# Patient Record
Sex: Male | Born: 1950 | Race: White | Hispanic: No | Marital: Married | Smoking: Current every day smoker
Health system: Southern US, Community
[De-identification: ages and names within clinical notes are randomized; demographics above are authoritative.]

## PROBLEM LIST (undated history)

## (undated) DIAGNOSIS — M1990 Unspecified osteoarthritis, unspecified site: Secondary | ICD-10-CM

## (undated) DIAGNOSIS — I255 Ischemic cardiomyopathy: Secondary | ICD-10-CM

## (undated) DIAGNOSIS — I2119 ST elevation (STEMI) myocardial infarction involving other coronary artery of inferior wall: Secondary | ICD-10-CM

## (undated) DIAGNOSIS — E785 Hyperlipidemia, unspecified: Secondary | ICD-10-CM

## (undated) DIAGNOSIS — J189 Pneumonia, unspecified organism: Secondary | ICD-10-CM

## (undated) DIAGNOSIS — K649 Unspecified hemorrhoids: Secondary | ICD-10-CM

## (undated) DIAGNOSIS — Z951 Presence of aortocoronary bypass graft: Secondary | ICD-10-CM

## (undated) DIAGNOSIS — J45909 Unspecified asthma, uncomplicated: Secondary | ICD-10-CM

## (undated) DIAGNOSIS — E669 Obesity, unspecified: Secondary | ICD-10-CM

## (undated) DIAGNOSIS — I10 Essential (primary) hypertension: Secondary | ICD-10-CM

## (undated) DIAGNOSIS — E66811 Obesity, class 1: Secondary | ICD-10-CM

## (undated) DIAGNOSIS — I2510 Atherosclerotic heart disease of native coronary artery without angina pectoris: Secondary | ICD-10-CM

## (undated) DIAGNOSIS — N200 Calculus of kidney: Secondary | ICD-10-CM

## (undated) HISTORY — DX: Obesity, class 1: E66.811

## (undated) HISTORY — DX: Obesity, unspecified: E66.9

## (undated) HISTORY — DX: Hyperlipidemia, unspecified: E78.5

## (undated) HISTORY — DX: Atherosclerotic heart disease of native coronary artery without angina pectoris: I25.10

## (undated) HISTORY — DX: Ischemic cardiomyopathy: I25.5

## (undated) HISTORY — DX: Unspecified hemorrhoids: K64.9

## (undated) HISTORY — DX: ST elevation (STEMI) myocardial infarction involving other coronary artery of inferior wall: I21.19

## (undated) HISTORY — DX: Presence of aortocoronary bypass graft: Z95.1

## (undated) HISTORY — PX: LITHOTRIPSY: SUR834

---

## 1996-01-21 HISTORY — PX: CORONARY ANGIOPLASTY WITH STENT PLACEMENT: SHX49

## 1999-03-14 DIAGNOSIS — Z951 Presence of aortocoronary bypass graft: Secondary | ICD-10-CM

## 1999-03-14 HISTORY — DX: Presence of aortocoronary bypass graft: Z95.1

## 1999-06-06 ENCOUNTER — Encounter: Payer: Self-pay | Admitting: *Deleted

## 1999-06-06 HISTORY — PX: CARDIAC CATHETERIZATION: SHX172

## 1999-06-07 ENCOUNTER — Encounter: Payer: Self-pay | Admitting: Thoracic Surgery (Cardiothoracic Vascular Surgery)

## 1999-06-07 ENCOUNTER — Inpatient Hospital Stay (HOSPITAL_COMMUNITY): Admission: AD | Admit: 1999-06-07 | Discharge: 1999-06-12 | Payer: Self-pay | Admitting: *Deleted

## 1999-06-07 HISTORY — PX: CORONARY ARTERY BYPASS GRAFT: SHX141

## 1999-06-08 ENCOUNTER — Encounter: Payer: Self-pay | Admitting: Thoracic Surgery (Cardiothoracic Vascular Surgery)

## 1999-06-09 ENCOUNTER — Encounter: Payer: Self-pay | Admitting: Thoracic Surgery (Cardiothoracic Vascular Surgery)

## 1999-06-10 ENCOUNTER — Encounter: Payer: Self-pay | Admitting: Thoracic Surgery (Cardiothoracic Vascular Surgery)

## 2001-10-04 HISTORY — PX: CARDIAC CATHETERIZATION: SHX172

## 2004-08-18 ENCOUNTER — Ambulatory Visit: Payer: Self-pay | Admitting: Urology

## 2004-08-24 ENCOUNTER — Other Ambulatory Visit: Payer: Self-pay

## 2004-08-25 ENCOUNTER — Ambulatory Visit: Payer: Self-pay | Admitting: Urology

## 2006-01-25 ENCOUNTER — Ambulatory Visit (HOSPITAL_COMMUNITY): Admission: RE | Admit: 2006-01-25 | Discharge: 2006-01-25 | Payer: Self-pay | Admitting: Urology

## 2009-08-19 HISTORY — PX: NM MYOVIEW LTD: HXRAD82

## 2012-03-13 DIAGNOSIS — J189 Pneumonia, unspecified organism: Secondary | ICD-10-CM

## 2012-03-13 HISTORY — DX: Pneumonia, unspecified organism: J18.9

## 2012-03-15 ENCOUNTER — Ambulatory Visit: Payer: Self-pay | Admitting: Anesthesiology

## 2012-04-01 ENCOUNTER — Other Ambulatory Visit (HOSPITAL_COMMUNITY): Payer: Self-pay | Admitting: Cardiovascular Disease

## 2012-04-01 DIAGNOSIS — R011 Cardiac murmur, unspecified: Secondary | ICD-10-CM

## 2012-04-01 DIAGNOSIS — I519 Heart disease, unspecified: Secondary | ICD-10-CM

## 2012-05-06 ENCOUNTER — Ambulatory Visit (HOSPITAL_COMMUNITY)
Admission: RE | Admit: 2012-05-06 | Discharge: 2012-05-06 | Disposition: A | Discharge status: Home / Self Care | Payer: BC Managed Care – PPO | Source: Ambulatory Visit | Attending: Cardiovascular Disease | Admitting: Cardiovascular Disease

## 2012-05-06 ENCOUNTER — Encounter (HOSPITAL_COMMUNITY): Payer: Self-pay

## 2012-05-06 DIAGNOSIS — E785 Hyperlipidemia, unspecified: Secondary | ICD-10-CM | POA: Insufficient documentation

## 2012-05-06 DIAGNOSIS — I079 Rheumatic tricuspid valve disease, unspecified: Secondary | ICD-10-CM | POA: Insufficient documentation

## 2012-05-06 DIAGNOSIS — I519 Heart disease, unspecified: Secondary | ICD-10-CM

## 2012-05-06 DIAGNOSIS — F17200 Nicotine dependence, unspecified, uncomplicated: Secondary | ICD-10-CM | POA: Insufficient documentation

## 2012-05-06 DIAGNOSIS — I359 Nonrheumatic aortic valve disorder, unspecified: Secondary | ICD-10-CM | POA: Insufficient documentation

## 2012-05-06 DIAGNOSIS — R011 Cardiac murmur, unspecified: Secondary | ICD-10-CM | POA: Insufficient documentation

## 2012-05-06 HISTORY — PX: TRANSTHORACIC ECHOCARDIOGRAM: SHX275

## 2012-05-06 HISTORY — PX: OTHER SURGICAL HISTORY: SHX169

## 2012-05-06 NOTE — Progress Notes (Signed)
2D Echo Performed 05/06/2012    Cera Rorke, RCS  

## 2012-05-06 NOTE — Progress Notes (Signed)
Carotid duplex complete Maxwell Caul

## 2013-02-10 ENCOUNTER — Telehealth: Payer: Self-pay | Admitting: *Deleted

## 2013-02-10 MED ORDER — RAMIPRIL 5 MG PO CAPS: 5.0000 mg | ORAL_CAPSULE | Freq: Every day | ORAL | Status: DC

## 2013-02-10 NOTE — Telephone Encounter (Signed)
Rx was sent to pharmacy electronically. Left VM with info regarding refill at [facility] - on file in paper chart

## 2013-02-10 NOTE — Telephone Encounter (Signed)
Pt is out of Ramipril 5 mg. Pt wife stated that he is out of his medication and she called the drug store and they told her to call us for a 30 day supply.

## 2013-02-26 ENCOUNTER — Ambulatory Visit: Payer: BC Managed Care – PPO | Admitting: Cardiology

## 2013-03-11 ENCOUNTER — Other Ambulatory Visit: Payer: Self-pay | Admitting: Cardiology

## 2013-03-11 NOTE — Telephone Encounter (Signed)
Rx was sent to pharmacy electronically. 

## 2013-03-14 ENCOUNTER — Encounter: Payer: Self-pay | Admitting: Cardiology

## 2013-03-14 ENCOUNTER — Ambulatory Visit (INDEPENDENT_AMBULATORY_CARE_PROVIDER_SITE_OTHER): Payer: BC Managed Care – PPO | Admitting: Cardiology

## 2013-03-14 VITALS — BP 140/70 | HR 65 | Ht 68.0 in | Wt 218.2 lb

## 2013-03-14 DIAGNOSIS — F17200 Nicotine dependence, unspecified, uncomplicated: Secondary | ICD-10-CM

## 2013-03-14 DIAGNOSIS — Z79899 Other long term (current) drug therapy: Secondary | ICD-10-CM

## 2013-03-14 DIAGNOSIS — I2589 Other forms of chronic ischemic heart disease: Secondary | ICD-10-CM

## 2013-03-14 DIAGNOSIS — E785 Hyperlipidemia, unspecified: Secondary | ICD-10-CM

## 2013-03-14 DIAGNOSIS — I255 Ischemic cardiomyopathy: Secondary | ICD-10-CM

## 2013-03-14 DIAGNOSIS — I2510 Atherosclerotic heart disease of native coronary artery without angina pectoris: Secondary | ICD-10-CM

## 2013-03-14 DIAGNOSIS — F17210 Nicotine dependence, cigarettes, uncomplicated: Secondary | ICD-10-CM

## 2013-03-14 DIAGNOSIS — E669 Obesity, unspecified: Secondary | ICD-10-CM

## 2013-03-14 LAB — LIPID PANEL
CHOLESTEROL: 117 mg/dL (ref 0–200)
HDL: 34 mg/dL — AB (ref >39)
LDL Cholesterol: 60 mg/dL (ref 0–99)
TRIGLYCERIDES: 113 mg/dL (ref <150)
Total CHOL/HDL Ratio: 3.4 Ratio
VLDL: 23 mg/dL (ref 0–40)

## 2013-03-14 LAB — COMPREHENSIVE METABOLIC PANEL
ALT: 20 U/L (ref 0–53)
AST: 14 U/L (ref 0–37)
Albumin: 4.2 g/dL (ref 3.5–5.2)
Alkaline Phosphatase: 72 U/L (ref 39–117)
BUN: 18 mg/dL (ref 6–23)
CALCIUM: 9.2 mg/dL (ref 8.4–10.5)
CHLORIDE: 103 meq/L (ref 96–112)
CO2: 25 meq/L (ref 19–32)
Creat: 0.97 mg/dL (ref 0.50–1.35)
Glucose, Bld: 103 mg/dL — ABNORMAL HIGH (ref 70–99)
Potassium: 4.7 mEq/L (ref 3.5–5.3)
Sodium: 138 mEq/L (ref 135–145)
Total Bilirubin: 0.4 mg/dL (ref 0.3–1.2)
Total Protein: 7.1 g/dL (ref 6.0–8.3)

## 2013-03-14 MED ORDER — RAMIPRIL 5 MG PO CAPS: ORAL_CAPSULE | ORAL | Status: DC

## 2013-03-14 MED ORDER — METOPROLOL TARTRATE 50 MG PO TABS: 25.0000 mg | ORAL_TABLET | Freq: Two times a day (BID) | ORAL | Status: DC

## 2013-03-14 MED ORDER — TAMSULOSIN HCL 0.4 MG PO CAPS: 0.4000 mg | ORAL_CAPSULE | Freq: Every day | ORAL | Status: DC

## 2013-03-14 NOTE — Patient Instructions (Signed)
Continue with current medication.  LABS -CMP,LIPID   Your physician wants you to follow-up in 6 MONTHS Dr Ellyn Hack.  You will receive a reminder letter in the mail two months in advance. If you don't receive a letter, please call our office to schedule the follow-up appointment.

## 2013-03-16 ENCOUNTER — Encounter: Payer: Self-pay | Admitting: Cardiology

## 2013-03-16 DIAGNOSIS — F17210 Nicotine dependence, cigarettes, uncomplicated: Secondary | ICD-10-CM | POA: Insufficient documentation

## 2013-03-16 DIAGNOSIS — I255 Ischemic cardiomyopathy: Secondary | ICD-10-CM | POA: Insufficient documentation

## 2013-03-16 DIAGNOSIS — E669 Obesity, unspecified: Secondary | ICD-10-CM | POA: Insufficient documentation

## 2013-03-16 DIAGNOSIS — E785 Hyperlipidemia, unspecified: Secondary | ICD-10-CM | POA: Insufficient documentation

## 2013-03-16 NOTE — Assessment & Plan Note (Signed)
This may be his Achilles heel. He does not seem to be interested in smoking cessation. I did, several options including patches and the E-cigarettes, versus simply just stopping cold [place]. He seemed to be agreeable to trying to cut down his smoking by half for the next visit. At least 5 minutes was spent talking about this issue.

## 2013-03-16 NOTE — Assessment & Plan Note (Signed)
We talked about adjusting his diet reducing a carbohydrate intake as well as fatty food intake. Mostly simply be reducing his meals. Quite active, but is not getting routine exercise. I recommended based on the weekends he does do some walking.

## 2013-03-16 NOTE — Assessment & Plan Note (Addendum)
Completely a symptomatic from from an angina or heart failure standpoint. On a stable regimen of beta blocker, ACE inhibitor as well as aspirin and statin.  He'll be due for a followup stress test that can be ordered at the time of his next visit. He preferred to have studies done in the wintertime when work is less busy.

## 2013-03-16 NOTE — Assessment & Plan Note (Signed)
He is on Vytorin, has been doing very well since 2001. His most recent set of labs from a year ago demonstrated that he was very well controlled. He is due for routine followup lipid panel and chemistry panel. Otherwise we'll make no changes.

## 2013-03-16 NOTE — Assessment & Plan Note (Signed)
Moderately reduced EF, no signs symptoms of heart failure. He is on a good regimen of beta blocker and ACE inhibitor. Not requiring any diuretic. His EF was mildly reduced on the most recent echocardiogram as compared to 2012, may basically making his EF more consistent with being 45%.

## 2013-03-16 NOTE — Progress Notes (Signed)
PATIENT: Benjamin Robertson MRN: [id number]  DOB: [birth year]   DOV:03/16/2013 PCP: No PCP Per Patient  Clinic Note: Chief Complaint  Patient presents with  . ROV 1 year RAW-DH    No complaints.    HPI: Benjamin Robertson is a 63 y.o.  male with a PMH below who presents today for what amounts to be a one-year followup for cardiology care to reestablish a new cardiologist at the retirement of his former cardiologist Dr. Rollene Fare. Prior to Dr. Rollene Fare, he was a patient of Dr. Tami Ribas. He was last seen in October 2013. He did have an echocardiogram performed in February of 2014 revealing moderately reduced EF of 40-45% which is relatively stable for him.  Interval History: He a relatively well for the last 14 months with no major complaints. He has a little a baseline exertional dyspnea but nothing significant. He denies any active symptoms of angina with rest or exertion. No heart failure symptoms to go along with his low EF including no PND, orthopnea or edema. No lightheadedness, dizziness or wooziness. No rapid or irregular heartbeats/palpitations. No syncope/near syncope or TIA/amaurosis fugax symptoms. No melena, hematochezia or hematuria.  He does a lot of heavy lifting and activity during work, but does not get routine exercise. With all activity, he denies any symptoms.  Past Medical History  Diagnosis Date  . History of ST elevation myocardial infarction (STEMI) of inferior wall 1994; 1997    PCI of RCA - redo PCI BMS to RCA in 1997  . CAD in native artery 1994, 2001    Cath For abnormal Myoview for Exertional Angina -- Referred to Dr. Roxy Manns for CABG: LIMA-LAD, SVG-diagonal, SVG-Ramus Intermedius; patent by cath in 2003  . Ischemic cardiomyopathy 2003    Most recent echo February 2014: EF 40-45%; moderate HK of anterolateral myocardium.; Grade 1 diastolic dysfunction  . Hyperlipidemia LDL goal <70     At goal by Most recent labs October 2013: TC 123, TG 83, HDL 81, LDL 65  . Cigarette smoker one  half pack a day or less      over 40 years  . H/O gastroesophageal reflux (GERD)   . Obesity (BMI 30.0-34.9)     Prior Cardiac Evaluation and Past Surgical History: Past Surgical History  Procedure Laterality Date  . Cardiac catheterization  01/21/1996    PTCA of RCA-95% lesion  . Cardiac catheterization  06/06/1999    Recommend CABG  . Coronary artery bypass graft  06/07/1999    x3. LIMA to distal LAD, SVG to first diag, and SVG to ramus  . Cardiac catheterization  10/04/2001    Patent grafts with a 50% lesion in LAD beyond IMA insertion  . Nm myoview ltd  08/19/2009    Moderafte perfusion seen in Basal inferoseptal, Basal inferior, Mid inferoseptal, Mid inferiorm, and Apical inferior regions. No ECG changes. EKG negative for ischemia.  . Transthoracic echocardiogram  05/06/2012    EF 40-45%, LV cavity moderately dilated, systolic function mild-moderately reduced, moderate hypokinesis of anterolatewral myocardium.  . Carotid doppler  05/06/2012    Less than 50% diameter reduction of the Lft Subclavian.    No Known Allergies  Current Outpatient Prescriptions  Medication Sig Dispense Refill  . aspirin 325 MG tablet Take 325 mg by mouth daily.      Marland Kitchen ezetimibe-simvastatin (VYTORIN) 10-40 MG per tablet Take 1 tablet by mouth daily.      . metoprolol (LOPRESSOR) 50 MG tablet Take 0.5 tablets (25 mg total) by  mouth 2 (two) times daily.  90 tablet  3  . naproxen sodium (ANAPROX) 220 MG tablet Take 220 mg by mouth as needed.      . nitroGLYCERIN (NITROLINGUAL) 0.4 MG/SPRAY spray Place 1 spray under the tongue every 5 (five) minutes x 3 doses as needed for chest pain.      Marland Kitchen Potassium Citrate 15 MEQ (1620 MG) TBCR Take 1 tablet by mouth 2 (two) times daily.      . ramipril (ALTACE) 5 MG capsule TAKE 1 CAPSULE (5 MG TOTAL) BY MOUTH DAILY.  90 capsule  3  . tamsulosin (FLOMAX) 0.4 MG CAPS capsule Take 1 capsule (0.4 mg total) by mouth daily.  90 capsule  3   No current facility-administered  medications for this visit.    History   Social History Narrative   Married father of 2 with one grandchild. By his wife.   Continues to smoke one half pack a day.   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   Very physically active at work but no routine exercise.    ROS: A comprehensive Review of Systems - Negative except Mild arthralgias from osteoarthritis-type discomfort. Mostly his back and hips for which he takes occasional naproxen.  PHYSICAL EXAM BP 140/70  Pulse 65  Ht 5\' 8"  (1.727 m)  Wt 218 lb 3.2 oz (98.975 kg)  BMI 33.18 kg/m2 General appearance: alert and oriented x3, cooperative, appears stated age, no distress and well-nourished and well-groomed. It is questions appropriately. HEENT: [place]/AT, EOMI, MMM, anicteric sclera Neck: no adenopathy, no JVD, supple, symmetrical, trachea midline and soft bilateral bruits. Lungs: clear to auscultation bilaterally, normal percussion bilaterally and increased AP diameter. No increased worker breathing. No B./R./R. Heart: normal apical impulse, regular rate and rhythm and normal S1 and S2. Soft S4. 1/6 SEM at RUSB. No R./G. Abdomen: soft, non-tender; bowel sounds normal; no masses,  no organomegaly Extremities: extremities normal, atraumatic, no cyanosis or edema Pulses: 2+ and symmetric Neurologic: Alert and oriented X 3, normal strength and tone. Normal symmetric reflexes. Normal coordination and gait  WYO:VZCHYIFOY today: Yes Rate:65 , Rhythm: NSR; normal EKG.  Recent Labs: None for the past year.  ASSESSMENT / PLAN: CAD in native artery - status post CABG x3 after PCI x2 to the RCA (LIMA-LAD, SVG-diagonal SVG-RI Completely a symptomatic from from an angina or heart failure standpoint. On a stable regimen of beta blocker, ACE inhibitor as well as aspirin and statin.  He'll be due for a followup stress test that can be ordered at the time of his next visit. He  preferred to have studies done in the wintertime when work is less busy.  Ischemic cardiomyopathy Moderately reduced EF, no signs symptoms of heart failure. He is on a good regimen of beta blocker and ACE inhibitor. Not requiring any diuretic. His EF was mildly reduced on the most recent echocardiogram as compared to 2012, may basically making his EF more consistent with being 45%.  Hyperlipidemia LDL goal <70 He is on Vytorin, has been doing very well since 2001. His most recent set of labs from a year ago demonstrated that he was very well controlled. He is due for routine followup lipid panel and chemistry panel. Otherwise we'll make no changes.  Obesity (BMI 30.0-34.9) We talked about adjusting his diet reducing a carbohydrate intake as well as fatty food intake. Mostly simply be reducing his meals. Quite active, but is not  getting routine exercise. I recommended based on the weekends he does do some walking.  Cigarette smoker one half pack a day or less This may be his Achilles heel. He does not seem to be interested in smoking cessation. I did, several options including patches and the E-cigarettes, versus simply just stopping cold [place]. He seemed to be agreeable to trying to cut down his smoking by half for the next visit. At least 5 minutes was spent talking about this issue.    Orders Placed This Encounter  Procedures  . Lipid panel    Order Specific Question:  Has the patient fasted?    Answer:  Yes  . Comprehensive metabolic panel    Order Specific Question:  Has the patient fasted?    Answer:  Yes  . EKG 12-Lead  . EKG 12-Lead    This order was created through External Result Entry    All cardiac medications refilled for 90 day supply  Followup: 6 months   DAVID W. Ellyn Hack, M.D., M.S. [facility] [address] [place], [place]  [address]  [phone] Pager # [phone]

## 2013-03-28 ENCOUNTER — Telehealth: Payer: Self-pay | Admitting: *Deleted

## 2013-03-28 NOTE — Telephone Encounter (Signed)
Message copied by Raiford Simmonds on Fri Mar 28, 2013 12:10 PM ------      Message from: Leonie Man      Created: Sat Mar 15, 2013  8:50 PM       Total Cholesterol, LDL & Triglycerides - all look great; HDL is low, but likely related to low Total Cholesterol.            Glucose is elevated -- 103; would recommend checking HgbA1c with next labs.  ? Insulin ?Resistance            Leonie Man, MD       ------

## 2013-03-28 NOTE — Telephone Encounter (Signed)
Left message to call back about labs results

## 2013-04-03 NOTE — Telephone Encounter (Signed)
Returning your call from last Friday.

## 2013-04-07 NOTE — Telephone Encounter (Signed)
SPOKE TO WIFE RESULTS GIVEN, PER DR HARDING- RECOMMEND HGA1C at his next lab work. Wife states patient does not have a PCP. She will try to get him to go to her  PCP at [facility].

## 2013-04-09 ENCOUNTER — Telehealth: Payer: Self-pay | Admitting: Cardiology

## 2013-04-09 NOTE — Telephone Encounter (Signed)
Wife wanted to know if a letter can be written for Benjamin Robertson for jury duty on 05/22/13.  Wife states in the past he has received a letter. She states he becomes very anxious situation. Informed wife that will have to defer to Dr Ellyn Hack---- And will call her back

## 2013-04-09 NOTE — Telephone Encounter (Signed)
Need to speak to you about getting him out of jury duty , because he has extreme anxiety  .Marland Kitchen When he has to go to these . Please Call

## 2013-04-10 NOTE — Telephone Encounter (Signed)
Spoke to wife.informed her that Dr Ellyn Hack will not be able to do a letter

## 2013-04-10 NOTE — Telephone Encounter (Signed)
I would love to help - but I think this is something better suited for a PCP.  I don't think anxiety gets you out of jury duty - I wish it could.  His PCP should have my latest note to reference.  Leonie Man, MD

## 2013-05-26 ENCOUNTER — Telehealth: Payer: Self-pay | Admitting: Cardiology

## 2013-05-26 MED ORDER — EZETIMIBE-SIMVASTATIN 10-40 MG PO TABS: 1.0000 | ORAL_TABLET | Freq: Every day | ORAL | Status: DC

## 2013-05-26 NOTE — Telephone Encounter (Signed)
He needs a new prescription for Vytorin 10/40 #90

## 2013-05-26 NOTE — Telephone Encounter (Signed)
Rx was sent to pharmacy electronically. 

## 2013-10-14 ENCOUNTER — Telehealth: Payer: Self-pay | Admitting: Cardiology

## 2013-10-14 ENCOUNTER — Other Ambulatory Visit: Payer: Self-pay | Admitting: *Deleted

## 2013-10-14 MED ORDER — EZETIMIBE-SIMVASTATIN 10-40 MG PO TABS: 1.0000 | ORAL_TABLET | Freq: Every day | ORAL | Status: DC

## 2013-10-14 NOTE — Telephone Encounter (Signed)
[name] is calling about [name] 10/140mg  .. And its in a Tier 3 and its has gone up very high in cost and wanting to know if there is something else that he could take . Please call  Thanks

## 2013-10-14 NOTE — Telephone Encounter (Signed)
Returned a call to patient's wife. She  Informs me that the cost of the patient's vytorin for a 90 day supply will be going up drastically. Requests alternative. Informed her that I can give  her a [employer] card where they will possibly only pay  $25/month. She requests for me to mail it to their home address. She will read it and try to see if they can use it. If not she will call back to advise and get further recommendations.

## 2013-10-20 ENCOUNTER — Telehealth: Payer: Self-pay | Admitting: Cardiology

## 2013-10-20 NOTE — Telephone Encounter (Signed)
New Prob    Requesting prior authorization or medication alternative for Vytorin. Please call.

## 2013-10-20 NOTE — Telephone Encounter (Signed)
Discussed with the pharm, called and spoke with the pts wife. If we split the vytorin into two separate scripts the zetia would be $35 and the simvastatin would be their generic co-pay. The pts wife reports they have enough vytorin to last until his appt with dr harding. They will wait and discuss at appt time. Will make the pharm aware

## 2013-10-21 ENCOUNTER — Telehealth: Payer: Self-pay | Admitting: *Deleted

## 2013-10-21 NOTE — Telephone Encounter (Signed)
RN spoke to wife.  RN attempted --- prior authorization for Vytorin.  patient has tried LIPITOR/ATORVASTATIN, ZOCOR/ SIMVASTATIN, ADIVCOR past the last 2 has intolerance with taking by themselves. Patient has been on [place] 2/ 2006 WITH OUT ANY PROBLEMS.  PER RX OPTIONS  REP. Hassell Done) patient has to try and failed atorvastatin, pravastatin, lovastatin, simvastatin (step therapy) before vytorin is considered. Rep. did not have a number to get paper form. RN called patient.  Patient at work , spoke to wife. RN informed wife of situation.possible solution could be to split medication two separate pills - to see the cost factor. She states patient has enough medication until next appointment with Dr Ellyn Hack. She states they will discuss it with him at that time.  NOTIFED PHARMACY TO PLACE REORDER ON HOLD (SPOKE TO PHARM ?BENA)

## 2013-10-27 NOTE — Telephone Encounter (Signed)
Will look to discuss this in clinic.  Leonie Man, MD

## 2013-12-11 ENCOUNTER — Encounter: Payer: Self-pay | Admitting: Cardiology

## 2013-12-11 ENCOUNTER — Ambulatory Visit (INDEPENDENT_AMBULATORY_CARE_PROVIDER_SITE_OTHER): Payer: BC Managed Care – PPO | Admitting: Cardiology

## 2013-12-11 VITALS — BP 130/74 | HR 75 | Ht 68.0 in | Wt 216.4 lb

## 2013-12-11 DIAGNOSIS — I255 Ischemic cardiomyopathy: Secondary | ICD-10-CM

## 2013-12-11 DIAGNOSIS — Z79899 Other long term (current) drug therapy: Secondary | ICD-10-CM

## 2013-12-11 DIAGNOSIS — E785 Hyperlipidemia, unspecified: Secondary | ICD-10-CM

## 2013-12-11 DIAGNOSIS — I2510 Atherosclerotic heart disease of native coronary artery without angina pectoris: Secondary | ICD-10-CM

## 2013-12-11 DIAGNOSIS — I10 Essential (primary) hypertension: Secondary | ICD-10-CM

## 2013-12-11 DIAGNOSIS — E669 Obesity, unspecified: Secondary | ICD-10-CM

## 2013-12-11 DIAGNOSIS — F17210 Nicotine dependence, cigarettes, uncomplicated: Secondary | ICD-10-CM

## 2013-12-11 DIAGNOSIS — E66811 Obesity, class 1: Secondary | ICD-10-CM

## 2013-12-11 DIAGNOSIS — I2119 ST elevation (STEMI) myocardial infarction involving other coronary artery of inferior wall: Secondary | ICD-10-CM

## 2013-12-11 MED ORDER — SIMVASTATIN 40 MG PO TABS: 40.0000 mg | ORAL_TABLET | Freq: Every day | ORAL | Status: DC

## 2013-12-11 NOTE — Patient Instructions (Addendum)
Your physician has requested that you have a lexiscan myoview. For further information please visit [email or web address]. Please follow instruction sheet, as given. SCHEDULE FOR JAN 2016  STOP VYTORIN- DUE TO INCREASE OF COST WITH INSURANCE   START SIMVASTATN  LABS IN JAN 2016-CMP ,LIPID  Your physician wants you to follow-up in MARCH 2016.  You will receive a reminder letter in the mail two months in advance. If you don't receive a letter, please call our office to schedule the follow-up appointment.

## 2013-12-13 ENCOUNTER — Encounter: Payer: Self-pay | Admitting: Cardiology

## 2013-12-13 DIAGNOSIS — I444 Left anterior fascicular block: Secondary | ICD-10-CM | POA: Insufficient documentation

## 2013-12-13 DIAGNOSIS — I10 Essential (primary) hypertension: Secondary | ICD-10-CM | POA: Insufficient documentation

## 2013-12-13 DIAGNOSIS — I2119 ST elevation (STEMI) myocardial infarction involving other coronary artery of inferior wall: Secondary | ICD-10-CM | POA: Insufficient documentation

## 2013-12-13 NOTE — Assessment & Plan Note (Signed)
He seems to be relatively asymptomatic except for some mild exertional dyspnea. With his last stress testing 4 years ago, but to recheck surveillance Myoview prior to his followup visit in 6 months to ensure there's no progression of disease or in-stent stenosis in the RCA. Otherwise he is on statin beta blocker and ACE inhibitor as well as aspirin. We can reduce his aspirin to 81 mg or one half of a 325 mg tablet.  Plan: LexiScan Myoview -- unable to walk on treadmill for previous stress tests.

## 2013-12-13 NOTE — Assessment & Plan Note (Signed)
Stable weights. Again discussed importance of dietary modification and continued exercise.

## 2013-12-13 NOTE — Assessment & Plan Note (Signed)
I talked for several minutes today about the importance of smoking cessation. He seemed a bit lip service to allow saying that did not seem overly interested in prostate quitting. He half heartedly agreed that he would cut that back.

## 2013-12-13 NOTE — Progress Notes (Signed)
PCP: No PCP Per Patient  Clinic Note: Chief Complaint  Patient presents with  . 9 MONTH VISIT    NO CHEST PAIN , NO SOB , NO EDEMA--ISSUEWITH GETTING BRAND CHOLESTEROL MED    HPI: Benjamin Robertson is a 63 y.o. male with a PMH below who presents today for delayed six-month followup for CAD with moderate ischemic cardiomyopathy. He is a former patient of Dr. Tami Ribas from the time of his initial MI in 63. who then saw Dr. Terance Ice until his retirement. I saw him for the first time in January of 2015. He had a second MI in '97, but finally was referred for CABG in 2001. His last stress test was in 2011 that did not show evidence of ischemia. There was evidence of a inferior infarct, but no ischemia.  Past Medical History  Diagnosis Date  . History of ST elevation myocardial infarction (STEMI) of inferior wall 1994; 1997    PCI of RCA - redo PCI BMS ML Vision 3.0 mm x 25 mm to RCA in 1997  . CAD in native artery 1994, 2001    a) 2001 Exertional Angina --> Myoview: Inf infarct and Ant ischemia --> Cath: 95% pLAD, 99% D1, 80% RI, but Cx-OM1&2 OK , & patent RCA stent --> CABG: b) Cath in 2003 patent grafts, RCA & Cx; c) 08/2009 Myoview: Mod Basal-Mid inferoseptal, Basal-apical inferior Infarct. No ischemia.   . S/P CABG x 3 2001    LIMA-LAD, SVG-diagonal, SVG-Ramus Intermedius (RI) - patent by Cath in 2003  . Ischemic cardiomyopathy 1994, 2003    Echo February 2014: EF 40-45%; moderate HK of anterolateral myocardium.; Grade 1 diastolic dysfunction  . Cigarette smoker one half pack a day or less      over 40 years  . Hyperlipidemia LDL goal <70     At goal by Most recent labs October 2013: TC 123, TG 83, HDL 81, LDL 65  . Obesity (BMI 30.0-34.9)   . H/O gastroesophageal reflux (GERD)    Interval History: DD presents today as well no major complaints. 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 He's out in the heat all day. After being in the heat for the whole day  he may feel a little bit tired and may have to stop to catch his breath some but does not have any sensation of exertional anginal chest tightness or pressure. No resting dyspnea but does have some mild exertional dyspnea. He doesn't really get any routine exercise is always on the go at work. No PND, orthopnea or edema. No palpitations, lightheadedness, dizziness, weakness or syncope/near syncope.  On side note, he just got a letter from his insurance company stating that his Vytorin is now to start costing a significant amount more than it had in the past. He continues to smoke about half pack a day and does not seem to be overly interested in stopping.  ROS: A comprehensive Review of Systems - was performed Review of Systems  Constitutional: Negative.   HENT: Negative for congestion and nosebleeds.   Eyes: Negative for blurred vision and double vision.  Respiratory: Negative for cough, hemoptysis, sputum production, shortness of breath and wheezing.   Cardiovascular: Negative.  Negative for claudication.       Otherwise negative per history of present illness  Gastrointestinal: Negative for blood in stool and melena.  Genitourinary: Negative for hematuria and flank pain.  Musculoskeletal:       Normal expected arthralgias.  Neurological: Negative for dizziness, sensory change, speech change, focal weakness, seizures and loss of consciousness.  Endo/Heme/Allergies: Negative.  Does not bruise/bleed easily.  Psychiatric/Behavioral: Negative for depression. The patient is not nervous/anxious.   All other systems reviewed and are negative.   Current Outpatient Prescriptions on File Prior to Visit  Medication Sig Dispense Refill  . aspirin 325 MG tablet Take 325 mg by mouth daily.      . metoprolol (LOPRESSOR) 50 MG tablet Take 0.5 tablets (25 mg total) by mouth 2 (two) times daily.  90 tablet  3  . naproxen sodium (ANAPROX) 220 MG tablet Take 220 mg by mouth as needed.      . nitroGLYCERIN  (NITROLINGUAL) 0.4 MG/SPRAY spray Place 1 spray under the tongue every 5 (five) minutes x 3 doses as needed for chest pain.      Marland Kitchen Potassium Citrate 15 MEQ (1620 MG) TBCR Take 1 tablet by mouth 2 (two) times daily.      . ramipril (ALTACE) 5 MG capsule TAKE 1 CAPSULE (5 MG TOTAL) BY MOUTH DAILY.  90 capsule  3  . tamsulosin (FLOMAX) 0.4 MG CAPS capsule Take 1 capsule (0.4 mg total) by mouth daily.  90 capsule  3   No current facility-administered medications on file prior to visit.   ALLERGIES REVIEWED IN EPIC -- No change SOCIAL AND FAMILY HISTORY REVIEWED IN EPIC -- No change PSH REVIEWED IN EPIC  Wt Readings from Last 3 Encounters:  12/11/13 216 lb 6.4 oz (98.158 kg)  03/14/13 218 lb 3.2 oz (98.975 kg)    PHYSICAL EXAM BP 130/74  Pulse 75  Ht 5\' 8"  (1.727 m)  Wt 216 lb 6.4 oz (98.158 kg)  BMI 32.91 kg/m2 General appearance: alert and oriented x3, cooperative, appears stated age, no distress and well-nourished and well-groomed. It is questions appropriately.  HEENT: [place]/AT, EOMI, MMM, anicteric sclera  Neck: no adenopathy, no JVD, supple, symmetrical, trachea midline and soft bilateral bruits.  Lungs: CTAB, normal percussion bilaterally and increased AP diameter. No increased worker breathing. NoW./R./R.  Heart: normal apical impulse, RRR, normal S1 and S2. Soft S4. 1/6 SEM at RUSB. No R./G.  Abdomen: soft, non-tender; bowel sounds normal; no masses, no organomegaly, no HJR Extremities: extremities normal, atraumatic, no cyanosis or edema  Pulses: 2+ and symmetric  Neurologic: Alert and oriented X 3, normal strength and tone. Grossly normal   Adult ECG Report  Rate: 75 ;  Rhythm: normal sinus rhythm with PVCs  Narrative Interpretation: stable EKG  Recent Labs:  None since January. At that time his lipid panel was quite well controlled.   TC 117, TG 113, HDL 34, LDL 60 ; glucose 103   ASSESSMENT / PLAN: CAD in native artery - status post CABG x3 after PCI x2 to the RCA  (LIMA-LAD, SVG-diagonal SVG-RI He seems to be relatively asymptomatic except for some mild exertional dyspnea. With his last stress testing 4 years ago, but to recheck surveillance Myoview prior to his followup visit in 6 months to ensure there's no progression of disease or in-stent stenosis in the RCA. Otherwise he is on statin beta blocker and ACE inhibitor as well as aspirin. We can reduce his aspirin to 81 mg or one half of a 325 mg tablet.  Plan: LexiScan Myoview -- unable to walk on treadmill for previous stress tests.  History of ST elevation myocardial infarction (STEMI) of inferior wall For someone who has had 2 MIs in the past, he is quite familiar with  his anginal symptoms. He does have confirmed infarct on Myoview. There is likely to be mild peri-infarct ischemia. I will only react to a significant ischemic burden on Myoview.  Ischemic cardiomyopathy Only moderately reduced EF by echocardiogram. He is on a good dose of beta blocker and ACE inhibitor. No heart failure symptoms. No requirement for diuretics.  Cigarette smoker one half pack a day or less I talked for several minutes today about the importance of smoking cessation. He seemed a bit lip service to allow saying that did not seem overly interested in prostate quitting. He half heartedly agreed that he would cut that back.  Hyperlipidemia LDL goal <70 Currently on Vytorin. That seems now being the issue with his insurance. We will recheck his lipids and chemistry panel. Depending on how closely they ago switched to just simvastatin alone. If not, we may need to change into a more potent statin.   Essential hypertension Relatively well-controlled on moderate dose of beta blocker and ACE inhibitor.  Obesity (BMI 30.0-34.9) Stable weights. Again discussed importance of dietary modification and continued exercise.    Orders Placed This Encounter  Procedures  . Lipid panel    Standing Status: Future     Number of  Occurrences:      Standing Expiration Date: 12/12/2014    Order Specific Question:  Has the patient fasted?    Answer:  Yes  . Comprehensive metabolic panel    Standing Status: Future     Number of Occurrences:      Standing Expiration Date: 12/12/2014    Order Specific Question:  Has the patient fasted?    Answer:  Yes  . Myocardial Perfusion Imaging    Standing Status: Future     Number of Occurrences:      Standing Expiration Date: 12/11/2014    Order Specific Question:  Where should this test be performed    Answer:  [facility]    Order Specific Question:  Type of stress    Answer:  Lexiscan    Order Specific Question:  Patient weight in lbs    Answer:  216  . EKG 12-Lead   Meds ordered this encounter  Medications  . simvastatin (ZOCOR) 40 MG tablet    Sig: Take 1 tablet (40 mg total) by mouth at bedtime.    Dispense:  90 tablet    Refill:  3   Followup: 6 months (after Myoview)   Leonie Man, M.D., M.S. Interventional Cardiologist   Pager # [phone]

## 2013-12-13 NOTE — Assessment & Plan Note (Signed)
Relatively well-controlled on moderate dose of beta blocker and ACE inhibitor.

## 2013-12-13 NOTE — Assessment & Plan Note (Signed)
Only moderately reduced EF by echocardiogram. He is on a good dose of beta blocker and ACE inhibitor. No heart failure symptoms. No requirement for diuretics.

## 2013-12-13 NOTE — Assessment & Plan Note (Signed)
For someone who has had 2 MIs in the past, he is quite familiar with his anginal symptoms. He does have confirmed infarct on Myoview. There is likely to be mild peri-infarct ischemia. I will only react to a significant ischemic burden on Myoview.

## 2013-12-13 NOTE — Assessment & Plan Note (Signed)
Currently on Vytorin. That seems now being the issue with his insurance. We will recheck his lipids and chemistry panel. Depending on how closely they ago switched to just simvastatin alone. If not, we may need to change into a more potent statin.

## 2013-12-26 ENCOUNTER — Encounter (HOSPITAL_COMMUNITY): Payer: BC Managed Care – PPO

## 2014-03-19 ENCOUNTER — Telehealth: Payer: Self-pay | Admitting: *Deleted

## 2014-03-19 DIAGNOSIS — E785 Hyperlipidemia, unspecified: Secondary | ICD-10-CM

## 2014-03-19 DIAGNOSIS — Z79899 Other long term (current) drug therapy: Secondary | ICD-10-CM

## 2014-03-19 NOTE — Telephone Encounter (Signed)
Mail and letter cmp ,lipid  to due in  FEB 2016

## 2014-03-19 NOTE — Telephone Encounter (Signed)
-----   Message from Raiford Simmonds, RN sent at 12/11/2013 11:00 AM EDT ----- LABS CMP [facility] Cirri MAIL END OF JAN 2016 DUE IN FEB 2016

## 2014-03-20 ENCOUNTER — Encounter (HOSPITAL_COMMUNITY): Payer: Self-pay | Admitting: *Deleted

## 2014-03-22 ENCOUNTER — Other Ambulatory Visit: Payer: Self-pay | Admitting: Cardiology

## 2014-03-23 NOTE — Telephone Encounter (Signed)
Rx refill sent to patient pharmacy   

## 2014-03-24 ENCOUNTER — Encounter (HOSPITAL_COMMUNITY): Payer: BC Managed Care – PPO

## 2014-03-27 ENCOUNTER — Telehealth (HOSPITAL_COMMUNITY): Payer: Self-pay

## 2014-03-27 NOTE — Telephone Encounter (Signed)
Encounter complete. 

## 2014-04-01 ENCOUNTER — Encounter (HOSPITAL_COMMUNITY): Payer: BLUE CROSS/BLUE SHIELD

## 2014-04-05 ENCOUNTER — Other Ambulatory Visit: Payer: Self-pay | Admitting: Cardiology

## 2014-04-05 NOTE — Telephone Encounter (Signed)
Rx(s) sent to pharmacy electronically.  

## 2014-04-08 LAB — COMPREHENSIVE METABOLIC PANEL
ALBUMIN: 4 g/dL (ref 3.5–5.2)
ALK PHOS: 64 U/L (ref 39–117)
ALT: 16 U/L (ref 0–53)
AST: 13 U/L (ref 0–37)
BUN: 17 mg/dL (ref 6–23)
CO2: 23 mEq/L (ref 19–32)
Calcium: 9.3 mg/dL (ref 8.4–10.5)
Chloride: 107 mEq/L (ref 96–112)
Creat: 0.97 mg/dL (ref 0.50–1.35)
Glucose, Bld: 101 mg/dL — ABNORMAL HIGH (ref 70–99)
Potassium: 4.8 mEq/L (ref 3.5–5.3)
SODIUM: 139 meq/L (ref 135–145)
TOTAL PROTEIN: 6.6 g/dL (ref 6.0–8.3)
Total Bilirubin: 0.4 mg/dL (ref 0.2–1.2)

## 2014-04-09 ENCOUNTER — Encounter: Payer: Self-pay | Admitting: *Deleted

## 2014-07-06 ENCOUNTER — Encounter: Payer: Self-pay | Admitting: Cardiology

## 2014-07-06 ENCOUNTER — Ambulatory Visit (INDEPENDENT_AMBULATORY_CARE_PROVIDER_SITE_OTHER): Payer: BLUE CROSS/BLUE SHIELD | Admitting: Cardiology

## 2014-07-06 VITALS — BP 124/72 | HR 61 | Ht 68.0 in | Wt 216.6 lb

## 2014-07-06 DIAGNOSIS — I2510 Atherosclerotic heart disease of native coronary artery without angina pectoris: Secondary | ICD-10-CM | POA: Diagnosis not present

## 2014-07-06 DIAGNOSIS — E785 Hyperlipidemia, unspecified: Secondary | ICD-10-CM | POA: Diagnosis not present

## 2014-07-06 DIAGNOSIS — F17210 Nicotine dependence, cigarettes, uncomplicated: Secondary | ICD-10-CM | POA: Diagnosis not present

## 2014-07-06 DIAGNOSIS — E669 Obesity, unspecified: Secondary | ICD-10-CM

## 2014-07-06 DIAGNOSIS — I2119 ST elevation (STEMI) myocardial infarction involving other coronary artery of inferior wall: Secondary | ICD-10-CM

## 2014-07-06 DIAGNOSIS — I10 Essential (primary) hypertension: Secondary | ICD-10-CM | POA: Diagnosis not present

## 2014-07-06 DIAGNOSIS — I255 Ischemic cardiomyopathy: Secondary | ICD-10-CM

## 2014-07-06 LAB — COMPREHENSIVE METABOLIC PANEL
ALK PHOS: 72 U/L (ref 39–117)
ALT: 18 U/L (ref 0–53)
AST: 16 U/L (ref 0–37)
Albumin: 4.4 g/dL (ref 3.5–5.2)
BUN: 22 mg/dL (ref 6–23)
CHLORIDE: 104 meq/L (ref 96–112)
CO2: 26 mEq/L (ref 19–32)
Calcium: 9.6 mg/dL (ref 8.4–10.5)
Creat: 1 mg/dL (ref 0.50–1.35)
Glucose, Bld: 108 mg/dL — ABNORMAL HIGH (ref 70–99)
POTASSIUM: 5.1 meq/L (ref 3.5–5.3)
SODIUM: 138 meq/L (ref 135–145)
TOTAL PROTEIN: 7.2 g/dL (ref 6.0–8.3)
Total Bilirubin: 0.5 mg/dL (ref 0.2–1.2)

## 2014-07-06 LAB — LIPID PANEL
CHOL/HDL RATIO: 4.1 ratio
Cholesterol: 148 mg/dL (ref 0–200)
HDL: 36 mg/dL — ABNORMAL LOW (ref >=40)
LDL Cholesterol: 86 mg/dL (ref 0–99)
TRIGLYCERIDES: 129 mg/dL (ref <150)
VLDL: 26 mg/dL (ref 0–40)

## 2014-07-06 MED ORDER — SIMVASTATIN 40 MG PO TABS: 40.0000 mg | ORAL_TABLET | Freq: Every day | ORAL | Status: DC

## 2014-07-06 NOTE — Progress Notes (Signed)
PCP: No PCP Per Patient  Clinic Note: Chief Complaint  Patient presents with  . ROV 6 months    NO swelling, chest pain.   . Coronary Artery Disease    HPI: Benjamin Robertson is a 64 y.o. male with a PMH below who presents today for ~6 month followup for CAD with moderate ischemic cardiomyopathy. He is a former patient of Dr. Tami Ribas from the time of his initial MI in 22. who then saw Dr. Terance Ice until his retirement. I saw him for the first time in January of 2015. He had a second MI in '97, but finally was referred for CABG in 2001. His last stress test was in 2011 that did not show evidence of ischemia. There was evidence of a inferior infarct, but no ischemia.  Was supposed to have re-look Myoview after last visit --> Insurance rejected due to "added charges" (due to imaging being Hospital Based).  Past Medical History  Diagnosis Date  . History of ST elevation myocardial infarction (STEMI) of inferior wall 1994; 1997    PCI of RCA - redo PCI BMS ML Vision 3.0 mm x 25 mm to RCA in 1997  . CAD in native artery 1994, 2001    a) 2001 Exertional Angina --> Myoview: Inf infarct and Ant ischemia --> Cath: 95% pLAD, 99% D1, 80% RI, but Cx-OM1&2 OK , & patent RCA stent --> CABG: b) Cath in 2003 patent grafts, RCA & Cx; c) 08/2009 Myoview: Mod Basal-Mid inferoseptal, Basal-apical inferior Infarct. No ischemia.   . S/P CABG x 3 2001    LIMA-LAD, SVG-diagonal, SVG-Ramus Intermedius (RI) - patent by Cath in 2003  . Ischemic cardiomyopathy 1994, 2003    Echo February 2014: EF 40-45%; moderate HK of anterolateral myocardium.; Grade 1 diastolic dysfunction  . Cigarette smoker one half pack a day or less      over 40 years  . Hyperlipidemia LDL goal <70     At goal by Most recent labs October 2013: TC 123, TG 83, HDL 81, LDL 65  . Obesity (BMI 30.0-34.9)   . H/O gastroesophageal reflux (GERD)    Past Surgical History  Procedure Laterality Date  . Cardiac catheterization  01/21/1996   PTCA of RCA-95% lesion  . Cardiac catheterization  06/06/1999    Recommend CABG  . Coronary artery bypass graft  06/07/1999    x3. LIMA to distal LAD, SVG to first diag, and SVG to ramus  . Cardiac catheterization  10/04/2001    Patent grafts with a 50% lesion in LAD beyond IMA insertion  . Nm myoview ltd  08/19/2009    Moderafte perfusion seen in Basal inferoseptal, Basal inferior, Mid inferoseptal, Mid inferiorm, and Apical inferior regions. No ECG changes. EKG negative for ischemia.  . Transthoracic echocardiogram  05/06/2012    EF 40-45%, LV cavity moderately dilated, systolic function mild-moderately reduced, moderate hypokinesis of anterolatewral myocardium.  . Carotid doppler  05/06/2012    Less than 50% diameter reduction of the Lft Subclavian.    Interval History:   Doing well since last visit.  Staying active - works 12 hr / day.  Cardiovascular ROS: no chest pain or dyspnea on exertion positive for - heat intolerance dueing summer months negative for - edema, irregular heartbeat, loss of consciousness, murmur, orthopnea, palpitations, paroxysmal nocturnal dyspnea, rapid heart rate, shortness of breath or TIA/Amaurosis Fugax, syncope / near syncope.   Changed to Simvastatin without Zetia last visit.  He continues to smoke about half  pack a day and does not seem to be overly interested in stopping.  ROS: A comprehensive Review of Systems - was performed Review of Systems  Constitutional: Negative.   HENT: Negative for nosebleeds.   Eyes: Negative.   Respiratory: Negative for cough (Rarely wears his allergies.) and sputum production.   Cardiovascular: Negative for claudication.       Per history of present illness  Gastrointestinal: Negative for blood in stool and melena.  Genitourinary: Negative for hematuria.  Musculoskeletal: Positive for joint pain (Knees).       No cramping  Neurological: Negative for dizziness.  Psychiatric/Behavioral: Negative.   All other systems  reviewed and are negative.   Current Outpatient Prescriptions on File Prior to Visit  Medication Sig Dispense Refill  . aspirin 325 MG tablet Take 325 mg by mouth daily.    . metoprolol (LOPRESSOR) 50 MG tablet TAKE 1/2 TABLET BY MOUTH 2 TIMES A DAY 90 tablet 2  . naproxen sodium (ANAPROX) 220 MG tablet Take 220 mg by mouth as needed.    . nitroGLYCERIN (NITROLINGUAL) 0.4 MG/SPRAY spray Place 1 spray under the tongue every 5 (five) minutes x 3 doses as needed for chest pain.    . ramipril (ALTACE) 5 MG capsule TAKE ONE CAPSULE EVERY DAY 90 capsule 2  . tamsulosin (FLOMAX) 0.4 MG CAPS capsule Take 1 capsule (0.4 mg total) by mouth daily. 90 capsule 3   No current facility-administered medications on file prior to visit.   also taking simvastatin 40 mg daily No Known Allergies  History  Substance Use Topics  . Smoking status: Current Every Day Smoker -- 0.50 packs/day for 40 years    Types: Cigarettes  . Smokeless tobacco: Never Used  . Alcohol Use: No   History reviewed. No pertinent family history.   Wt Readings from Last 3 Encounters:  07/06/14 216 lb 9.6 oz (98.249 kg)  12/11/13 216 lb 6.4 oz (98.158 kg)  03/14/13 218 lb 3.2 oz (98.975 kg)   PHYSICAL EXAM BP 124/72 mmHg  Pulse 61  Ht 5\' 8"  (1.727 m)  Wt 216 lb 9.6 oz (98.249 kg)  BMI 32.94 kg/m2 General appearance: alert and oriented x3, cooperative, appears stated age, no distress and well-nourished and well-groomed. It is questions appropriately.  HEENT: [place]/AT, EOMI, MMM, anicteric sclera  Neck: no adenopathy, no JVD, supple, symmetrical, trachea midline and soft bilateral bruits.  Lungs: CTAB, normal percussion bilaterally and increased AP diameter. No increased worker breathing. NoW./R./R.  Heart: normal apical impulse, RRR, normal S1 and S2. Soft S4. 1/6 SEM at RUSB. No R./G.  Abdomen: soft, non-tender; bowel sounds normal; no masses, no organomegaly, no HJR Extremities: extremities normal, atraumatic, no cyanosis  or edema  Pulses: 2+ and symmetric  Neurologic: Alert and oriented X 3, normal strength and tone. Grossly normal   Adult ECG Report  Rate: 75 ;  Rhythm: normal sinus rhythm with PVCs  Narrative Interpretation: stable EKG  Recent Labs:  ? Not sure what happened to October Labs (not in computer)  Lab Results  Component Value Date   CHOL 117 03/14/2013   HDL 34* 03/14/2013   LDLCALC 60 03/14/2013   TRIG 113 03/14/2013   CHOLHDL 3.4 03/14/2013     ASSESSMENT / PLAN: Problem List Items Addressed This Visit    CAD in native artery - status post CABG x3 after PCI x2 to the RCA (LIMA-LAD, SVG-diagonal SVG-RI (Chronic)    Relatively stable with no active symptoms. Plan was for him to  have a followup Myoview Stress Test last fall, but for some reason the insurance company refused to clear the test.   This actually makes no sense to me since he is well within the time frame for the checking his stress test. I think in another year since his symptoms but we'll try again next year.  He is on aspirin, beta blocker ACE inhibitor, but and simvastatin..      Relevant Medications   simvastatin (ZOCOR) 40 MG tablet   Other Relevant Orders   EKG 12-Lead   Comprehensive metabolic panel   Lipid panel   Cigarette smoker one half pack a day or less (Chronic)    Still @ 1/2 PPD - no plans to quit. Was unsuccessful quitting before.   Smoking cessation instruction/counseling given:  counseled patient on the dangers of tobacco use, advised patient to stop smoking, and reviewed strategies to maximize success      Relevant Orders   EKG 12-Lead   Comprehensive metabolic panel   Lipid panel   Essential hypertension (Chronic)    Well-controlled on beta blocker and ACE inhibitor.      Relevant Medications   simvastatin (ZOCOR) 40 MG tablet   Other Relevant Orders   EKG 12-Lead   Comprehensive metabolic panel   Lipid panel   History of ST elevation myocardial infarction (STEMI) of inferior wall -  Primary (Chronic)    His MI was in 1997. No further heart failure symptoms since his CABG. He is a mildly reduced EF but no signs of heart failure. Clear evidence of infarct by Myoview.      Relevant Medications   simvastatin (ZOCOR) 40 MG tablet   Other Relevant Orders   EKG 12-Lead   Comprehensive metabolic panel   Lipid panel   Hyperlipidemia with target LDL less than 70 (Chronic)    Previously well controlled on Vytorin. Now back on simvastatin alone. For some reason I did not get his labs from the fall but when checked elsewhere. I have ordered for his labs to be checked today.      Relevant Medications   simvastatin (ZOCOR) 40 MG tablet   Other Relevant Orders   EKG 12-Lead   Comprehensive metabolic panel   Lipid panel   Obesity (BMI 30.0-34.9) (Chronic)    The patient understands the need to lose weight with diet and exercise. We have discussed specific strategies for this.       Relevant Orders   EKG 12-Lead   Comprehensive metabolic panel   Lipid panel       Followup: 6 months    Brytney Somes, Leonie Green, M.D., M.S. Interventional Cardiologist   Pager # [phone]

## 2014-07-06 NOTE — Assessment & Plan Note (Signed)
The patient understands the need to lose weight with diet and exercise. We have discussed specific strategies for this.  

## 2014-07-06 NOTE — Assessment & Plan Note (Addendum)
His MI was in 1997. No further heart failure symptoms since his CABG. He is a mildly reduced EF but no signs of heart failure. Clear evidence of infarct by Myoview.

## 2014-07-06 NOTE — Patient Instructions (Signed)
LABS - CMP, LIPID TODAY.  NO CHANGE WITH MEDICATIONS  Your physician wants you to follow-up in [place]. You will receive a reminder letter in the mail two months in advance. If you don't receive a letter, please call our office to schedule the follow-up appointment.

## 2014-07-06 NOTE — Assessment & Plan Note (Signed)
Relatively stable with no active symptoms. Plan was for him to have a followup Myoview Stress Test last fall, but for some reason the insurance company refused to clear the test.   This actually makes no sense to me since he is well within the time frame for the checking his stress test. I think in another year since his symptoms but we'll try again next year.  He is on aspirin, beta blocker ACE inhibitor, but and simvastatin.Marland Kitchen

## 2014-07-06 NOTE — Assessment & Plan Note (Signed)
Still @ 1/2 PPD - no plans to quit. Was unsuccessful quitting before.   Smoking cessation instruction/counseling given:  counseled patient on the dangers of tobacco use, advised patient to stop smoking, and reviewed strategies to maximize success

## 2014-07-06 NOTE — Assessment & Plan Note (Signed)
Previously well controlled on Vytorin. Now back on simvastatin alone. For some reason I did not get his labs from the fall but when checked elsewhere. I have ordered for his labs to be checked today.

## 2014-07-06 NOTE — Assessment & Plan Note (Signed)
Well-controlled on beta blocker and ACE inhibitor. 

## 2014-07-17 ENCOUNTER — Telehealth: Payer: Self-pay | Admitting: *Deleted

## 2014-07-17 DIAGNOSIS — E785 Hyperlipidemia, unspecified: Secondary | ICD-10-CM

## 2014-07-17 DIAGNOSIS — Z79899 Other long term (current) drug therapy: Secondary | ICD-10-CM

## 2014-07-17 DIAGNOSIS — E66811 Obesity, class 1: Secondary | ICD-10-CM

## 2014-07-17 DIAGNOSIS — E669 Obesity, unspecified: Secondary | ICD-10-CM

## 2014-07-17 NOTE — Telephone Encounter (Signed)
-----   Message from Leonie Man, MD sent at 07/15/2014 11:47 PM EDT ----- Chemistry panel stable. Cholesterol levels have increased a little bit since stopping Zetia portion of Vytorin It would appear the simvastatin alone is not adequate. I think we need to switch to atorvastatin 40 mg.  Leonie Man, M.D., M.S. Interventional Cardiologist   Pager # [phone]

## 2014-07-17 NOTE — Telephone Encounter (Signed)
LEFT MESSAGE TO CALL BACK ON HOME ANSWER MACHINE - CONCERNING LABS.

## 2014-07-21 NOTE — Telephone Encounter (Signed)
Attempted to reach patient - wife answered phone. Explained to her that we do not have a DPR on file for [place] to disclose personal health info to her. She seemed upset by this and states she always takes her husband's info as he works.   Will defer to [name], RN to advise

## 2014-07-21 NOTE — Telephone Encounter (Signed)
Returning your call. °

## 2014-07-21 NOTE — Telephone Encounter (Signed)
LM for patient to return call. Wife had called in but no DPR on file

## 2014-07-21 NOTE — Telephone Encounter (Signed)
Returning Sharon's call from Friday(07-17-14).

## 2014-07-23 ENCOUNTER — Encounter: Payer: Self-pay | Admitting: *Deleted

## 2014-07-23 NOTE — Telephone Encounter (Signed)
Spoke to wife. Results given. Wife states she thinks patient has tried Atorvastatin in the past. RN states she will investigate patient's old chart if possible to see any record. Will send DPI form along with a letter of result  To patient's home.

## 2014-07-23 NOTE — Telephone Encounter (Signed)
Reviewed patient's paper chart. Patient has tried pravachol, lipitor,adivcor in the past  Comments patient was intolerant with some symptoms--symptoms were not described  RN will informed Dr Lenice Llamas see if to continue with Atorvastatin

## 2014-07-24 NOTE — Telephone Encounter (Signed)
Lets try atorvastatin again -- if he can't tolerate it, then we need to add Zetia back to his Simvastatin.  [place]

## 2014-07-27 MED ORDER — ATORVASTATIN CALCIUM 40 MG PO TABS: 40.0000 mg | ORAL_TABLET | Freq: Every day | ORAL | Status: DC

## 2014-07-27 NOTE — Telephone Encounter (Signed)
Left message on wife's phone. E-sent medicaton of at[facility]atIn to [facility]  IN 3 MONTHS  WIFE CALLED BACK - INFORMATION GIVEN

## 2014-07-30 ENCOUNTER — Inpatient Hospital Stay (HOSPITAL_COMMUNITY)
Admission: AD | Admit: 2014-07-30 | Discharge: 2014-08-01 | DRG: 247 | Disposition: A | Discharge status: Home / Self Care | Payer: BLUE CROSS/BLUE SHIELD | Source: Ambulatory Visit | Attending: Internal Medicine | Admitting: Internal Medicine

## 2014-07-30 ENCOUNTER — Telehealth: Payer: Self-pay | Admitting: Cardiology

## 2014-07-30 ENCOUNTER — Encounter (HOSPITAL_COMMUNITY): Payer: Self-pay | Admitting: *Deleted

## 2014-07-30 ENCOUNTER — Encounter: Payer: Self-pay | Admitting: Internal Medicine

## 2014-07-30 ENCOUNTER — Ambulatory Visit (INDEPENDENT_AMBULATORY_CARE_PROVIDER_SITE_OTHER): Payer: BLUE CROSS/BLUE SHIELD | Admitting: Internal Medicine

## 2014-07-30 VITALS — BP 126/74 | HR 61 | Ht 68.0 in | Wt 215.2 lb

## 2014-07-30 DIAGNOSIS — I10 Essential (primary) hypertension: Secondary | ICD-10-CM | POA: Diagnosis present

## 2014-07-30 DIAGNOSIS — I255 Ischemic cardiomyopathy: Secondary | ICD-10-CM

## 2014-07-30 DIAGNOSIS — I2510 Atherosclerotic heart disease of native coronary artery without angina pectoris: Secondary | ICD-10-CM

## 2014-07-30 DIAGNOSIS — Z9861 Coronary angioplasty status: Secondary | ICD-10-CM | POA: Diagnosis not present

## 2014-07-30 DIAGNOSIS — Z888 Allergy status to other drugs, medicaments and biological substances status: Secondary | ICD-10-CM

## 2014-07-30 DIAGNOSIS — K219 Gastro-esophageal reflux disease without esophagitis: Secondary | ICD-10-CM | POA: Diagnosis present

## 2014-07-30 DIAGNOSIS — Z951 Presence of aortocoronary bypass graft: Secondary | ICD-10-CM

## 2014-07-30 DIAGNOSIS — E785 Hyperlipidemia, unspecified: Secondary | ICD-10-CM | POA: Diagnosis present

## 2014-07-30 DIAGNOSIS — I200 Unstable angina: Secondary | ICD-10-CM | POA: Diagnosis not present

## 2014-07-30 DIAGNOSIS — R079 Chest pain, unspecified: Secondary | ICD-10-CM

## 2014-07-30 DIAGNOSIS — E669 Obesity, unspecified: Secondary | ICD-10-CM | POA: Diagnosis present

## 2014-07-30 DIAGNOSIS — R110 Nausea: Secondary | ICD-10-CM | POA: Diagnosis present

## 2014-07-30 DIAGNOSIS — I2119 ST elevation (STEMI) myocardial infarction involving other coronary artery of inferior wall: Secondary | ICD-10-CM

## 2014-07-30 DIAGNOSIS — I214 Non-ST elevation (NSTEMI) myocardial infarction: Principal | ICD-10-CM | POA: Diagnosis present

## 2014-07-30 DIAGNOSIS — Z8249 Family history of ischemic heart disease and other diseases of the circulatory system: Secondary | ICD-10-CM | POA: Diagnosis not present

## 2014-07-30 DIAGNOSIS — Z6831 Body mass index (BMI) 31.0-31.9, adult: Secondary | ICD-10-CM | POA: Diagnosis not present

## 2014-07-30 DIAGNOSIS — F17210 Nicotine dependence, cigarettes, uncomplicated: Secondary | ICD-10-CM

## 2014-07-30 DIAGNOSIS — I25110 Atherosclerotic heart disease of native coronary artery with unstable angina pectoris: Secondary | ICD-10-CM | POA: Diagnosis present

## 2014-07-30 DIAGNOSIS — E66811 Obesity, class 1: Secondary | ICD-10-CM | POA: Diagnosis present

## 2014-07-30 HISTORY — DX: Pneumonia, unspecified organism: J18.9

## 2014-07-30 HISTORY — DX: Unspecified asthma, uncomplicated: J45.909

## 2014-07-30 HISTORY — DX: Essential (primary) hypertension: I10

## 2014-07-30 HISTORY — DX: Unspecified osteoarthritis, unspecified site: M19.90

## 2014-07-30 HISTORY — DX: Calculus of kidney: N20.0

## 2014-07-30 LAB — CBC
HEMATOCRIT: 48.1 % (ref 39.0–52.0)
Hemoglobin: 17 g/dL (ref 13.0–17.0)
MCH: 32.5 pg (ref 26.0–34.0)
MCHC: 35.3 g/dL (ref 30.0–36.0)
MCV: 92 fL (ref 78.0–100.0)
PLATELETS: 209 10*3/uL (ref 150–400)
RBC: 5.23 MIL/uL (ref 4.22–5.81)
RDW: 13 % (ref 11.5–15.5)
WBC: 9.3 10*3/uL (ref 4.0–10.5)

## 2014-07-30 LAB — COMPREHENSIVE METABOLIC PANEL
ALK PHOS: 69 U/L (ref 38–126)
ALT: 19 U/L (ref 17–63)
AST: 17 U/L (ref 15–41)
Albumin: 3.6 g/dL (ref 3.5–5.0)
Anion gap: 8 (ref 5–15)
BUN: 15 mg/dL (ref 6–20)
CO2: 25 mmol/L (ref 22–32)
CREATININE: 0.91 mg/dL (ref 0.61–1.24)
Calcium: 9.4 mg/dL (ref 8.9–10.3)
Chloride: 106 mmol/L (ref 101–111)
GFR calc Af Amer: >60 mL/min (ref >60)
GLUCOSE: 87 mg/dL (ref 65–99)
Potassium: 4.4 mmol/L (ref 3.5–5.1)
Sodium: 139 mmol/L (ref 135–145)
TOTAL PROTEIN: 6.5 g/dL (ref 6.5–8.1)
Total Bilirubin: 0.8 mg/dL (ref 0.3–1.2)

## 2014-07-30 LAB — HEPARIN LEVEL (UNFRACTIONATED): HEPARIN UNFRACTIONATED: 0.1 [IU]/mL — AB (ref 0.30–0.70)

## 2014-07-30 LAB — PROTIME-INR
INR: 1.05 (ref 0.00–1.49)
Prothrombin Time: 13.9 seconds (ref 11.6–15.2)

## 2014-07-30 LAB — TROPONIN I
TROPONIN I: 0.07 ng/mL — AB (ref <0.031)
TROPONIN I: 0.07 ng/mL — AB (ref <0.031)

## 2014-07-30 MED ORDER — SODIUM CHLORIDE 0.9 % IJ SOLN: 3.0000 mL | INTRAMUSCULAR | Status: DC | PRN

## 2014-07-30 MED ORDER — NITROGLYCERIN 0.4 MG SL SUBL: 0.4000 mg | SUBLINGUAL_TABLET | SUBLINGUAL | Status: DC | PRN

## 2014-07-30 MED ORDER — METOPROLOL TARTRATE 25 MG PO TABS
25.0000 mg | ORAL_TABLET | Freq: Two times a day (BID) | ORAL | Status: DC
  Administered 2014-07-30 – 2014-08-01 (×4): 25 mg via ORAL
  Filled 2014-07-30 (×4): qty 1

## 2014-07-30 MED ORDER — HEPARIN BOLUS VIA INFUSION
2500.0000 [IU] | Freq: Once | INTRAVENOUS | Status: AC
  Administered 2014-07-31: 2500 [IU] via INTRAVENOUS
  Filled 2014-07-30: qty 2500

## 2014-07-30 MED ORDER — HEPARIN (PORCINE) IN NACL 100-0.45 UNIT/ML-% IJ SOLN
1200.0000 [IU]/h | INTRAMUSCULAR | Status: DC
  Administered 2014-07-30: 1200 [IU]/h via INTRAVENOUS
  Filled 2014-07-30: qty 250

## 2014-07-30 MED ORDER — SODIUM CHLORIDE 0.9 % IJ SOLN
3.0000 mL | Freq: Two times a day (BID) | INTRAMUSCULAR | Status: DC
Start: 2014-07-30 — End: 2014-08-01
  Administered 2014-07-30 – 2014-07-31 (×2): 3 mL via INTRAVENOUS

## 2014-07-30 MED ORDER — ASPIRIN 81 MG PO CHEW: 81.0000 mg | CHEWABLE_TABLET | ORAL | Status: DC

## 2014-07-30 MED ORDER — ZOLPIDEM TARTRATE 5 MG PO TABS: 5.0000 mg | ORAL_TABLET | Freq: Every evening | ORAL | Status: DC | PRN

## 2014-07-30 MED ORDER — HEPARIN BOLUS VIA INFUSION
4000.0000 [IU] | Freq: Once | INTRAVENOUS | Status: AC
  Administered 2014-07-30: 4000 [IU] via INTRAVENOUS
  Filled 2014-07-30: qty 4000

## 2014-07-30 MED ORDER — SODIUM CHLORIDE 0.9 % IJ SOLN
3.0000 mL | Freq: Two times a day (BID) | INTRAMUSCULAR | Status: DC
  Administered 2014-07-31: 3 mL via INTRAVENOUS

## 2014-07-30 MED ORDER — RAMIPRIL 5 MG PO CAPS
5.0000 mg | ORAL_CAPSULE | Freq: Every day | ORAL | Status: DC
  Administered 2014-07-31 – 2014-08-01 (×2): 5 mg via ORAL
  Filled 2014-07-30 (×2): qty 1

## 2014-07-30 MED ORDER — ALPRAZOLAM 0.25 MG PO TABS: 0.2500 mg | ORAL_TABLET | Freq: Two times a day (BID) | ORAL | Status: DC | PRN

## 2014-07-30 MED ORDER — TAMSULOSIN HCL 0.4 MG PO CAPS
0.4000 mg | ORAL_CAPSULE | Freq: Every day | ORAL | Status: DC
  Administered 2014-08-01: 09:00:00 0.4 mg via ORAL
  Filled 2014-07-30 (×2): qty 1

## 2014-07-30 MED ORDER — HEPARIN (PORCINE) IN NACL 100-0.45 UNIT/ML-% IJ SOLN
1500.0000 [IU]/h | INTRAMUSCULAR | Status: DC
  Administered 2014-07-31: 1500 [IU]/h via INTRAVENOUS
  Filled 2014-07-30: qty 250

## 2014-07-30 MED ORDER — ONDANSETRON HCL 4 MG/2ML IJ SOLN
4.0000 mg | Freq: Four times a day (QID) | INTRAMUSCULAR | Status: DC | PRN
  Filled 2014-07-30: qty 2

## 2014-07-30 MED ORDER — ATORVASTATIN CALCIUM 40 MG PO TABS: 40.0000 mg | ORAL_TABLET | Freq: Every day | ORAL | Status: DC

## 2014-07-30 MED ORDER — SODIUM CHLORIDE 0.9 % IV SOLN
INTRAVENOUS | Status: DC
  Administered 2014-07-30 (×2): via INTRAVENOUS

## 2014-07-30 MED ORDER — ATORVASTATIN CALCIUM 40 MG PO TABS
40.0000 mg | ORAL_TABLET | Freq: Every day | ORAL | Status: DC
  Administered 2014-07-30 – 2014-07-31 (×2): 40 mg via ORAL
  Filled 2014-07-30 (×2): qty 1

## 2014-07-30 MED ORDER — ASPIRIN 325 MG PO TABS
325.0000 mg | ORAL_TABLET | Freq: Every day | ORAL | Status: DC
  Administered 2014-07-31: 325 mg via ORAL
  Filled 2014-07-30: qty 1

## 2014-07-30 MED ORDER — SODIUM CHLORIDE 0.9 % IV SOLN: 250.0000 mL | INTRAVENOUS | Status: DC | PRN

## 2014-07-30 MED ORDER — ACETAMINOPHEN 325 MG PO TABS: 650.0000 mg | ORAL_TABLET | ORAL | Status: DC | PRN

## 2014-07-30 MED ORDER — POTASSIUM CITRATE ER 10 MEQ (1080 MG) PO TBCR
10.0000 meq | EXTENDED_RELEASE_TABLET | Freq: Two times a day (BID) | ORAL | Status: DC
  Administered 2014-07-31 – 2014-08-01 (×4): 10 meq via ORAL
  Filled 2014-07-30 (×10): qty 1

## 2014-07-30 NOTE — Progress Notes (Signed)
Pt was unable to be done today in cath lab, is scheduled for 1030 in AM.  Troponin is mildly + will continue to monitor.  On IV heparin and no current pain.

## 2014-07-30 NOTE — Progress Notes (Signed)
OFFICE NOTE  Chief Complaint:  Progressive chest pain  Primary Care Physician: No PCP Per Patient  HPI:  Benjamin Robertson is a 64 y.o. male with a PMH below who was recently seen by Dr. Ellyn Hack for a 6 month followup for CAD with moderate ischemic cardiomyopathy. He is a former patient of Dr. Tami Ribas from the time of his initial MI in 41. who then saw Dr. Terance Ice until his retirement. I saw him for the first time in January of 2015. He had a second MI in '97, but finally was referred for CABG in 2001. His last stress test was in 2011 that did not show evidence of ischemia. There was evidence of a inferior infarct, but no ischemia. He underwent three-vessel bypass in 2001 with LIMA to LAD, SVG to diagonal and SVG to ramus intermedius. He was recently referred to a stress test, but as he was asymptomatic this was declined by his insurance company. Some changes were made in his cholesterol medicine but he has not yet started a atorvastatin. He was previously on Vytorin.  [name] is seen in the office today as an acute visit on my schedule as Dr. Ellyn Hack is not in the office. He describes that he's been having some chest pain over the past several days. 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 He was somewhat nauseated and describes some heaviness in the central chest that radiated to his left arm and down the left arm including some tingling in his arm. He took nitroglycerin and had relief of his symptoms after about 45 minutes. He had another episode recently with associated marked diaphoresis which was abnormal for him. This morning he went to work early in the morning and was going to make breakfast but felt nauseated and had no appetite. Then he developed more chest pain, spoke with his wife and was then instructed to contact the office.  EKG in the office today shows normal sinus rhythm with poor R-wave progression anteriorly concerning for possible  anterior MI. There is no ST elevation or acute ischemic change.  PMHx:  Past Medical History  Diagnosis Date  . History of ST elevation myocardial infarction (STEMI) of inferior wall 1994; 1997    PCI of RCA - redo PCI BMS ML Vision 3.0 mm x 25 mm to RCA in 1997  . CAD in native artery 1994, 2001    a) 2001 Exertional Angina --> Myoview: Inf infarct and Ant ischemia --> Cath: 95% pLAD, 99% D1, 80% RI, but Cx-OM1&2 OK , & patent RCA stent --> CABG: b) Cath in 2003 patent grafts, RCA & Cx; c) 08/2009 Myoview: Mod Basal-Mid inferoseptal, Basal-apical inferior Infarct. No ischemia.   . S/P CABG x 3 2001    LIMA-LAD, SVG-diagonal, SVG-Ramus Intermedius (RI) - patent by Cath in 2003  . Ischemic cardiomyopathy 1994, 2003    Echo February 2014: EF 40-45%; moderate HK of anterolateral myocardium.; Grade 1 diastolic dysfunction  . Cigarette smoker one half pack a day or less      over 40 years  . Hyperlipidemia LDL goal <70     At goal by Most recent labs October 2013: TC 123, TG 83, HDL 81, LDL 65  . Obesity (BMI 30.0-34.9)   . H/O gastroesophageal reflux (GERD)     Past Surgical History  Procedure Laterality Date  . Cardiac catheterization  01/21/1996    PTCA of RCA-95% lesion  . Cardiac  catheterization  06/06/1999    Recommend CABG  . Coronary artery bypass graft  06/07/1999    x3. LIMA to distal LAD, SVG to first diag, and SVG to ramus  . Cardiac catheterization  10/04/2001    Patent grafts with a 50% lesion in LAD beyond IMA insertion  . Nm myoview ltd  08/19/2009    Moderafte perfusion seen in Basal inferoseptal, Basal inferior, Mid inferoseptal, Mid inferiorm, and Apical inferior regions. No ECG changes. EKG negative for ischemia.  . Transthoracic echocardiogram  05/06/2012    EF 40-45%, LV cavity moderately dilated, systolic function mild-moderately reduced, moderate hypokinesis of anterolatewral myocardium.  . Carotid doppler  05/06/2012    Less than 50% diameter reduction of the Lft  Subclavian.    FAMHx:  Family History  Problem Relation Age of Onset  . Heart attack Mother     SOCHx:   reports that he has been smoking Cigarettes.  He has a 20 pack-year smoking history. He has never used smokeless tobacco. He reports that he does not drink alcohol. His drug history is not on file.  ALLERGIES:  Allergies  Allergen Reactions  . Advicor [Niacin-Lovastatin Er]   . Lipitor [Atorvastatin]   . Pravachol [Pravastatin Sodium]     ROS: A comprehensive review of systems was negative except for: Cardiovascular: positive for exertional chest pressure/discomfort  HOME MEDS: No current outpatient prescriptions on file.   No current facility-administered medications for this visit.    LABS/IMAGING: No results found for this or any previous visit (from the past 48 hour(s)). No results found.  WEIGHTS: Wt Readings from Last 3 Encounters:  07/30/14 215 lb 3.2 oz (97.614 kg)  07/06/14 216 lb 9.6 oz (98.249 kg)  12/11/13 216 lb 6.4 oz (98.158 kg)    VITALS: BP 126/74 mmHg  Pulse 61  Ht 5\' 8"  (1.727 m)  Wt 215 lb 3.2 oz (97.614 kg)  BMI 32.73 kg/m2  EXAM: General appearance: alert and mild distress Neck: no carotid bruit and no JVD Lungs: clear to auscultation bilaterally Heart: regular rate and rhythm, S1, S2 normal, no murmur, click, rub or gallop Abdomen: soft, non-tender; bowel sounds normal; no masses,  no organomegaly Extremities: extremities normal, atraumatic, no cyanosis or edema Pulses: 2+ and symmetric Skin: Skin is flushed and red Neurologic: Grossly normal Psych: Pleasant  EKG: Normal sinus rhythm at 61 with poor R-wave progression anteriorly  ASSESSMENT: 1. Unstable angina 2. History of coronary bypass 3 2001 3. Hypertension 4. Ongoing tobacco abuse 5. Dyslipidemia  PLAN: 1.   Mr. Sulkowski is describing unstable angina with symptoms at a been occurring and increased frequency over the past several days. He has had some relief with  nitroglycerin sublingual spray. He had an episode of diaphoresis which is very unusual for him and was nauseated this morning was not able to eat. His EKG does not show acute ischemia, but is certainly abnormal. Given his history and the age of his grafts, I'm recommending an admission for heart catheterization. Should be placed on heparin and will treat his angina with nitroglycerin. I spoke with the cardiac Cath Lab and hopefully we can arrange this later today. He is aware the risks and benefits of car catheterization and agrees to proceed.  Pixie Casino, MD, [facility] Attending Cardiologist [place] 07/30/2014, 12:48 PM

## 2014-07-30 NOTE — Progress Notes (Addendum)
ANTICOAGULATION CONSULT NOTE - Initial Consult  Pharmacy Consult for Heparin Indication: chest pain/ACS  Allergies  Allergen Reactions  . Advicor [Niacin-Lovastatin Er]   . Lipitor [Atorvastatin]   . Pravachol [Pravastatin Sodium]     Patient Measurements: Weight: 211 lb 3.2 oz (95.8 kg)  IBW 68.4 Heparin Dosing Weight: 88.6 kg  Vital Signs: Temp: 98.4 F (36.9 C) (05/19 1251) Temp Source: Oral (05/19 1251) BP: 117/65 mmHg (05/19 1251) Pulse Rate: 64 (05/19 1251)  Labs: No results for input(s): HGB, HCT, PLT, APTT, LABPROT, INR, HEPARINUNFRC, CREATININE, CKTOTAL, CKMB, TROPONINI in the last 72 hours.  CrCl cannot be calculated (Patient has no serum creatinine result on file.).   Medical History: Past Medical History  Diagnosis Date  . History of ST elevation myocardial infarction (STEMI) of inferior wall 1994; 1997    PCI of RCA - redo PCI BMS ML Vision 3.0 mm x 25 mm to RCA in 1997  . CAD in native artery 1994, 2001    a) 2001 Exertional Angina --> Myoview: Inf infarct and Ant ischemia --> Cath: 95% pLAD, 99% D1, 80% RI, but Cx-OM1&2 OK , & patent RCA stent --> CABG: b) Cath in 2003 patent grafts, RCA & Cx; c) 08/2009 Myoview: Mod Basal-Mid inferoseptal, Basal-apical inferior Infarct. No ischemia.   . S/P CABG x 3 2001    LIMA-LAD, SVG-diagonal, SVG-Ramus Intermedius (RI) - patent by Cath in 2003  . Ischemic cardiomyopathy 1994, 2003    Echo February 2014: EF 40-45%; moderate HK of anterolateral myocardium.; Grade 1 diastolic dysfunction  . Cigarette smoker one half pack a day or less      over 40 years  . Hyperlipidemia LDL goal <70     At goal by Most recent labs October 2013: TC 123, TG 83, HDL 81, LDL 65  . Obesity (BMI 30.0-34.9)   . H/O gastroesophageal reflux (GERD)     Medications:  Prescriptions prior to admission  Medication Sig Dispense Refill Last Dose  . aspirin 325 MG tablet Take 325 mg by mouth daily.   Taking  . atorvastatin (LIPITOR) 40 MG tablet  Take 1 tablet (40 mg total) by mouth daily. 30 tablet 5 Taking  . metoprolol (LOPRESSOR) 50 MG tablet TAKE 1/2 TABLET BY MOUTH 2 TIMES A DAY 90 tablet 2 Taking  . naproxen sodium (ANAPROX) 220 MG tablet Take 220 mg by mouth as needed.   Taking  . nitroGLYCERIN (NITROLINGUAL) 0.4 MG/SPRAY spray Place 1 spray under the tongue every 5 (five) minutes x 3 doses as needed for chest pain.   Taking  . potassium citrate (UROCIT-K) 10 MEQ (1080 MG) SR tablet Take 10 mEq by mouth 2 (two) times daily.  11 Taking  . ramipril (ALTACE) 5 MG capsule TAKE ONE CAPSULE EVERY DAY 90 capsule 2 Taking  . tamsulosin (FLOMAX) 0.4 MG CAPS capsule Take 1 capsule (0.4 mg total) by mouth daily. 90 capsule 3 Taking    Assessment: 64 yo male with presenting to cardiology office with CP x several days. Nausea with heaviness in central chest that radiated. SL NTG provided relief. No ST elevation  PMH: CAD s/p CABG, ischemic CM, HLD, GERD  Initiating heparin for unstable angina. no PTA AC. Patient to potentially have cath this afternoon.  Goal of Therapy:  Heparin level 0.3-0.7 units/ml Monitor platelets by anticoagulation protocol: Yes   Plan:  Initiate heparin 4000 bolus followed by 1200 units/hr HL in 6 hours or f/u after cath Daily CBC, HL Monitor for bleeding  Levester Fresh, PharmD, BCPS Clinical Pharmacist [phone] 5/19/20162:01 PM    Addendum: Cath postponed until tomorrow at 1030. Initial heparin level is low at 0.10. No issues with infusion.  Plan: Re-bolus heparin 2500 units x 1 Increase heparin to 1500 units/hr Follow up heparin level in AM  Nena Jordan, PharmD, BCPS 07/30/2014, 9:57 PM

## 2014-07-30 NOTE — H&P (Signed)
HISTORY AND PHYSICAL NORE  Chief Complaint:  Progressive chest pain  Primary Care Physician: No PCP Per Patient  HPI:  Benjamin Robertson is a 64 y.o. male with a PMH below who was recently seen by Dr. Ellyn Hack for a 6 month followup for CAD with moderate ischemic cardiomyopathy. He is a former patient of Dr. Tami Ribas from the time of his initial MI in 30. who then saw Dr. Terance Ice until his retirement. I saw him for the first time in January of 2015. He had a second MI in '97, but finally was referred for CABG in 2001. His last stress test was in 2011 that did not show evidence of ischemia. There was evidence of a inferior infarct, but no ischemia. He underwent three-vessel bypass in 2001 with LIMA to LAD, SVG to diagonal and SVG to ramus intermedius. He was recently referred to a stress test, but as he was asymptomatic this was declined by his insurance company. Some changes were made in his cholesterol medicine but he has not yet started a atorvastatin. He was previously on Vytorin.  [name] is seen in the office today as an acute visit on my schedule as Dr. Ellyn Hack is not in the office. He describes that he's been having some chest pain over the past several days. 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 He was somewhat nauseated and describes some heaviness in the central chest that radiated to his left arm and down the left arm including some tingling in his arm. He took nitroglycerin and had relief of his symptoms after about 45 minutes. He had another episode recently with associated marked diaphoresis which was abnormal for him. This morning he went to work early in the morning and was going to make breakfast but felt nauseated and had no appetite. Then he developed more chest pain, spoke with his wife and was then instructed to contact the office.  EKG in the office today shows normal sinus rhythm with poor R-wave progression anteriorly concerning  for possible anterior MI. There is no ST elevation or acute ischemic change.  PMHx:  Past Medical History  Diagnosis Date  . History of ST elevation myocardial infarction (STEMI) of inferior wall 1994; 1997    PCI of RCA - redo PCI BMS ML Vision 3.0 mm x 25 mm to RCA in 1997  . CAD in native artery 1994, 2001    a) 2001 Exertional Angina --> Myoview: Inf infarct and Ant ischemia --> Cath: 95% pLAD, 99% D1, 80% RI, but Cx-OM1&2 OK , & patent RCA stent --> CABG: b) Cath in 2003 patent grafts, RCA & Cx; c) 08/2009 Myoview: Mod Basal-Mid inferoseptal, Basal-apical inferior Infarct. No ischemia.   . S/P CABG x 3 2001    LIMA-LAD, SVG-diagonal, SVG-Ramus Intermedius (RI) - patent by Cath in 2003  . Ischemic cardiomyopathy 1994, 2003    Echo February 2014: EF 40-45%; moderate HK of anterolateral myocardium.; Grade 1 diastolic dysfunction  . Cigarette smoker one half pack a day or less      over 40 years  . Hyperlipidemia LDL goal <70     At goal by Most recent labs October 2013: TC 123, TG 83, HDL 81, LDL 65  . Obesity (BMI 30.0-34.9)   . H/O gastroesophageal reflux (GERD)     Past Surgical History  Procedure Laterality Date  . Cardiac catheterization  01/21/1996    PTCA of RCA-95% lesion  .  Cardiac catheterization  06/06/1999    Recommend CABG  . Coronary artery bypass graft  06/07/1999    x3. LIMA to distal LAD, SVG to first diag, and SVG to ramus  . Cardiac catheterization  10/04/2001    Patent grafts with a 50% lesion in LAD beyond IMA insertion  . Nm myoview ltd  08/19/2009    Moderafte perfusion seen in Basal inferoseptal, Basal inferior, Mid inferoseptal, Mid inferiorm, and Apical inferior regions. No ECG changes. EKG negative for ischemia.  . Transthoracic echocardiogram  05/06/2012    EF 40-45%, LV cavity moderately dilated, systolic function mild-moderately reduced, moderate hypokinesis of anterolatewral myocardium.  . Carotid doppler  05/06/2012    Less than 50% diameter reduction of  the Lft Subclavian.    FAMHx:  Family History  Problem Relation Age of Onset  . Heart attack Mother     SOCHx:   reports that he has been smoking Cigarettes.  He has a 20 pack-year smoking history. He has never used smokeless tobacco. He reports that he does not drink alcohol. His drug history is not on file.  ALLERGIES:  Allergies  Allergen Reactions  . Advicor [Niacin-Lovastatin Er]   . Lipitor [Atorvastatin]   . Pravachol [Pravastatin Sodium]     ROS: A comprehensive review of systems was negative except for: Cardiovascular: positive for exertional chest pressure/discomfort  HOME MEDS: No current facility-administered medications for this encounter.    LABS/IMAGING: No results found for this or any previous visit (from the past 48 hour(s)). No results found.  WEIGHTS: Wt Readings from Last 3 Encounters:  07/30/14 211 lb 3.2 oz (95.8 kg)  07/30/14 215 lb 3.2 oz (97.614 kg)  07/06/14 216 lb 9.6 oz (98.249 kg)    VITALS: BP 117/65 mmHg  Pulse 64  Temp(Src) 98.4 F (36.9 C) (Oral)  Wt 211 lb 3.2 oz (95.8 kg)  SpO2 96%  EXAM: General appearance: alert and mild distress Neck: no carotid bruit and no JVD Lungs: clear to auscultation bilaterally Heart: regular rate and rhythm, S1, S2 normal, no murmur, click, rub or gallop Abdomen: soft, non-tender; bowel sounds normal; no masses,  no organomegaly Extremities: extremities normal, atraumatic, no cyanosis or edema Pulses: 2+ and symmetric Skin: Skin is flushed and red Neurologic: Grossly normal Psych: Pleasant  EKG: Normal sinus rhythm at 61 with poor R-wave progression anteriorly  ASSESSMENT: 1. Unstable angina 2. History of coronary bypass 3 2001 3. Hypertension 4. Ongoing tobacco abuse 5. Dyslipidemia  PLAN: 1.   [name] is describing unstable angina with symptoms at a been occurring and increased frequency over the past several days. He has had some relief with nitroglycerin sublingual spray. He  had an episode of diaphoresis which is very unusual for him and was nauseated this morning was not able to eat. His EKG does not show acute ischemia, but is certainly abnormal. Given his history and the age of his grafts, I'm recommending an admission for heart catheterization. Should be placed on heparin and will treat his angina with nitroglycerin. I spoke with the cardiac Cath Lab and hopefully we can arrange this later today. He is aware the risks and benefits of car catheterization and agrees to proceed.  Pixie Casino, MD, [facility] Attending Cardiologist [place] 07/30/2014, 12:53 PM

## 2014-07-30 NOTE — Progress Notes (Signed)
Cardiac catheterization scheduled for today was canceled due to urgent patients.

## 2014-07-30 NOTE — Telephone Encounter (Signed)
Spoke to wife. She states she is calling ,the patient at work- he has called chest discomfort today. It has been occurring for the last week.she states she does not kow if he has NTG tabs WITH HIM. Request an appointment for today or tomorrow. RN recommend going to ER IF SYMTPOMS become worse,but appointment today at 11:30 am with Dr Debara Pickett. Wife states she will inform patient.

## 2014-07-30 NOTE — Patient Instructions (Signed)
Please go to [place] [facility] to admitting department - there is valet parking You will have a bed on 3W - stepdown unit

## 2014-07-31 ENCOUNTER — Encounter (HOSPITAL_COMMUNITY)
Admission: AD | Disposition: A | Discharge status: Home / Self Care | Payer: BLUE CROSS/BLUE SHIELD | Source: Ambulatory Visit | Attending: Internal Medicine

## 2014-07-31 ENCOUNTER — Encounter (HOSPITAL_COMMUNITY): Payer: Self-pay | Admitting: Interventional Cardiology

## 2014-07-31 DIAGNOSIS — I2510 Atherosclerotic heart disease of native coronary artery without angina pectoris: Secondary | ICD-10-CM

## 2014-07-31 DIAGNOSIS — Z955 Presence of coronary angioplasty implant and graft: Secondary | ICD-10-CM | POA: Insufficient documentation

## 2014-07-31 DIAGNOSIS — I214 Non-ST elevation (NSTEMI) myocardial infarction: Principal | ICD-10-CM | POA: Diagnosis present

## 2014-07-31 DIAGNOSIS — Z9861 Coronary angioplasty status: Secondary | ICD-10-CM

## 2014-07-31 HISTORY — PX: CARDIAC CATHETERIZATION: SHX172

## 2014-07-31 LAB — CBC
HEMATOCRIT: 49.3 % (ref 39.0–52.0)
HEMOGLOBIN: 17.1 g/dL — AB (ref 13.0–17.0)
MCH: 32.3 pg (ref 26.0–34.0)
MCHC: 34.7 g/dL (ref 30.0–36.0)
MCV: 93.2 fL (ref 78.0–100.0)
Platelets: 193 10*3/uL (ref 150–400)
RBC: 5.29 MIL/uL (ref 4.22–5.81)
RDW: 13.2 % (ref 11.5–15.5)
WBC: 7.9 10*3/uL (ref 4.0–10.5)

## 2014-07-31 LAB — LIPID PANEL
Cholesterol: 131 mg/dL (ref 0–200)
HDL: 32 mg/dL — ABNORMAL LOW (ref >40)
LDL CALC: 79 mg/dL (ref 0–99)
Total CHOL/HDL Ratio: 4.1 RATIO
Triglycerides: 100 mg/dL (ref <150)
VLDL: 20 mg/dL (ref 0–40)

## 2014-07-31 LAB — HEMOGLOBIN A1C
HEMOGLOBIN A1C: 5.9 % — AB (ref 4.8–5.6)
Mean Plasma Glucose: 123 mg/dL

## 2014-07-31 LAB — HEPARIN LEVEL (UNFRACTIONATED)
HEPARIN UNFRACTIONATED: 0.44 [IU]/mL (ref 0.30–0.70)
Heparin Unfractionated: 0.38 IU/mL (ref 0.30–0.70)

## 2014-07-31 LAB — POCT ACTIVATED CLOTTING TIME
Activated Clotting Time: 288 seconds
Activated Clotting Time: 306 seconds

## 2014-07-31 LAB — TROPONIN I: Troponin I: 0.06 ng/mL — ABNORMAL HIGH (ref <0.031)

## 2014-07-31 SURGERY — LEFT HEART CATH AND CORS/GRAFTS ANGIOGRAPHY
Anesthesia: LOCAL

## 2014-07-31 MED ORDER — SODIUM CHLORIDE 0.9 % IJ SOLN: 3.0000 mL | INTRAMUSCULAR | Status: DC | PRN

## 2014-07-31 MED ORDER — CANGRELOR BOLUS VIA INFUSION
INTRAVENOUS | Status: DC | PRN
  Administered 2014-07-31: 2880 ug via INTRAVENOUS

## 2014-07-31 MED ORDER — SODIUM CHLORIDE 0.9 % IJ SOLN
3.0000 mL | Freq: Two times a day (BID) | INTRAMUSCULAR | Status: DC
  Administered 2014-07-31: 22:00:00 3 mL via INTRAVENOUS

## 2014-07-31 MED ORDER — ACETAMINOPHEN 325 MG PO TABS: 650.0000 mg | ORAL_TABLET | ORAL | Status: DC | PRN

## 2014-07-31 MED ORDER — MORPHINE SULFATE 2 MG/ML IJ SOLN: 1.0000 mg | INTRAMUSCULAR | Status: DC | PRN

## 2014-07-31 MED ORDER — IOHEXOL 350 MG/ML SOLN
INTRAVENOUS | Status: DC | PRN
  Administered 2014-07-31: 175 mL via INTRA_ARTERIAL

## 2014-07-31 MED ORDER — MIDAZOLAM HCL 2 MG/2ML IJ SOLN
INTRAMUSCULAR | Status: DC | PRN
  Administered 2014-07-31 (×2): 1 mg via INTRAVENOUS
  Administered 2014-07-31: 2 mg via INTRAVENOUS

## 2014-07-31 MED ORDER — ANGIOPLASTY BOOK
Freq: Once | Status: DC
  Filled 2014-07-31: qty 1

## 2014-07-31 MED ORDER — FENTANYL CITRATE (PF) 100 MCG/2ML IJ SOLN
INTRAMUSCULAR | Status: DC | PRN
  Administered 2014-07-31 (×3): 25 ug via INTRAVENOUS

## 2014-07-31 MED ORDER — LIDOCAINE HCL (PF) 1 % IJ SOLN
INTRAMUSCULAR | Status: AC
  Filled 2014-07-31: qty 30

## 2014-07-31 MED ORDER — VERAPAMIL HCL 2.5 MG/ML IV SOLN
INTRAVENOUS | Status: DC | PRN
  Administered 2014-07-31: 11:00:00 via INTRA_ARTERIAL

## 2014-07-31 MED ORDER — MIDAZOLAM HCL 2 MG/2ML IJ SOLN
INTRAMUSCULAR | Status: AC
  Filled 2014-07-31: qty 2

## 2014-07-31 MED ORDER — SODIUM CHLORIDE 0.9 % IV SOLN: 250.0000 mL | INTRAVENOUS | Status: DC | PRN

## 2014-07-31 MED ORDER — HEPARIN (PORCINE) IN NACL 2-0.9 UNIT/ML-% IJ SOLN
INTRAMUSCULAR | Status: AC
  Filled 2014-07-31: qty 1000

## 2014-07-31 MED ORDER — CANGRELOR TETRASODIUM 50 MG IV SOLR
INTRAVENOUS | Status: AC
  Filled 2014-07-31: qty 50

## 2014-07-31 MED ORDER — HEPARIN SODIUM (PORCINE) 1000 UNIT/ML IJ SOLN
INTRAMUSCULAR | Status: AC
  Filled 2014-07-31: qty 1

## 2014-07-31 MED ORDER — LIDOCAINE HCL (PF) 1 % IJ SOLN
INTRAMUSCULAR | Status: DC | PRN
  Administered 2014-07-31: 5 mL via SUBCUTANEOUS

## 2014-07-31 MED ORDER — SODIUM CHLORIDE 0.9 % WEIGHT BASED INFUSION
3.0000 mL/kg/h | INTRAVENOUS | Status: AC
  Administered 2014-07-31: 3 mL/kg/h via INTRAVENOUS

## 2014-07-31 MED ORDER — ONDANSETRON HCL 4 MG/2ML IJ SOLN: 4.0000 mg | Freq: Four times a day (QID) | INTRAMUSCULAR | Status: DC | PRN

## 2014-07-31 MED ORDER — TICAGRELOR 90 MG PO TABS
ORAL_TABLET | ORAL | Status: AC
  Filled 2014-07-31: qty 2

## 2014-07-31 MED ORDER — TICAGRELOR 90 MG PO TABS
ORAL_TABLET | ORAL | Status: DC | PRN
  Administered 2014-07-31: 180 mg via ORAL

## 2014-07-31 MED ORDER — HEPARIN SODIUM (PORCINE) 1000 UNIT/ML IJ SOLN
INTRAMUSCULAR | Status: DC | PRN
  Administered 2014-07-31: 2000 [IU] via INTRAVENOUS
  Administered 2014-07-31 (×2): 5000 [IU] via INTRAVENOUS

## 2014-07-31 MED ORDER — NITROGLYCERIN 1 MG/10 ML FOR IR/CATH LAB
INTRA_ARTERIAL | Status: AC
  Filled 2014-07-31: qty 10

## 2014-07-31 MED ORDER — ASPIRIN 81 MG PO CHEW
81.0000 mg | CHEWABLE_TABLET | Freq: Every day | ORAL | Status: DC
  Administered 2014-08-01: 09:00:00 81 mg via ORAL
  Filled 2014-07-31: qty 1

## 2014-07-31 MED ORDER — FENTANYL CITRATE (PF) 100 MCG/2ML IJ SOLN
INTRAMUSCULAR | Status: AC
  Filled 2014-07-31: qty 2

## 2014-07-31 MED ORDER — TICAGRELOR 90 MG PO TABS
90.0000 mg | ORAL_TABLET | Freq: Two times a day (BID) | ORAL | Status: DC
  Administered 2014-07-31 – 2014-08-01 (×2): 90 mg via ORAL
  Filled 2014-07-31 (×2): qty 1

## 2014-07-31 MED ORDER — NITROGLYCERIN 0.2 MG/ML ON CALL CATH LAB
INTRAVENOUS | Status: DC | PRN
  Administered 2014-07-31: 300 ug via INTRA_ARTERIAL
  Administered 2014-07-31: 100 ug via INTRA_ARTERIAL

## 2014-07-31 MED ORDER — VERAPAMIL HCL 2.5 MG/ML IV SOLN
INTRAVENOUS | Status: AC
  Filled 2014-07-31: qty 2

## 2014-07-31 SURGICAL SUPPLY — 25 items
BALLN ANGIOSCULPT RX 2.5X6 (BALLOONS) ×2
BALLN ~~LOC~~ TREK RX 3.5X8 (BALLOONS) ×2
BALLOON ANGIOSCULPT RX 2.5X6 (BALLOONS) IMPLANT
BALLOON ~~LOC~~ TREK RX 3.5X8 (BALLOONS) IMPLANT
CATH INFINITI 5 FR IM (CATHETERS) ×1 IMPLANT
CATH INFINITI 5 FR JL3.5 (CATHETERS) ×1 IMPLANT
CATH INFINITI 5FR AL1 (CATHETERS) ×1 IMPLANT
CATH INFINITI 5FR ANG PIGTAIL (CATHETERS) ×1 IMPLANT
CATH INFINITI 5FR MULTPACK ANG (CATHETERS) ×1 IMPLANT
CATH INFINITI JR4 5F (CATHETERS) ×1 IMPLANT
DEVICE RAD COMP TR BAND LRG (VASCULAR PRODUCTS) ×2 IMPLANT
GLIDESHEATH SLEND SS 6F .021 (SHEATH) ×2 IMPLANT
GUIDE CATH RUNWAY 6FR CLS3 (CATHETERS) ×1 IMPLANT
KIT ENCORE 26 ADVANTAGE (KITS) ×1 IMPLANT
KIT HEART LEFT (KITS) ×2 IMPLANT
PACK CARDIAC CATHETERIZATION (CUSTOM PROCEDURE TRAY) ×2 IMPLANT
SHEATH PINNACLE 5F 10CM (SHEATH) IMPLANT
STENT XIENCE ALPINE RX 3.0X15 (Permanent Stent) ×1 IMPLANT
SYR MEDRAD MARK V 150ML (SYRINGE) ×2 IMPLANT
TRANSDUCER W/STOPCOCK (MISCELLANEOUS) ×2 IMPLANT
TUBING CIL FLEX 10 FLL-RA (TUBING) ×2 IMPLANT
VALVE GUARDIAN II ~~LOC~~ HEMO (MISCELLANEOUS) ×1 IMPLANT
WIRE ASAHI PROWATER 180CM (WIRE) ×1 IMPLANT
WIRE EMERALD 3MM-J .035X150CM (WIRE) IMPLANT
WIRE SAFE-T 1.5MM-J .035X260CM (WIRE) ×2 IMPLANT

## 2014-07-31 NOTE — Progress Notes (Signed)
TR BAND REMOVAL  LOCATION:    left radial  DEFLATED PER PROTOCOL:    Yes.    TIME BAND OFF / DRESSING APPLIED:    1830   SITE UPON ARRIVAL:    Level 0  SITE AFTER BAND REMOVAL:    Level 0  REVERSE ALLEN'S TEST:     positive  CIRCULATION SENSATION AND MOVEMENT:    Within Normal Limits   Yes.    COMMENTS:   Tolerated procedure well

## 2014-07-31 NOTE — Interval H&P Note (Signed)
Cath Lab Visit (complete for each Cath Lab visit)  Clinical Evaluation Leading to the Procedure:   ACS: Yes.    Non-ACS:    Anginal Classification: CCS IV  Anti-ischemic medical therapy: Minimal Therapy (1 class of medications)  Non-Invasive Test Results: No non-invasive testing performed  Prior CABG: Previous CABG   TIMI Score  Patient Information:  TIMI Score is 4   UA/NSTEMI and intermediate-risk features (e.g., TIMI score 3-4) for short-term risk of death or nonfatal MI  Revascularization of the presumed culprit artery   A (8)  Indication: 10; Score: 8    History and Physical Interval Note:  07/31/2014 10:46 AM  Benjamin Robertson  has presented today for surgery, with the diagnosis of unstable angina  The various methods of treatment have been discussed with the patient and family. After consideration of risks, benefits and other options for treatment, the patient has consented to  Procedure(s): Left Heart Cath and Cors/Grafts Angiography (N/A) as a surgical intervention .  The patient's history has been reviewed, patient examined, no change in status, stable for surgery.  I have reviewed the patient's chart and labs.  Questions were answered to the patient's satisfaction.     Adryana Mogensen S.

## 2014-07-31 NOTE — Progress Notes (Signed)
ANTICOAGULATION CONSULT NOTE - Follow Up Consult  Pharmacy Consult for Heparin Indication: chest pain/ACS  Allergies  Allergen Reactions  . Advicor [Niacin-Lovastatin Er] Other (See Comments)    Headache, possibly other reactions  . Lipitor [Atorvastatin] Other (See Comments)    Headache, possibly other reactions  . Pravachol [Pravastatin Sodium] Other (See Comments)    Headache, possibly other reactions    Patient Measurements: Height: 5\' 8"  (172.7 cm) Weight: 211 lb 9.6 oz (95.981 kg) IBW/kg (Calculated) : 68.4 Heparin Dosing Weight: 88.6  Vital Signs: Temp: 98.6 F (37 C) (05/20 0500) Temp Source: Oral (05/20 0500) BP: 130/63 mmHg (05/20 0500) Pulse Rate: 58 (05/20 0500)  Labs:  Recent Labs  07/30/14 1530 07/30/14 1935 07/30/14 2053 07/31/14 0148 07/31/14 0840  HGB 17.0  --   --  17.1*  --   HCT 48.1  --   --  49.3  --   PLT 209  --   --  193  --   LABPROT 13.9  --   --   --   --   INR 1.05  --   --   --   --   HEPARINUNFRC  --   --  0.10* 0.44 0.38  CREATININE 0.91  --   --   --   --   TROPONINI 0.07* 0.07*  --  0.06*  --     Estimated Creatinine Clearance: 93.3 mL/min (by C-G formula based on Cr of 0.91).  Assessment: AC: Unstable angina on heparin, no PTA AC, HDW 88.6 kg. HL 0.44 and 0.38 in goal. CBC ok  Goal of Therapy:  Heparin level 0.3-0.7 units/ml Monitor platelets by anticoagulation protocol: Yes   Plan:  Continue heparin at 1500 units/hr Will f/u after cath  Selinda Korzeniewski S. Alford Highland, PharmD, [facility] Clinical Staff Pharmacist Pager [phone]  Eilene Ghazi Stillinger 07/31/2014,10:10 AM

## 2014-07-31 NOTE — Progress Notes (Signed)
ANTICOAGULATION CONSULT NOTE - Follow Up Consult  Pharmacy Consult for heparin Indication: USAP   Labs:  Recent Labs  07/30/14 1530 07/30/14 1935 07/30/14 2053 07/31/14 0148  HGB 17.0  --   --  17.1*  HCT 48.1  --   --  49.3  PLT 209  --   --  193  LABPROT 13.9  --   --   --   INR 1.05  --   --   --   HEPARINUNFRC  --   --  0.10* 0.44  CREATININE 0.91  --   --   --   TROPONINI 0.07* 0.07*  --  0.06*      Assessment/Plan:  64yo male therapeutic on heparin after rate change though bolus was given late and lab drawn early so true level likely lower. Will continue gtt at current rate for now and confirm stable with additional level.   Wynona Neat, PharmD, BCPS  07/31/2014,3:06 AM

## 2014-08-01 ENCOUNTER — Inpatient Hospital Stay (HOSPITAL_COMMUNITY): Payer: BLUE CROSS/BLUE SHIELD

## 2014-08-01 ENCOUNTER — Encounter (HOSPITAL_COMMUNITY): Payer: Self-pay | Admitting: Radiology

## 2014-08-01 DIAGNOSIS — Z9861 Coronary angioplasty status: Secondary | ICD-10-CM

## 2014-08-01 LAB — BASIC METABOLIC PANEL
Anion gap: 10 (ref 5–15)
BUN: 14 mg/dL (ref 6–20)
CALCIUM: 9.2 mg/dL (ref 8.9–10.3)
CHLORIDE: 104 mmol/L (ref 101–111)
CO2: 23 mmol/L (ref 22–32)
Creatinine, Ser: 1.05 mg/dL (ref 0.61–1.24)
GLUCOSE: 95 mg/dL (ref 65–99)
Potassium: 4.6 mmol/L (ref 3.5–5.1)
Sodium: 137 mmol/L (ref 135–145)

## 2014-08-01 LAB — CBC
HEMATOCRIT: 48.5 % (ref 39.0–52.0)
HEMOGLOBIN: 16.8 g/dL (ref 13.0–17.0)
MCH: 32.1 pg (ref 26.0–34.0)
MCHC: 34.6 g/dL (ref 30.0–36.0)
MCV: 92.6 fL (ref 78.0–100.0)
PLATELETS: 195 10*3/uL (ref 150–400)
RBC: 5.24 MIL/uL (ref 4.22–5.81)
RDW: 12.9 % (ref 11.5–15.5)
WBC: 9.1 10*3/uL (ref 4.0–10.5)

## 2014-08-01 MED ORDER — TICAGRELOR 90 MG PO TABS: 90.0000 mg | ORAL_TABLET | Freq: Two times a day (BID) | ORAL | Status: DC

## 2014-08-01 MED ORDER — ASPIRIN 81 MG PO CHEW: 81.0000 mg | CHEWABLE_TABLET | Freq: Every day | ORAL | Status: DC

## 2014-08-01 MED ORDER — ACETAMINOPHEN 325 MG PO TABS
650.0000 mg | ORAL_TABLET | ORAL | Status: DC | PRN
Start: 2014-08-01 — End: 2014-09-10

## 2014-08-01 NOTE — Progress Notes (Signed)
CARDIAC REHAB PHASE I   PRE:  Rate/Rhythm: 62 SR  BP:  Sitting: 137/90        SaO2: 97 RA  MODE:  Ambulation: 500 ft   POST:  Rate/Rhythm: 86 SR  BP:  Sitting: 161/62         SaO2: 98 RA  Pt ambulated 500 ft on RA, independent, steady gait, tolerated well.  Pt denies cp, dizziness, DOE, declined rest stop. Completed MI/stent education with pt wife at bedside.  Reviewed anti-platelet therapy, stent card, activity restrictions, tobacco cessation, ntg, exercise, heart healthy diet, sodium restrictions, phase 2 cardiac rehab. Pt verbalized understanding. Pt declines phase 2 cardiac rehab, states he "has never been interested in it". Pt states he is "not going to make any changes" to his lifestyle. Pt not receptive to education. Pt to chair after walk, call bell within reach.   [phone]   Lenna Sciara, RN, BSN 08/01/2014 11:10 AM

## 2014-08-01 NOTE — Discharge Instructions (Signed)
Smoking Cessation Quitting smoking is important to your health and has many advantages. However, it is not always easy to quit since nicotine is a very addictive drug. Oftentimes, people try 3 times or more before being able to quit. This document explains the best ways for you to prepare to quit smoking. Quitting takes hard work and a lot of effort, but you can do it. ADVANTAGES OF QUITTING SMOKING  You will live longer, feel better, and live better.  Your body will feel the impact of quitting smoking almost immediately.  Within 20 minutes, blood pressure decreases. Your pulse returns to its normal level.  After 8 hours, carbon monoxide levels in the blood return to normal. Your oxygen level increases.  After 24 hours, the chance of having a heart attack starts to decrease. Your breath, hair, and body stop smelling like smoke.  After 48 hours, damaged nerve endings begin to recover. Your sense of taste and smell improve.  After 72 hours, the body is virtually free of nicotine. Your bronchial tubes relax and breathing becomes easier.  After 2 to 12 weeks, lungs can hold more air. Exercise becomes easier and circulation improves.  The risk of having a heart attack, stroke, cancer, or lung disease is greatly reduced.  After 1 year, the risk of coronary heart disease is cut in half.  After 5 years, the risk of stroke falls to the same as a nonsmoker.  After 10 years, the risk of lung cancer is cut in half and the risk of other cancers decreases significantly.  After 15 years, the risk of coronary heart disease drops, usually to the level of a nonsmoker.  If you are pregnant, quitting smoking will improve your chances of having a healthy baby.  The people you live with, especially any children, will be healthier.  You will have extra money to spend on things other than cigarettes. QUESTIONS TO THINK ABOUT BEFORE ATTEMPTING TO QUIT You may want to talk about your answers with your  health care provider.  Why do you want to quit?  If you tried to quit in the past, what helped and what did not?  What will be the most difficult situations for you after you quit? How will you plan to handle them?  Who can help you through the tough times? Your family? Friends? A health care provider?  What pleasures do you get from smoking? What ways can you still get pleasure if you quit? Here are some questions to ask your health care provider:  How can you help me to be successful at quitting?  What medicine do you think would be best for me and how should I take it?  What should I do if I need more help?  What is smoking withdrawal like? How can I get information on withdrawal? GET READY  Set a quit date.  Change your environment by getting rid of all cigarettes, ashtrays, matches, and lighters in your home, car, or work. Do not let people smoke in your home.  Review your past attempts to quit. Think about what worked and what did not. GET SUPPORT AND ENCOURAGEMENT You have a better chance of being successful if you have help. You can get support in many ways.  Tell your family, friends, and coworkers that you are going to quit and need their support. Ask them not to smoke around you.  Get individual, group, or telephone counseling and support. Programs are available at [employer] and health centers. Call  your local health department for information about programs in your area.  Spiritual beliefs and practices may help some smokers quit.  Download a "quit meter" on your computer to keep track of quit statistics, such as how long you have gone without smoking, cigarettes not smoked, and money saved.  Get a self-help book about quitting smoking and staying off tobacco. [place] yourself from urges to smoke. Talk to someone, go for a walk, or occupy your time with a task.  Change your normal routine. Take a different route to work.  Drink tea instead of coffee. Eat breakfast in a different place.  Reduce your stress. Take a hot bath, exercise, or read a book.  Plan something enjoyable to do every day. Reward yourself for not smoking.  Explore interactive web-based programs that specialize in helping you quit. GET MEDICINE AND USE IT CORRECTLY Medicines can help you stop smoking and decrease the urge to smoke. Combining medicine with the above behavioral methods and support can greatly increase your chances of successfully quitting smoking.  Nicotine replacement therapy helps deliver nicotine to your body without the negative effects and risks of smoking. Nicotine replacement therapy includes nicotine gum, lozenges, inhalers, nasal sprays, and skin patches. Some may be available over-the-counter and others require a prescription.  Antidepressant medicine helps people abstain from smoking, but how this works is unknown. This medicine is available by prescription.  Nicotinic receptor partial agonist medicine simulates the effect of nicotine in your brain. This medicine is available by prescription. Ask your health care provider for advice about which medicines to use and how to use them based on your health history. Your health care provider will tell you what side effects to look out for if you choose to be on a medicine or therapy. Carefully read the information on the package. Do not use any other product containing nicotine while using a nicotine replacement product.  RELAPSE OR DIFFICULT SITUATIONS Most relapses occur within the first 3 months after quitting. Do not be discouraged if you start smoking again. Remember, most people try several times before finally quitting. You may have symptoms of withdrawal because your body is used to nicotine. You may crave cigarettes, be irritable, feel very hungry, cough often, get headaches, or have difficulty concentrating. The withdrawal symptoms are only temporary. They are strongest  when you first quit, but they will go away within 10-14 days. To reduce the chances of relapse, try to:  Avoid drinking alcohol. Drinking lowers your chances of successfully quitting.  Reduce the amount of caffeine you consume. Once you quit smoking, the amount of caffeine in your body increases and can give you symptoms, such as a rapid heartbeat, sweating, and anxiety.  Avoid smokers because they can make you want to smoke.  Do not let weight gain distract you. Many smokers will gain weight when they quit, usually less than 10 pounds. Eat a healthy diet and stay active. You can always lose the weight gained after you quit.  Find ways to improve your mood other than smoking. FOR MORE INFORMATION  www.smokefree.gov  Document Released: 02/21/2001 Document Revised: 07/14/2013 Document Reviewed: 06/08/2011 [facility] Patient Information 2015 [place], [place]. This information is not intended to replace advice given to you by your health care provider. Make sure you discuss any questions you have with your health care provider. Coronary Angiogram With Stent, Care After Refer to this sheet in the next few weeks. These instructions provide you with information on  caring for yourself after your procedure. Your health care provider may also give you more specific instructions. Your treatment has been planned according to current medical practices, but problems sometimes occur. Call your health care provider if you have any problems or questions after your procedure.  WHAT TO EXPECT AFTER THE PROCEDURE  The insertion site may be tender for a few days after your procedure. HOME CARE INSTRUCTIONS   Take medicines only as directed by your health care provider. Blood thinners may be prescribed after your procedure to improve blood flow through the stent.  Change any bandages (dressings) as directed by your health care provider.   Check your insertion site every day for redness, swelling, or fluid leaking  from the insertion.   Do not take baths, swim, or use a hot tub until your health care provider approves. You may shower. Pat the insertion area dry. Do not rub the insertion area with a washcloth or towel.   Eat a heart-healthy diet. This should include plenty of fresh fruits and vegetables. Meat should be lean cuts. Avoid the following types of food:   Food that is high in salt.   Canned or highly processed food.   Food that is high in saturated fat or sugar.   Fried food.   Make any other lifestyle changes recommended by your health care provider. This may include:   Not using any tobacco products including cigarettes, chewing tobacco, or electronic cigarettes.  Managing your weight.   Getting regular exercise.   Managing your blood pressure.   Limiting your alcohol intake.   Managing other health problems, such as diabetes.   If you need an MRI after your heart stent was placed, be sure to tell the health care provider who orders the MRI that you have a heart stent.   Keep all follow-up visits as directed by your health care provider.  SEEK IMMEDIATE MEDICAL CARE IF:   You develop chest pain, shortness of breath, feel faint, or pass out.  You have bleeding, swelling larger than a walnut, or drainage from the catheter insertion site.  You develop pain, discoloration, coldness, or severe bruising in the leg or arm that held the catheter.  You develop bleeding from any other place such as from the bowels. There may be bright red blood in the urine or stools, or it may appear as black, tarry stools.  You have a fever or chills. MAKE SURE YOU:  Understand these instructions.  Will watch your condition.  Will get help right away if you are not doing well or get worse. Document Released: 09/16/2004 Document Revised: 07/14/2013 Document Reviewed: 07/31/2012 [facility] Patient Information 2015 [place], [place]. This information is not intended to replace  advice given to you by your health care provider. Make sure you discuss any questions you have with your health care provider.

## 2014-08-01 NOTE — Care Management Note (Signed)
Case Management Note  Patient Details  Name: Benjamin Robertson MRN: [id number] Date of Birth: 1950-09-24  Subjective/Objective:                   Unstable angina Action/Plan: Discharge planning  Expected Discharge Date:  08/01/14                Expected Discharge Plan:  Home/Self Care  In-House Referral:     Discharge planning Services  CM Consult, Medication Assistance  Post Acute Care Choice:    Choice offered to:     DME Arranged:    DME Agency:     HH Arranged:    [place] Agency:     Status of Service:  Completed, signed off  Medicare Important Message Given:    Date Medicare IM Given:    Medicare IM give by:    Date Additional Medicare IM Given:    Additional Medicare Important Message give by:     If discussed at [place] of Stay Meetings, dates discussed:    Additional Comments: ga ve pt free 30 day trial card to pay for today's prescription of brilinta.  Pt verbalized understanding this 30 days will give the office staff at his follow up appt time to have the medicaiton authorized by insurance and refills copay will be either the $18 dollars of the secondary card or less.  No other CM needs were communicated.    Dellie Catholic, RN 08/01/2014, 9:43 AM

## 2014-08-01 NOTE — Discharge Summary (Signed)
Patient ID: Benjamin Robertson,  MRN: [id number], DOB/AGE: [birth year] 64 y.o.  Admit date: 07/30/2014 Discharge date: 08/01/2014  Primary Care Provider: No PCP Per Patient Primary Cardiologist: Dr Ellyn Hack  Discharge Diagnoses Principal Problem:   Unstable angina Active Problems:   NSTEMI (non-ST elevated myocardial infarction)   CAD S/P PCI - DES PCI to prox Cx XIENCE ALPINE RX 3.0X15    CAD in native artery - status post CABG x3 after PCI x2 to the RCA (LIMA-LAD, SVG-diagonal SVG-RI   Cigarette smoker one half pack a day or less   Essential hypertension   Hyperlipidemia with target LDL less than 70   Obesity (BMI 30.0-34.9)    Procedures:  Cath/ native CFX DES 07/31/14   Hospital Course:  64 y.o. male with a PMH below who was recently seen by Dr. Ellyn Hack for a 6 month followup for CAD with moderate ischemic cardiomyopathy. He is a former patient of Dr. Tami Ribas from the time of his initial MI in 46. who then saw Dr. Terance Ice until his retirement. He had a second MI in '97, and was referred for CABG in 2001.He underwent three-vessel bypass in 2001 with LIMA to LAD, SVG to diagonal and SVG to ramus intermedius.  His last stress test was in 2011 showed evidence of a inferior infarct, but no ischemia. He was seen in the office 07/30/14 by Dr Debara Pickett with complaints of chest pain cw [place]. He was admitted for cath 07/30/14 and placed on IV Heaprin. His case was delayed secondary to an emergency case. He did rule in with a pk Troponin of 0.07. Cath was done 07/31/14 and results are listed below.   Severe native vessel coronary disease involving the LAD, ramus and diagonal. The LIMA to LAD, SVG to ramus and SVG to diagonal are all widely patent.  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 This was the culprit for his non-STEMI. This was successfully treated with a 3.0 x 15 Xience drug-eluting stent, postdilated to 3.6 mm in diameter.  He tolerated this well  and we feel he can be discharged 08/01/14. We did do a CXR before discharge as he is an active long time smoker and has not had a CXR in the last couple of years. Results are pending and will need to be f/u when he is seen in the office next week.    Discharge Vitals:  Blood pressure 116/67, pulse 70, temperature 99.1 F (37.3 C), temperature source Oral, resp. rate 16, height 5\' 8"  (1.727 m), weight 204 lb 5.9 oz (92.7 kg), SpO2 96 %.  Chest: Clear CV: RRR without murmur Lt radial site without hematoma  Labs: Results for orders placed or performed during the hospital encounter of 07/30/14 (from the past 24 hour(s))  POCT Activated clotting time     Status: None   Collection Time: 07/31/14 11:48 AM  Result Value Ref Range   Activated Clotting Time 288 seconds  POCT Activated clotting time     Status: None   Collection Time: 07/31/14 12:12 PM  Result Value Ref Range   Activated Clotting Time 306 seconds  Basic metabolic panel     Status: None   Collection Time: 08/01/14  2:53 AM  Result Value Ref Range   Sodium 137 135 - 145 mmol/L   Potassium 4.6 3.5 - 5.1 mmol/L   Chloride 104 101 - 111 mmol/L   CO2 23 22 - 32 mmol/L   Glucose, Bld 95  65 - 99 mg/dL   BUN 14 6 - 20 mg/dL   Creatinine, Ser 1.05 0.61 - 1.24 mg/dL   Calcium 9.2 8.9 - 10.3 mg/dL   GFR calc non Af Amer >60 >60 mL/min   GFR calc Af Amer >60 >60 mL/min   Anion gap 10 5 - 15  CBC     Status: None   Collection Time: 08/01/14  2:53 AM  Result Value Ref Range   WBC 9.1 4.0 - 10.5 K/uL   RBC 5.24 4.22 - 5.81 MIL/uL   Hemoglobin 16.8 13.0 - 17.0 g/dL   HCT 48.5 39.0 - 52.0 %   MCV 92.6 78.0 - 100.0 fL   MCH 32.1 26.0 - 34.0 pg   MCHC 34.6 30.0 - 36.0 g/dL   RDW 12.9 11.5 - 15.5 %   Platelets 195 150 - 400 K/uL    Disposition:      Follow-up Information    Follow up with Leonie Man, MD.   Specialty:  Cardiology   Why:  office will contact you   Contact information:   [address] [place] [address] [phone]       Discharge Medications:    Medication List    STOP taking these medications        aspirin 325 MG tablet  Replaced by:  aspirin 81 MG chewable tablet      TAKE these medications        acetaminophen 325 MG tablet  Commonly known as:  TYLENOL  Take 2 tablets (650 mg total) by mouth every 4 (four) hours as needed for headache or mild pain.     aspirin 81 MG chewable tablet  Chew 1 tablet (81 mg total) by mouth daily.     metoprolol 50 MG tablet  Commonly known as:  LOPRESSOR  TAKE 1/2 TABLET BY MOUTH 2 TIMES A DAY     nitroGLYCERIN 0.4 MG/SPRAY spray  Commonly known as:  NITROLINGUAL  Place 1 spray under the tongue every 5 (five) minutes x 3 doses as needed for chest pain.     potassium citrate 10 MEQ (1080 MG) SR tablet  Commonly known as:  UROCIT-K  Take 10 mEq by mouth 2 (two) times daily.     ramipril 5 MG capsule  Commonly known as:  ALTACE  TAKE ONE CAPSULE EVERY DAY     simvastatin 40 MG tablet  Commonly known as:  ZOCOR  Take 40 mg by mouth daily at 6 PM.     tamsulosin 0.4 MG Caps capsule  Commonly known as:  FLOMAX  Take 1 capsule (0.4 mg total) by mouth daily.     ticagrelor 90 MG Tabs tablet  Commonly known as:  BRILINTA  Take 1 tablet (90 mg total) by mouth 2 (two) times daily.      ASK your doctor about these medications        atorvastatin 40 MG tablet  Commonly known as:  LIPITOR  Take 1 tablet (40 mg total) by mouth daily.         Duration of Discharge Encounter: Greater than 30 minutes including physician time.  Signed, Kerin Ransom PA-C 08/01/2014 9:03 AM    I have seen, examined the patient, and reviewed the above assessment and plan. On exam, RRR  Changes to above are made where necessary.  DC to home with follow-up as above.  CXR is pending.  Co Sign: Thompson Grayer, MD 08/01/2014 10:28 AM

## 2014-08-03 ENCOUNTER — Telehealth: Payer: Self-pay | Admitting: Internal Medicine

## 2014-08-03 ENCOUNTER — Telehealth: Payer: Self-pay | Admitting: Cardiology

## 2014-08-03 MED FILL — Heparin Sodium (Porcine) 2 Unit/ML in Sodium Chloride 0.9%: INTRAMUSCULAR | Qty: 1000 | Status: AC

## 2014-08-03 MED FILL — Heparin Sodium (Porcine) Inj 1000 Unit/ML: INTRAMUSCULAR | Qty: 10 | Status: AC

## 2014-08-03 MED FILL — Cangrelor Tetrasodium For IV Soln 50 MG: INTRAVENOUS | Qty: 50 | Status: AC

## 2014-08-03 MED FILL — Lidocaine HCl Local Preservative Free (PF) Inj 1%: INTRAMUSCULAR | Qty: 30 | Status: AC

## 2014-08-03 NOTE — Telephone Encounter (Signed)
Pt needs a TOC phone call  Thanks

## 2014-08-03 NOTE — Telephone Encounter (Signed)
Pt's wife is returning Sharon's call  thanks

## 2014-08-03 NOTE — Telephone Encounter (Signed)
Left message answer machine to call back  Discharge form 08/01/14  [facility]  f/u 5/57/16 at  2 pm with Dr Percival Spanish

## 2014-08-03 NOTE — Telephone Encounter (Signed)
Closed encounter °

## 2014-08-03 NOTE — Telephone Encounter (Signed)
Patient contacted regarding discharge from [facility] on 08/01/14.  Patient understands to follow up with provider Dr Percival Spanish on 08/07/14 at 2 PM at [facility]. Patient understands discharge instructions? yes Patient understands medications and regiment? yes  Patient understands to bring all medications to this visit? yes   SPOKE TO WIFE.SHE RETURN CALLED.

## 2014-08-07 ENCOUNTER — Encounter: Payer: Self-pay | Admitting: Cardiology

## 2014-08-07 ENCOUNTER — Ambulatory Visit (INDEPENDENT_AMBULATORY_CARE_PROVIDER_SITE_OTHER): Payer: BLUE CROSS/BLUE SHIELD | Admitting: Cardiology

## 2014-08-07 VITALS — BP 116/67 | HR 80 | Ht 68.0 in | Wt 214.0 lb

## 2014-08-07 DIAGNOSIS — I2510 Atherosclerotic heart disease of native coronary artery without angina pectoris: Secondary | ICD-10-CM

## 2014-08-07 DIAGNOSIS — Z9861 Coronary angioplasty status: Secondary | ICD-10-CM | POA: Diagnosis not present

## 2014-08-07 NOTE — Progress Notes (Signed)
Cardiology Office Note   Date:  08/07/2014   ID:  Benjamin Robertson, DOB 03/16/1950, MRN [id number]  PCP:  No PCP Per Patient  Cardiologist:   Dr. Ellyn Hack   No chief complaint on file.     History of Present Illness: Benjamin Robertson is a 64 y.o. male who presents for transition of care appointment following a non-STEMI and drug-eluting stent placement to a bypass graft. He presented with chest discomfort a week ago. He was found to have severe native three-vessel coronary disease. LIMA to the LAD, SVG to the ramus intermediate and SVG to the diagonal were widely patent. He had an ostial circumflex lesion that had not been previously bypassed. This was high-grade and treated with a 3.0 x 15 Xience drug-eluting stent.  Since going home he has done well. He did have one night of shortness of breath probably related to his Brilinta. However, he's not otherwise been bothered by this. He's had none of the chest discomfort that he had prior to admission. He walked to his mailbox. He's had no chest pressure, neck or arm discomfort. He's had no palpitations, presyncope or syncope. Denies any PND or orthopnea.   Past Medical History  Diagnosis Date  . CAD in native artery 1994, 2001    a) 2001 Exertional Angina --> Myoview: Inf infarct and Ant ischemia --> Cath: 95% pLAD, 99% D1, 80% RI, but Cx-OM1&2 OK , & patent RCA stent --> CABG: b) Cath in 2003 patent grafts, RCA & Cx; c) 08/2009 Myoview: Mod Basal-Mid inferoseptal, Basal-apical inferior Infarct. No ischemia.   . S/P CABG x 3 2001    LIMA-LAD, SVG-diagonal, SVG-Ramus Intermedius (RI) - patent by Cath in 2003  . Ischemic cardiomyopathy 1994, 2003    Echo February 2014: EF 40-45%; moderate HK of anterolateral myocardium.; Grade 1 diastolic dysfunction  . Hyperlipidemia LDL goal <70     At goal by Most recent labs October 2013: TC 123, TG 83, HDL 81, LDL 65  . Obesity (BMI 30.0-34.9)   . History of ST elevation myocardial infarction (STEMI) of inferior  wall 1994; 1997    PCI of RCA - redo PCI BMS ML Vision 3.0 mm x 25 mm to RCA in 1997  . Childhood asthma   . Pneumonia 2014  . Arthritis     "qwhere" (07/30/2014)  . Kidney stones   . Hypertension     Past Surgical History  Procedure Laterality Date  . Coronary artery bypass graft  06/07/1999    x3. LIMA to distal LAD, SVG to first diag, and SVG to ramus  . Nm myoview ltd  08/19/2009    Moderafte perfusion seen in Basal inferoseptal, Basal inferior, Mid inferoseptal, Mid inferiorm, and Apical inferior regions. No ECG changes. EKG negative for ischemia.  . Transthoracic echocardiogram  05/06/2012    EF 40-45%, LV cavity moderately dilated, systolic function mild-moderately reduced, moderate hypokinesis of anterolatewral myocardium.  . Carotid doppler  05/06/2012    Less than 50% diameter reduction of the Lft Subclavian.  . Lithotripsy  "2-3 times"  . Coronary angioplasty with stent placement  01/21/1996    PTCA of RCA-95% lesion  . Cardiac catheterization  06/06/1999    Recommend CABG  . Cardiac catheterization  10/04/2001    Patent grafts with a 50% lesion in LAD beyond IMA insertion  . Cardiac catheterization N/A 07/31/2014    Procedure: Left Heart Cath and Cors/Grafts Angiography;  Surgeon: Jettie Booze, MD;  Location: [facility];  Service: Cardiovascular;  Laterality: N/A;  . Cardiac catheterization N/A 07/31/2014    Procedure: Coronary Stent Intervention;  Surgeon: Jettie Booze, MD;  Location: [facility];  Service: Cardiovascular;  Laterality: N/A;     Current Outpatient Prescriptions  Medication Sig Dispense Refill  . acetaminophen (TYLENOL) 325 MG tablet Take 2 tablets (650 mg total) by mouth every 4 (four) hours as needed for headache or mild pain.    Marland Kitchen aspirin 81 MG chewable tablet Chew 1 tablet (81 mg total) by mouth daily.    Marland Kitchen atorvastatin (LIPITOR) 40 MG tablet Take 1 tablet (40 mg total) by mouth daily. 30 tablet 5  . metoprolol (LOPRESSOR) 50  MG tablet TAKE 1/2 TABLET BY MOUTH 2 TIMES A DAY 90 tablet 2  . nitroGLYCERIN (NITROLINGUAL) 0.4 MG/SPRAY spray Place 1 spray under the tongue every 5 (five) minutes x 3 doses as needed for chest pain.    . potassium citrate (UROCIT-K) 10 MEQ (1080 MG) SR tablet Take 10 mEq by mouth 2 (two) times daily.  11  . ramipril (ALTACE) 5 MG capsule TAKE ONE CAPSULE EVERY DAY 90 capsule 2  . tamsulosin (FLOMAX) 0.4 MG CAPS capsule Take 1 capsule (0.4 mg total) by mouth daily. 90 capsule 3  . ticagrelor (BRILINTA) 90 MG TABS tablet Take 1 tablet (90 mg total) by mouth 2 (two) times daily. 180 tablet 3   No current facility-administered medications for this visit.    Allergies:   Advicor; Lipitor; and Pravachol    ROS:  Please see the history of present illness.   Otherwise, review of systems are positive for none.   All other systems are reviewed and negative.    PHYSICAL EXAM: VS:  BP 116/67 mmHg  Pulse 80  Ht 5\' 8"  (1.727 m)  Wt 214 lb (97.07 kg)  BMI 32.55 kg/m2 , BMI Body mass index is 32.55 kg/(m^2). GENERAL:  Well appearing HEENT:  Pupils equal round and reactive, fundi not visualized, oral mucosa unremarkable NECK:  No jugular venous distention, waveform within normal limits, carotid upstroke brisk and symmetric, bilateral bruits, no thyromegaly LYMPHATICS:  No cervical, inguinal adenopathy LUNGS:  Clear to auscultation bilaterally BACK:  No CVA tenderness CHEST:  Unremarkable HEART:  PMI not displaced or sustained,S1 and S2 within normal limits, no S3, no S4, no clicks, no rubs, no murmurs ABD:  Flat, positive bowel sounds normal in frequency in pitch, no bruits, no rebound, no guarding, no midline pulsatile mass, no hepatomegaly, no splenomegaly EXT:  2 plus pulses throughout, no edema, no cyanosis no clubbing, left wrist with mild bruising at the catheterization site but otherwise unremarkable SKIN:  No rashes no nodules NEURO:  Cranial nerves II through XII grossly intact, motor  grossly intact throughout PSYCH:  Cognitively intact, oriented to person place and time    EKG:  EKG is not ordered today.   Recent Labs: 07/30/2014: ALT 19 08/01/2014: BUN 14; Creatinine 1.05; Hemoglobin 16.8; Platelets 195; Potassium 4.6; Sodium 137    Lipid Panel    Component Value Date/Time   CHOL 131 07/31/2014 0148   TRIG 100 07/31/2014 0148   HDL 32* 07/31/2014 0148   CHOLHDL 4.1 07/31/2014 0148   VLDL 20 07/31/2014 0148   LDLCALC 79 07/31/2014 0148      Wt Readings from Last 3 Encounters:  08/07/14 214 lb (97.07 kg)  08/01/14 204 lb 5.9 oz (92.7 kg)  07/30/14 215 lb 3.2 oz (97.614 kg)  Other studies Reviewed: Additional studies/ records that were reviewed today include: Hospital records. Review of the above records demonstrates:  Please see elsewhere in the note.     ASSESSMENT AND PLAN:  NSTEMI: The patient is doing well. No further cardiovascular testing is indicated. He needs aggressive risk reduction. All questions were answered and medications reviewed. No change in therapy is indicated.  TOBACCO ABUSE:  He still smoking. We discussed this. He understands the need to quit smoking. He doesn't want therapies at this time. He has cut back.  Of note his chest after a was pending at time of his discharge. I reviewed this with him. There were no overt abnormalities. However, he may want to discuss further with Dr. Ellyn Hack screening with a CAT scan in the future because of his smoking history.  DYSLIPIDEMIA:  His lipids were at target. He is on moderate dose of statin which is reasonable. No change in therapy is indicated.   Current medicines are reviewed at length with the patient today.  The patient does not have concerns regarding medicines.  The following changes have been made:  no change  Labs/ tests ordered today include: None     Disposition:   FU with Dr. Ellyn Hack.      Signed, Minus Breeding, MD  08/07/2014 1:55 PM    [place]

## 2014-08-07 NOTE — Patient Instructions (Signed)
Your physician recommends that you schedule a follow-up appointment in 3 months with Dr. Harding   

## 2014-08-11 ENCOUNTER — Telehealth: Payer: Self-pay | Admitting: Cardiology

## 2014-08-11 MED ORDER — SIMVASTATIN 40 MG PO TABS: 40.0000 mg | ORAL_TABLET | Freq: Every day | ORAL | Status: DC

## 2014-08-11 MED ORDER — EZETIMIBE 10 MG PO TABS: 10.0000 mg | ORAL_TABLET | Freq: Every day | ORAL | Status: DC

## 2014-08-11 NOTE — Telephone Encounter (Signed)
Returned call to patient's wife.She stated husband is having side effects from [place].Stated this past Friday night he gets sob,hard to get a good breath at times.Also having joint pain.Advised Lipitor may be causing joint pain.Dr.Harding not in office will send message to him for advice.

## 2014-08-11 NOTE — Telephone Encounter (Signed)
Pt's wife is calling in because the pt is having some issues regarding his blood thinner. Please call

## 2014-08-11 NOTE — Telephone Encounter (Signed)
We can try to do Simvastatin 40 mg + Zetia (had to stop Vytorin before)-- would prefer Crestor 20 mg.  [facility]

## 2014-08-11 NOTE — Telephone Encounter (Signed)
Returned call to patient's wife Dr.Harding advised to stop lipitor.Start simvastatin 40 mg daily with zetia 10 mg daily.Advised to cal back if needed.

## 2014-08-11 NOTE — Telephone Encounter (Signed)
Try taking Brilinta with caffeine.  This is a known side effect - but not dangerous. Usually goes away.  With recent PCI - lets stay on Lipitor until we can discuss in f/u.  [place]

## 2014-08-11 NOTE — Telephone Encounter (Signed)
Returned call to patient's wife.Dr.Harding advised to take Brilinta with caffeine,known side effect.Usually goes away.Advised to continue Lipitor and will discuss at office visit.Stated office visit with Dr.Harding is 11/09/14.Stated husband having a lot of joint pain in knuckles,wrist,elbows,anklies.Advised I will send message back to [place] for advice.

## 2014-08-13 ENCOUNTER — Telehealth: Payer: Self-pay | Admitting: *Deleted

## 2014-08-13 NOTE — Telephone Encounter (Signed)
Pt. Was at work when i called , so i informed her that he really needed to give the medication a couple more weeks and the sob should go away

## 2014-09-08 ENCOUNTER — Telehealth: Payer: Self-pay | Admitting: Cardiology

## 2014-09-08 MED ORDER — NITROGLYCERIN 0.4 MG/SPRAY TL SOLN: 1.0000 | Status: DC | PRN

## 2014-09-08 NOTE — Telephone Encounter (Signed)
°  1. Which medications need to be refilled? Nitroglycerin Spray-no pains at present  2. Which pharmacy is medication to be sent to?[phone]  3. Do they need a 30 day or 90 day supply? 1 bottle  4. Would they like a call back once the medication has been sent to the pharmacy? no

## 2014-09-08 NOTE — Telephone Encounter (Signed)
Refill submitted to patient's preferred pharmacy.  

## 2014-09-09 ENCOUNTER — Telehealth: Payer: Self-pay | Admitting: Cardiology

## 2014-09-09 NOTE — Telephone Encounter (Signed)
Pt can not take the Brilinta,it is making him very short of breath. It also makes him real tired.he needs something else.

## 2014-09-09 NOTE — Telephone Encounter (Signed)
SPOKE TO WIFE SHE STATES PATIENT IS COMPLAINING OF BEING SHORT OF BREATH , CONSTANTLY FATIGUE. PATIENT DOES NOT WANT TO CONTINUE TAKING BRILITINTA WIFE STATES HE AS TRIED DRINKING CAFFEINE WITH SHORTNESS OF BREATH - WITH NO SUCCESS. APPOINTMENT MADE FOR 09/09/14 AT 3 PM WIFE WILL INFORM PATIENT.

## 2014-09-10 ENCOUNTER — Ambulatory Visit (INDEPENDENT_AMBULATORY_CARE_PROVIDER_SITE_OTHER): Payer: BLUE CROSS/BLUE SHIELD | Admitting: Cardiology

## 2014-09-10 ENCOUNTER — Encounter: Payer: Self-pay | Admitting: Cardiology

## 2014-09-10 VITALS — BP 150/68 | HR 75 | Ht 68.0 in | Wt 210.2 lb

## 2014-09-10 DIAGNOSIS — R0609 Other forms of dyspnea: Secondary | ICD-10-CM

## 2014-09-10 DIAGNOSIS — I10 Essential (primary) hypertension: Secondary | ICD-10-CM

## 2014-09-10 DIAGNOSIS — E785 Hyperlipidemia, unspecified: Secondary | ICD-10-CM

## 2014-09-10 DIAGNOSIS — Z955 Presence of coronary angioplasty implant and graft: Secondary | ICD-10-CM

## 2014-09-10 DIAGNOSIS — I214 Non-ST elevation (NSTEMI) myocardial infarction: Secondary | ICD-10-CM

## 2014-09-10 DIAGNOSIS — I2510 Atherosclerotic heart disease of native coronary artery without angina pectoris: Secondary | ICD-10-CM

## 2014-09-10 DIAGNOSIS — I255 Ischemic cardiomyopathy: Secondary | ICD-10-CM

## 2014-09-10 DIAGNOSIS — F17210 Nicotine dependence, cigarettes, uncomplicated: Secondary | ICD-10-CM

## 2014-09-10 DIAGNOSIS — Z9861 Coronary angioplasty status: Secondary | ICD-10-CM

## 2014-09-10 DIAGNOSIS — E669 Obesity, unspecified: Secondary | ICD-10-CM

## 2014-09-10 MED ORDER — PRASUGREL HCL 10 MG PO TABS: 10.0000 mg | ORAL_TABLET | Freq: Every day | ORAL | Status: DC

## 2014-09-10 NOTE — Progress Notes (Signed)
PCP: No PCP Per Patient  Clinic Note: Chief Complaint  Patient presents with  . Follow-up    issue with med-brilinta;no chest pain, shortness of breath-all the time, no edema, no pain in legs, no cramping in legs, no lightheadedness, no dizziness  . Coronary Artery Disease    recent PCI     HPI: Benjamin Robertson is a 64 y.o. male with a PMH below who presents today for work-in Post-PCI visit. He is a former patient of Dr. Tami Ribas from the time of his initial MI in 58 -- h/o CADB x 3 in 2001.. who then saw Dr. Terance Ice until his retirement.  H/o CAD & Ischemic Cardiomyopathy. I last saw him in April & he was doing quite well, but was supposed to have a Myoview checked.  Interval History:  Saw Dr.Hilty 5/19/'16 --> Cath (? ACS/Crescendo Angina - mild Troponin elevation) -- had noted severe anginal Sx ~3 d prior to his visit, lasted ~45 min.  Came in for visit after a second episode & feeling poorly when goind to work.  EKG no acute changes  Severe native vessel coronary disease involving the LAD, ramus and diagonal. The LIMA to LAD, SVG to ramus and SVG to diagonal are all widely patent.   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 This was the culprit for his ACS.   This was successfully treated with a 3.0 x 15 Xience drug-eluting stent, postdilated to 3.6 mm in diameter.   CXR: 1. Central bronchitic change and mild interstitial prominence are favored to reflect chronic changes. In the appropriate clinical setting, atypical infection could appear similar. 2. Aortic atherosclerosis.  3.. Multilevel degenerative disc disease and endplate spurring throughout the visualized spine. --> consider  CT Scan in future.  Dr. Percival Spanish in f/u 5/27/'16: - c/o SOB with Brilinta.  Denied any anginal Sx. Did not tolerate Atorvastatin - converted back to Simvastatin  Called in with more dyspnea spells related to Brilinta on 5/30 -- advised to take with  caffeine  He comes in today noting significant sob - that is persistent.  He firmly believes that it is related to [place]. He denies any anginal CP either with rest or with exertion. Dyspnea is not necessarily associated with or worsened by exertion. No PND, orthopnea or edema. No palpitations, lightheadedness, dizziness, weakness or syncope/near syncope. No TIA/amaurosis fugax symptoms. No melena, hematochezia, hematuria, or epstaxis. No claudication.  Past Medical History  Diagnosis Date  . CAD in native artery 1994, 2001    a) 2001 Exertional Angina --> Myoview: Inf infarct and Ant ischemia --> Cath: 95% pLAD, 99% D1, 80% RI, but Cx-OM1&2 OK , & patent RCA stent --> CABG: b) Cath in 2003 patent grafts, RCA & Cx; c) 08/2009 Myoview: Mod Basal-Mid inferoseptal, Basal-apical inferior Infarct. No ischemia.   . S/P CABG x 3 2001    LIMA-LAD, SVG-diagonal, SVG-Ramus Intermedius (RI) - patent by Cath in 2003  . Ischemic cardiomyopathy 1994, 2003    Echo February 2014: EF 40-45%; moderate HK of anterolateral myocardium.; Grade 1 diastolic dysfunction  . Hyperlipidemia LDL goal <70     At goal by Most recent labs October 2013: TC 123, TG 83, HDL 81, LDL 65  . Obesity (BMI 30.0-34.9)   . History of ST elevation myocardial infarction (STEMI) of inferior wall 1994; 1997    PCI of RCA - redo PCI BMS ML Vision 3.0 mm x 25 mm to RCA in 1997  .  Childhood asthma   . Pneumonia 2014  . Arthritis     "qwhere" (07/30/2014)  . Kidney stones   . Hypertension     Prior Cardiac Evaluation and Past Surgical History: Past Surgical History  Procedure Laterality Date  . Coronary artery bypass graft  06/07/1999    x3. LIMA to distal LAD, SVG to first diag, and SVG to ramus  . Nm myoview ltd  08/19/2009    Moderafte perfusion seen in Basal inferoseptal, Basal inferior, Mid inferoseptal, Mid inferiorm, and Apical inferior regions. No ECG changes. EKG negative for ischemia.  . Transthoracic echocardiogram   05/06/2012    EF 40-45%, LV cavity moderately dilated, systolic function mild-moderately reduced, moderate hypokinesis of anterolatewral myocardium.  . Carotid doppler  05/06/2012    Less than 50% diameter reduction of the Lft Subclavian.  . Lithotripsy  "2-3 times"  . Coronary angioplasty with stent placement  01/21/1996    PTCA of RCA-95% lesion  . Cardiac catheterization  06/06/1999    Recommend CABG  . Cardiac catheterization  10/04/2001    Patent grafts with a 50% lesion in LAD beyond IMA insertion  . Cardiac catheterization N/A 07/31/2014    Procedure: Left Heart Cath and Cors/Grafts Angiography;  Surgeon: Jettie Booze, MD;  Location: [facility];  Service: Cardiovascular;  Laterality: N/A;  . Cardiac catheterization N/A 07/31/2014    Procedure: Coronary Stent Intervention;  Surgeon: Jettie Booze, MD;  Location: [facility];  Service: Cardiovascular;  Laterality: N/A;    ROS: A comprehensive was performed. Pertinent Sx noted in HPI. Review of Systems  HENT: Negative for congestion and nosebleeds.   Respiratory: Positive for cough (chronic) and shortness of breath. Negative for wheezing.   Gastrointestinal: Negative for blood in stool and melena.  Genitourinary: Negative for hematuria.  Musculoskeletal: Positive for joint pain (usual arthralgias -- better since going back to Simvastatin.).  All other systems reviewed and are negative.   Current Outpatient Prescriptions on File Prior to Visit  Medication Sig Dispense Refill  . aspirin 81 MG chewable tablet Chew 1 tablet (81 mg total) by mouth daily.    Marland Kitchen ezetimibe (ZETIA) 10 MG tablet Take 1 tablet (10 mg total) by mouth daily. 30 tablet 6  . metoprolol (LOPRESSOR) 50 MG tablet TAKE 1/2 TABLET BY MOUTH 2 TIMES A DAY 90 tablet 2  . nitroGLYCERIN (NITROLINGUAL) 0.4 MG/SPRAY spray Place 1 spray under the tongue every 5 (five) minutes x 3 doses as needed for chest pain. 12 g 0  . potassium citrate (UROCIT-K) 10  MEQ (1080 MG) SR tablet Take 10 mEq by mouth 2 (two) times daily.  11  . ramipril (ALTACE) 5 MG capsule TAKE ONE CAPSULE EVERY DAY 90 capsule 2  . tamsulosin (FLOMAX) 0.4 MG CAPS capsule Take 1 capsule (0.4 mg total) by mouth daily. 90 capsule 3   No current facility-administered medications on file prior to visit.   Allergies  Allergen Reactions  . Brilinta [Ticagrelor] Shortness Of Breath    Having breathing issues  . Advicor [Niacin-Lovastatin Er] Other (See Comments)    Headache, possibly other reactions  . Lipitor [Atorvastatin] Other (See Comments)    Headache, possibly other reactions  . Pravachol [Pravastatin Sodium] Other (See Comments)    Headache, possibly other reactions  . Simvastatin Other (See Comments)    Leg cramps    History  Substance Use Topics  . Smoking status: Current Every Day Smoker -- 0.50 packs/day for 48 years  Types: Cigarettes  . Smokeless tobacco: Never Used  . Alcohol Use: No   Family History  Problem Relation Age of Onset  . Heart attack Mother     Wt Readings from Last 3 Encounters:  09/10/14 95.346 kg (210 lb 3.2 oz)  08/07/14 97.07 kg (214 lb)  08/01/14 92.7 kg (204 lb 5.9 oz)    PHYSICAL EXAM BP 150/68 mmHg  Pulse 75  Ht 5\' 8"  (1.727 m)  Wt 95.346 kg (210 lb 3.2 oz)  BMI 31.97 kg/m2 General appearance: alert, cooperative, appears stated age, no distress and mildly obese; he has his usual ruddy complexion.  Seems frustrated. HEENT: [place]/AT, EOMI, MMM, anicteric sclera Neck: no adenopathy, no carotid bruit and no JVD Lungs: clear to auscultation bilaterally, normal percussion bilaterally and non-labored Heart: regular rate and rhythm, S1&S2 normal, no murmur, click, rub or gallops; Non-displaced PMI Abdomen: soft, non-tender; bowel sounds normal; no masses,  no organomegaly; No HJR Extremities: extremities normal, atraumatic, no cyanosis, or edema  Pulses: 2+ and symmetric; Skin: normal and no lesions noted or jaundice  noted Neurologic: Mental status: Alert, oriented, thought content appropriate Cranial nerves: normal (II-XII grossly intact)    Adult ECG Report  Rate: 75 ;  Rhythm: normal sinus rhythm and otherwise normal EKG  Other studies Reviewed: Additional studies/ records that were reviewed today include: Cath-PCI, CXR (see above) Review of the above records demonstrates:  Recent Labs:  Lab Results  Component Value Date   CHOL 131 07/31/2014   HDL 32* 07/31/2014   LDLCALC 79 07/31/2014   TRIG 100 07/31/2014   CHOLHDL 4.1 07/31/2014     Chemistry      Component Value Date/Time   NA 137 08/01/2014 0253   K 4.6 08/01/2014 0253   CL 104 08/01/2014 0253   CO2 23 08/01/2014 0253   BUN 14 08/01/2014 0253   CREATININE 1.05 08/01/2014 0253   CREATININE 1.00 07/06/2014 0911      Component Value Date/Time   CALCIUM 9.2 08/01/2014 0253   ALKPHOS 69 07/30/2014 1530   AST 17 07/30/2014 1530   ALT 19 07/30/2014 1530   BILITOT 0.8 07/30/2014 1530     ASSESSMENT / PLAN: Problem List Items Addressed This Visit    CAD in native artery - status post CABG x3 after PCI x2 to the RCA (LIMA-LAD, SVG-diagonal SVG-RI - Primary (Chronic)   Relevant Orders   EKG 12-Lead (Completed)   CAD S/P PCI - DES PCI to prox Cx XIENCE ALPINE RX 3.0X15     Recent DES PCI to Native Cx - On DAPT (converting to Efient from [place]).  On stable dose of BB, statin & ACE-I. -- have room to increase BB & ACE-I.  Current Cath resets clock for f/u Myoview -- recheck in ~4 yrs      Cigarette smoker one half pack a day or less (Chronic)    Counseling given, ~2-3 min.  Not interested in cessation despite his recent ACS episode & COPD noted on CXR      Essential hypertension (Chronic)   Relevant Orders   EKG 12-Lead (Completed)   Hyperlipidemia with target LDL less than 70 (Chronic)    On Simvastatin since PCI.  Notable arthralgias on atorvastatin - tolerating relatively well      Relevant Orders   EKG 12-Lead  (Completed)   Ischemic cardiomyopathy (Chronic)    Mild-moderately reduced EF by echo. No HF Sx.  Without PND/orhtopnea or edema SOB does not seem to be ischemic in  nature.      Relevant Orders   EKG 12-Lead (Completed)   NSTEMI (non-ST elevated myocardial infarction)    Mild NSTEMI/ACS in March - De Novo Cx lesion - Rx with DES. No recurrent anginal pain or HF Sx       Obesity (BMI 30.0-34.9) (Chronic)    The patient understands the need to lose weight with diet and exercise. We have discussed specific strategies for this.      Presence of drug coated stent in left circumflex coronary artery    Convert antiplatelet agent to Efient as SOB is very debilitating         Other Visit Diagnoses    DOE (dyspnea on exertion)        Relevant Orders    EKG 12-Lead (Completed)       Current medicines are reviewed at length with the patient today. (+/- concerns) Brilinta - SOB The following changes have been made: D/C Brilinta - convert to Effient (starting tomorrow) labs/ tests ordered today include:   Orders Placed This Encounter  Procedures  . EKG 12-Lead   Meds ordered this encounter  Medications  . prasugrel (EFFIENT) 10 MG TABS tablet    Sig: Take 1 tablet (10 mg total) by mouth daily.    Dispense:  30 tablet    Refill:  6     Followup: 4-5 months,     Lyrick Worland, Leonie Green, M.D., M.S. Interventional Cardiologist   Pager # [phone]

## 2014-09-10 NOTE — Patient Instructions (Signed)
STOP BRILINTA   START EFFIENT TOMORROW--(CALL TO ACTIVATE SAVINGS CARD)   Your physician wants you to follow-up in 4-5 MONTHS DR [place]. You will receive a reminder letter in the mail two months in advance. If you don't receive a letter, please call our office to schedule the follow-up appointment.

## 2014-09-12 ENCOUNTER — Encounter: Payer: Self-pay | Admitting: Cardiology

## 2014-09-12 NOTE — Assessment & Plan Note (Signed)
Mild-moderately reduced EF by echo. No HF Sx.  Without PND/orhtopnea or edema SOB does not seem to be ischemic in nature.

## 2014-09-12 NOTE — Assessment & Plan Note (Signed)
Counseling given, ~2-3 min.  Not interested in cessation despite his recent ACS episode & COPD noted on CXR

## 2014-09-12 NOTE — Assessment & Plan Note (Signed)
Convert antiplatelet agent to Efient as SOB is very debilitating

## 2014-09-12 NOTE — Assessment & Plan Note (Signed)
Mild NSTEMI/ACS in March - De Novo Cx lesion - Rx with DES. No recurrent anginal pain or HF Sx

## 2014-09-12 NOTE — Assessment & Plan Note (Signed)
Recent DES PCI to Native Cx - On DAPT (converting to Efient from [place]).  On stable dose of BB, statin & ACE-I. -- have room to increase BB & ACE-I.  Current Cath resets clock for f/u Myoview -- recheck in ~4 yrs

## 2014-09-12 NOTE — Assessment & Plan Note (Signed)
The patient understands the need to lose weight with diet and exercise. We have discussed specific strategies for this.  

## 2014-09-12 NOTE — Assessment & Plan Note (Signed)
On Simvastatin since PCI.  Notable arthralgias on atorvastatin - tolerating relatively well

## 2014-09-17 ENCOUNTER — Ambulatory Visit: Payer: BLUE CROSS/BLUE SHIELD | Admitting: Cardiology

## 2014-10-27 ENCOUNTER — Telehealth: Payer: Self-pay | Admitting: *Deleted

## 2014-10-27 DIAGNOSIS — E66811 Obesity, class 1: Secondary | ICD-10-CM

## 2014-10-27 DIAGNOSIS — E669 Obesity, unspecified: Secondary | ICD-10-CM

## 2014-10-27 DIAGNOSIS — E785 Hyperlipidemia, unspecified: Secondary | ICD-10-CM

## 2014-10-27 DIAGNOSIS — Z79899 Other long term (current) drug therapy: Secondary | ICD-10-CM

## 2014-10-27 NOTE — Telephone Encounter (Addendum)
-----   Message from Raiford Simmonds, RN sent at 07/27/2014 10:59 AM EDT ----- Liver and lipid, mail  Slip due sept 2016

## 2014-10-27 NOTE — Telephone Encounter (Deleted)
Mail letter and lab slip-- liver,cmp

## 2014-11-09 ENCOUNTER — Ambulatory Visit: Payer: BLUE CROSS/BLUE SHIELD | Admitting: Cardiology

## 2014-12-14 ENCOUNTER — Ambulatory Visit: Payer: BLUE CROSS/BLUE SHIELD | Admitting: Cardiology

## 2014-12-24 ENCOUNTER — Other Ambulatory Visit: Payer: Self-pay | Admitting: Cardiology

## 2014-12-24 NOTE — Telephone Encounter (Signed)
REFILL 

## 2014-12-27 ENCOUNTER — Other Ambulatory Visit: Payer: Self-pay | Admitting: Cardiology

## 2014-12-28 NOTE — Telephone Encounter (Signed)
Rx request sent to pharmacy.  

## 2015-01-08 ENCOUNTER — Ambulatory Visit: Payer: BLUE CROSS/BLUE SHIELD | Admitting: Primary Care

## 2015-01-23 LAB — HEPATIC FUNCTION PANEL
ALT: 12 U/L (ref 9–46)
AST: 12 U/L (ref 10–35)
Albumin: 3.9 g/dL (ref 3.6–5.1)
Alkaline Phosphatase: 76 U/L (ref 40–115)
BILIRUBIN DIRECT: 0.1 mg/dL (ref <=0.2)
Indirect Bilirubin: 0.3 mg/dL (ref 0.2–1.2)
TOTAL PROTEIN: 6.6 g/dL (ref 6.1–8.1)
Total Bilirubin: 0.4 mg/dL (ref 0.2–1.2)

## 2015-01-23 LAB — LIPID PANEL
Cholesterol: 175 mg/dL (ref 125–200)
HDL: 40 mg/dL (ref >=40)
LDL CALC: 106 mg/dL (ref <130)
TRIGLYCERIDES: 147 mg/dL (ref <150)
Total CHOL/HDL Ratio: 4.4 Ratio (ref <=5.0)
VLDL: 29 mg/dL (ref <30)

## 2015-01-26 ENCOUNTER — Telehealth: Payer: Self-pay | Admitting: *Deleted

## 2015-01-26 NOTE — Telephone Encounter (Signed)
-----   Message from Leonie Man, MD sent at 01/25/2015  5:04 PM EST ----- Liver function test panel looks good.  Unfortunately the cholesterol levels of all gone up. Total cholesterol is up from 131to175 and LDL is up from 79 to 106. Need to confirm what medications he is actually taking. We'll then need to adjust medications based on what he is actually taking.  Leonie Man, MD

## 2015-01-26 NOTE — Telephone Encounter (Signed)
SPOKE TO WIFE  Result given . Verbalized understanding  PATIENT IS ONLY TAKING ZETIA PATIENT HAD ISSUES WITH TAKING OTHER STATINS. WILL DEFER TO DR HARDING. APPOINTMENT SCHEDULE  02/16/15

## 2015-01-27 NOTE — Telephone Encounter (Signed)
Information given to wife. - will discuss at appointment

## 2015-01-27 NOTE — Telephone Encounter (Signed)
OK -  Lets refer to Benjamin Robertson to see if he can meet criteria for PCSK-9 inhibitor  [facility]

## 2015-01-27 NOTE — Telephone Encounter (Signed)
forward to [place]--  For review  Await for further instruction

## 2015-01-27 NOTE — Telephone Encounter (Signed)
Looks like he qualifies medically.  Just need to see what type of prescription coverage he has.  He's under 65, so should be able to get covered for $5/month.  I will talk to him when he comes in on Dec 6 (i will be here that day) and get the information needed to start.

## 2015-01-29 ENCOUNTER — Emergency Department (HOSPITAL_COMMUNITY): Payer: BLUE CROSS/BLUE SHIELD

## 2015-01-29 ENCOUNTER — Encounter (HOSPITAL_COMMUNITY): Payer: Self-pay | Admitting: Emergency Medicine

## 2015-01-29 ENCOUNTER — Inpatient Hospital Stay (HOSPITAL_COMMUNITY)
Admission: EM | Admit: 2015-01-29 | Discharge: 2015-01-30 | DRG: 313 | Disposition: A | Discharge status: Home / Self Care | Payer: BLUE CROSS/BLUE SHIELD | Attending: Cardiovascular Disease | Admitting: Cardiovascular Disease

## 2015-01-29 DIAGNOSIS — Z79899 Other long term (current) drug therapy: Secondary | ICD-10-CM

## 2015-01-29 DIAGNOSIS — F17210 Nicotine dependence, cigarettes, uncomplicated: Secondary | ICD-10-CM | POA: Diagnosis present

## 2015-01-29 DIAGNOSIS — I10 Essential (primary) hypertension: Secondary | ICD-10-CM | POA: Diagnosis not present

## 2015-01-29 DIAGNOSIS — Z888 Allergy status to other drugs, medicaments and biological substances status: Secondary | ICD-10-CM | POA: Diagnosis not present

## 2015-01-29 DIAGNOSIS — R079 Chest pain, unspecified: Secondary | ICD-10-CM | POA: Diagnosis present

## 2015-01-29 DIAGNOSIS — I2510 Atherosclerotic heart disease of native coronary artery without angina pectoris: Secondary | ICD-10-CM | POA: Diagnosis present

## 2015-01-29 DIAGNOSIS — I110 Hypertensive heart disease with heart failure: Secondary | ICD-10-CM | POA: Diagnosis present

## 2015-01-29 DIAGNOSIS — E669 Obesity, unspecified: Secondary | ICD-10-CM | POA: Diagnosis present

## 2015-01-29 DIAGNOSIS — Z6830 Body mass index (BMI) 30.0-30.9, adult: Secondary | ICD-10-CM

## 2015-01-29 DIAGNOSIS — Z7982 Long term (current) use of aspirin: Secondary | ICD-10-CM

## 2015-01-29 DIAGNOSIS — Z951 Presence of aortocoronary bypass graft: Secondary | ICD-10-CM | POA: Diagnosis not present

## 2015-01-29 DIAGNOSIS — E785 Hyperlipidemia, unspecified: Secondary | ICD-10-CM | POA: Diagnosis present

## 2015-01-29 DIAGNOSIS — R072 Precordial pain: Secondary | ICD-10-CM | POA: Diagnosis not present

## 2015-01-29 DIAGNOSIS — I5022 Chronic systolic (congestive) heart failure: Secondary | ICD-10-CM | POA: Diagnosis present

## 2015-01-29 DIAGNOSIS — R0789 Other chest pain: Secondary | ICD-10-CM | POA: Diagnosis present

## 2015-01-29 DIAGNOSIS — I252 Old myocardial infarction: Secondary | ICD-10-CM | POA: Diagnosis not present

## 2015-01-29 DIAGNOSIS — I200 Unstable angina: Secondary | ICD-10-CM

## 2015-01-29 DIAGNOSIS — Z716 Tobacco abuse counseling: Secondary | ICD-10-CM

## 2015-01-29 LAB — CBC
HCT: 50.1 % (ref 39.0–52.0)
HEMOGLOBIN: 17.6 g/dL — AB (ref 13.0–17.0)
MCH: 32.4 pg (ref 26.0–34.0)
MCHC: 35.1 g/dL (ref 30.0–36.0)
MCV: 92.1 fL (ref 78.0–100.0)
PLATELETS: 182 10*3/uL (ref 150–400)
RBC: 5.44 MIL/uL (ref 4.22–5.81)
RDW: 13.4 % (ref 11.5–15.5)
WBC: 9.3 10*3/uL (ref 4.0–10.5)

## 2015-01-29 LAB — BASIC METABOLIC PANEL
ANION GAP: 7 (ref 5–15)
BUN: 19 mg/dL (ref 6–20)
CHLORIDE: 105 mmol/L (ref 101–111)
CO2: 27 mmol/L (ref 22–32)
CREATININE: 1.05 mg/dL (ref 0.61–1.24)
Calcium: 9.5 mg/dL (ref 8.9–10.3)
GFR calc Af Amer: >60 mL/min (ref >60)
GFR calc non Af Amer: >60 mL/min (ref >60)
Glucose, Bld: 137 mg/dL — ABNORMAL HIGH (ref 65–99)
POTASSIUM: 4.1 mmol/L (ref 3.5–5.1)
SODIUM: 139 mmol/L (ref 135–145)

## 2015-01-29 LAB — PROTIME-INR
INR: 0.92 (ref 0.00–1.49)
PROTHROMBIN TIME: 12.6 s (ref 11.6–15.2)

## 2015-01-29 LAB — I-STAT TROPONIN, ED: Troponin i, poc: 0.01 ng/mL (ref 0.00–0.08)

## 2015-01-29 MED ORDER — METOPROLOL TARTRATE 25 MG PO TABS
25.0000 mg | ORAL_TABLET | Freq: Two times a day (BID) | ORAL | Status: DC
  Administered 2015-01-30: 25 mg via ORAL
  Filled 2015-01-29: qty 1

## 2015-01-29 MED ORDER — ONDANSETRON HCL 4 MG/2ML IJ SOLN: 4.0000 mg | Freq: Four times a day (QID) | INTRAMUSCULAR | Status: DC | PRN

## 2015-01-29 MED ORDER — ASPIRIN EC 325 MG PO TBEC
325.0000 mg | DELAYED_RELEASE_TABLET | Freq: Once | ORAL | Status: AC
  Administered 2015-01-29: 325 mg via ORAL
  Filled 2015-01-29: qty 1

## 2015-01-29 MED ORDER — ACETAMINOPHEN 325 MG PO TABS: 650.0000 mg | ORAL_TABLET | ORAL | Status: DC | PRN

## 2015-01-29 MED ORDER — ENOXAPARIN SODIUM 40 MG/0.4ML ~~LOC~~ SOLN: 40.0000 mg | SUBCUTANEOUS | Status: DC

## 2015-01-29 MED ORDER — TAMSULOSIN HCL 0.4 MG PO CAPS
0.4000 mg | ORAL_CAPSULE | Freq: Every day | ORAL | Status: DC
  Filled 2015-01-29: qty 1

## 2015-01-29 MED ORDER — EZETIMIBE 10 MG PO TABS: 10.0000 mg | ORAL_TABLET | Freq: Every day | ORAL | Status: DC

## 2015-01-29 MED ORDER — ASPIRIN EC 81 MG PO TBEC: 81.0000 mg | DELAYED_RELEASE_TABLET | Freq: Every day | ORAL | Status: DC

## 2015-01-29 MED ORDER — RAMIPRIL 5 MG PO CAPS
5.0000 mg | ORAL_CAPSULE | Freq: Every day | ORAL | Status: DC
  Administered 2015-01-30: 5 mg via ORAL
  Filled 2015-01-29: qty 1

## 2015-01-29 MED ORDER — NITROGLYCERIN 0.4 MG SL SUBL: 0.4000 mg | SUBLINGUAL_TABLET | SUBLINGUAL | Status: DC | PRN

## 2015-01-29 MED ORDER — ASPIRIN 81 MG PO CHEW
81.0000 mg | CHEWABLE_TABLET | Freq: Every day | ORAL | Status: DC
  Administered 2015-01-30: 81 mg via ORAL
  Filled 2015-01-29: qty 1

## 2015-01-29 MED ORDER — PRASUGREL HCL 10 MG PO TABS
10.0000 mg | ORAL_TABLET | Freq: Every day | ORAL | Status: DC
  Administered 2015-01-30: 10 mg via ORAL
  Filled 2015-01-29: qty 1

## 2015-01-29 NOTE — H&P (Addendum)
Patient ID: EOIN ANDRADA MRN: [id number] DOB/AGE: 1950-07-24 64 y.o.  Admit date: 01/29/2015 Primary Physician   No PCP Per Patient Primary Cardiologist   Dr. Glenetta Hew Chief Complaint    Chest pain   HPI: Mr. Files is a 64 year old man with CAD status post CABG in 2001, multiple MIs and PCI's most recently with a drug-eluting stent in the left circumflex artery 07/2014, hypertension, hyperlipidemia, ongoing tobacco abuse, and obesity who presents with unstable angina.  His chest pain began yesterday while lifting heavy equipment at work. He reported a 10 minute episode of 7-8 out of 10 substernal chest pain.  The pain was sharp in nature and radiated to his left arm. There is no associated shortness of breath, nausea, vomiting, diaphoresis, lightheadedness, dizziness or palpitations. Today at 7:30 PM he had a recurrent episode of chest pain. This occurred after he had returned home from work. He used sublingual nitroglycerin spray with alleviation of his symptoms by the time he arrived at the emergency department. He has not had any chest pain since his heart catheterization in May, so he decided he should come to the ED for evaluation.   In the ED he was hemodynamically stable. His EKG was unremarkable and his first troponin was negative. He was admitted to cardiology for further evaluation.  Mr. Bielby continues to smoke up to a pack of cigarettes daily. He has no interest in trying to quit.    Review of Systems:     Cardiac Review of Systems: {Y] = yes [ ]  = no  Chest Pain [ x   ]  Resting SOB [   ] Exertional SOB  [  ]  Orthopnea [  ]   Pedal Edema [   ]    Palpitations [  ] Syncope  [  ]   Presyncope [   ]  General Review of Systems: [Y] = yes [  ]=no Constitional: recent weight change [  ]; anorexia [  ]; fatigue [  ]; nausea [  ]; night sweats [  ]; fever [  ]; or chills [  ];                                                                                                                               Eye : blurred vision [  ]; diplopia [   ]; vision changes [  ];  Amaurosis fugax[  ]; Resp: cough [  ];  wheezing[  ];  hemoptysis[  ]; shortness of breath[  ]; paroxysmal nocturnal dyspnea[  ]; dyspnea on exertion[  ]; or orthopnea[  ];  GI:  gallstones[  ], vomiting[  ];  dysphagia[  ]; melena[  ];  hematochezia [  ]; heartburn[  ];   Hx of  Colonoscopy[  ]; GU: kidney stones [  ]; hematuria[  ];   dysuria [  ];  nocturia[  ];  history of  obstruction [  ];                 Skin: rash, swelling[  ];, hair loss[  ];  peripheral edema[  ];  or itching[  ]; Musculosketetal: myalgias[  ];  joint swelling[  ];  joint erythema[  ];  joint pain[  ];  back pain[  ];  Heme/Lymph: bruising[  ];  bleeding[  ];  anemia[  ];  Neuro: TIA[  ];  headaches[  ];  stroke[  ];  vertigo[  ];  seizures[  ];   paresthesias[  ];  difficulty walking[  ];  Psych:depression[  ]; anxiety[  ];  Endocrine: diabetes[  ];  thyroid dysfunction[  ];  Immunizations: Flu [  ]; Pneumococcal[  ];  Other:  Past Medical History  Diagnosis Date  . CAD in native artery 1994, 2001    a) 2001 Exertional Angina --> Myoview: Inf infarct and Ant ischemia --> Cath: 95% pLAD, 99% D1, 80% RI, but Cx-OM1&2 OK , & patent RCA stent --> CABG: b) Cath in 2003 patent grafts, RCA & Cx; c) 08/2009 Myoview: Mod Basal-Mid inferoseptal, Basal-apical inferior Infarct. No ischemia.   . S/P CABG x 3 2001    LIMA-LAD, SVG-diagonal, SVG-Ramus Intermedius (RI) - patent by Cath in 2003  . Ischemic cardiomyopathy 1994, 2003    Echo February 2014: EF 40-45%; moderate HK of anterolateral myocardium.; Grade 1 diastolic dysfunction  . Hyperlipidemia LDL goal <70     At goal by Most recent labs October 2013: TC 123, TG 83, HDL 81, LDL 65  . Obesity (BMI 30.0-34.9)   . History of ST elevation myocardial infarction (STEMI) of inferior wall 1994; 1997    PCI of RCA - redo PCI BMS ML Vision 3.0 mm x 25 mm to RCA in 1997  . Childhood asthma     . Pneumonia 2014  . Arthritis     "qwhere" (07/30/2014)  . Kidney stones   . Hypertension      (Not in a hospital admission)   Allergies  Allergen Reactions  . Brilinta [Ticagrelor] Shortness Of Breath    Having breathing issues  . Advicor [Niacin-Lovastatin Er] Other (See Comments)    Headache, possibly other reactions  . Lipitor [Atorvastatin] Other (See Comments)    Headache, possibly other reactions  . Pravachol [Pravastatin Sodium] Other (See Comments)    Headache, possibly other reactions  . Simvastatin Other (See Comments)    Leg cramps    Social History   Social History  . Marital Status: Married    Spouse Name: N/A  . Number of Children: N/A  . Years of Education: N/A   Occupational History  . Not on file.   Social History Main Topics  . Smoking status: Current Every Day Smoker -- 0.50 packs/day for 48 years    Types: Cigarettes  . Smokeless tobacco: Never Used  . Alcohol Use: No  . Drug Use: No  . Sexual Activity: No   Other Topics Concern  . Not on file   Social History Narrative   Married father of 2 with one grandchild. By his wife.   Continues to smoke one half pack a day.   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   Very physically active at work but no routine exercise.    Family History  Problem Relation Age of Onset  . Heart attack Mother  PHYSICAL EXAM: Filed Vitals:   01/29/15 2300  BP: 103/57  Pulse: 61  Temp:   Resp: 17   General:  Well appearing. No respiratory difficulty HEENT: normal Neck: supple. no JVD. Carotids 2+ bilat; no bruits. No lymphadenopathy or thryomegaly appreciated. Cor: PMI nondisplaced. Regular rate & rhythm. No rubs, gallops or murmurs. Lungs: clear Abdomen: soft, nontender, nondistended. No hepatosplenomegaly. No bruits or masses. Good bowel sounds. Extremities: no cyanosis, clubbing, rash, edema Neuro: alert & oriented x 3, cranial  nerves grossly intact. moves all 4 extremities w/o difficulty. Affect pleasant.   Results for orders placed or performed during the hospital encounter of 01/29/15 (from the past 24 hour(s))  Basic metabolic panel     Status: Abnormal   Collection Time: 01/29/15  9:27 PM  Result Value Ref Range   Sodium 139 135 - 145 mmol/L   Potassium 4.1 3.5 - 5.1 mmol/L   Chloride 105 101 - 111 mmol/L   CO2 27 22 - 32 mmol/L   Glucose, Bld 137 (H) 65 - 99 mg/dL   BUN 19 6 - 20 mg/dL   Creatinine, Ser 1.05 0.61 - 1.24 mg/dL   Calcium 9.5 8.9 - 10.3 mg/dL   GFR calc non Af Amer >60 >60 mL/min   GFR calc Af Amer >60 >60 mL/min   Anion gap 7 5 - 15  CBC     Status: Abnormal   Collection Time: 01/29/15  9:27 PM  Result Value Ref Range   WBC 9.3 4.0 - 10.5 K/uL   RBC 5.44 4.22 - 5.81 MIL/uL   Hemoglobin 17.6 (H) 13.0 - 17.0 g/dL   HCT 50.1 39.0 - 52.0 %   MCV 92.1 78.0 - 100.0 fL   MCH 32.4 26.0 - 34.0 pg   MCHC 35.1 30.0 - 36.0 g/dL   RDW 13.4 11.5 - 15.5 %   Platelets 182 150 - 400 K/uL  Protime-INR - (order if Patient is taking Coumadin / Warfarin)     Status: None   Collection Time: 01/29/15  9:27 PM  Result Value Ref Range   Prothrombin Time 12.6 11.6 - 15.2 seconds   INR 0.92 0.00 - 1.49  I-stat troponin, ED (not at [facility])     Status: None   Collection Time: 01/29/15  9:35 PM  Result Value Ref Range   Troponin i, poc 0.01 0.00 - 0.08 ng/mL   Comment 3           Dg Chest 2 View  01/29/2015  CLINICAL DATA:  Chest pain and shortness of breath for 2 days. EXAM: CHEST  2 VIEW COMPARISON:  08/01/2014 FINDINGS: Postsurgical changes from CABG are stable. Cardiomediastinal silhouette is normal. Mediastinal contours appear intact. There is no evidence of focal airspace consolidation, pleural effusion or pneumothorax. Mild prominence of the interstitial markings is noted. Osseous structures are without acute abnormality. Soft tissues are grossly normal. IMPRESSION: Mild prominence of the  interstitial markings which may be seen with pulmonary vascular congestion, otherwise no evidence of acute cardiopulmonary process. Electronically Signed   By: Fidela Salisbury M.D.   On: 01/29/2015 22:00     ECG: Sinus rhythm rate 76 bpm. Right axis deviation. Nonspecific ST/T changes.  Cath 07/2014:  Severe native vessel coronary disease involving the LAD, ramus and diagonal. The LIMA to LAD, SVG to ramus and SVG to diagonal are all widely patent.  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 This was the culprit for his non-STEMI.  This was successfully treated with a 3.0 x 15 Xience drug-eluting stent, postdilated to 3.6 mm in diameter.  Echo 04/2012: Study Conclusions  - Left ventricle: The cavity size was moderately dilated. Systolic function was mildly to moderately reduced. The estimated ejection fraction was in the range of 40% to 45%. Moderate hypokinesis of the anterolateral myocardium. Doppler parameters are consistent with abnormal left ventricular relaxation (grade 1 diastolic dysfunction). - Atrial septum: No defect or patent foramen ovale was identified.   ASSESSMENT/PLAN:  # Unstable angina/ CAD s/p PCI and CABG: Symptoms are concerning for unstable angina. His first set of cardiac enzymes is negative. We will continue to cycle cardiac enzymes and if positive pursue cardiac catheterization. If they remain negative we will for him for nuclear stress testing. Continue aspirin and Prasugrel.  We will start heparin if his cardiac enzymes turned positive. Continue metoprolol and ramipril.  # Hypertension: BP well-controlled.  Continue home ramipril and metoprolol.  # Hyperlipidemia: Continue Zetia. He has been intolerant of statins in the past. Dr. Ellyn Hack is working to get him approved for a PCS K-9 inhibitor.   # Tobacco abuse: Mr. Tambasco reports that he has no interest in quitting.  He was resistant to the idea that cigarettes  are contributing to his heart attacks and extensive CAD.  # Chronic systolic heart failure: EF was 40-45% on echo 04/2012.  He is euvolemic on exam.  Consider echo.  Continue ramipril andmetoprolol.  Signed: Sharol Harness, MD 01/29/2015, 11:04 PM

## 2015-01-29 NOTE — ED Notes (Signed)
Pt. reports central chest pain radiating to left shoulder and left arm onset yesterday , pt. Took NTG spray prior to arrival with slight relief , mild SOB and diaphoresis , denies nausea or emesis . Pt. stated history of CAD/CABG/Coronary stent his cardiologist is Dr. Ellyn Hack .

## 2015-01-29 NOTE — ED Provider Notes (Signed)
CSN: [id number]     Arrival date & time 01/29/15  2052 History   First MD Initiated Contact with Patient 01/29/15 2229     Chief Complaint  Patient presents with  . Chest Pain    Patient is a 64 y.o. male presenting with chest pain. The history is provided by the patient.  Chest Pain Pain location:  Substernal area Pain quality: pressure   Pain radiates to:  L shoulder and L arm Pain radiates to the back: no   Pain severity:  Moderate Onset quality:  Sudden Duration:  10 minutes Timing:  Constant Progression:  Resolved Chronicity:  New Context comment:  Woke from sleep Relieved by:  Nitroglycerin Associated symptoms: no abdominal pain, no back pain, no claudication, no cough, no diaphoresis, no fever, no headache, no nausea, no palpitations, no shortness of breath, no syncope and not vomiting   Risk factors: coronary artery disease, hypertension, male sex and smoking     Past Medical History  Diagnosis Date  . CAD in native artery 1994, 2001    a) 2001 Exertional Angina --> Myoview: Inf infarct and Ant ischemia --> Cath: 95% pLAD, 99% D1, 80% RI, but Cx-OM1&2 OK , & patent RCA stent --> CABG: b) Cath in 2003 patent grafts, RCA & Cx; c) 08/2009 Myoview: Mod Basal-Mid inferoseptal, Basal-apical inferior Infarct. No ischemia.   . S/P CABG x 3 2001    LIMA-LAD, SVG-diagonal, SVG-Ramus Intermedius (RI) - patent by Cath in 2003  . Ischemic cardiomyopathy 1994, 2003    Echo February 2014: EF 40-45%; moderate HK of anterolateral myocardium.; Grade 1 diastolic dysfunction  . Hyperlipidemia LDL goal <70     At goal by Most recent labs October 2013: TC 123, TG 83, HDL 81, LDL 65  . Obesity (BMI 30.0-34.9)   . History of ST elevation myocardial infarction (STEMI) of inferior wall 1994; 1997    PCI of RCA - redo PCI BMS ML Vision 3.0 mm x 25 mm to RCA in 1997  . Childhood asthma   . Pneumonia 2014  . Arthritis     "qwhere" (07/30/2014)  . Kidney stones   . Hypertension    Past Surgical  History  Procedure Laterality Date  . Coronary artery bypass graft  06/07/1999    x3. LIMA to distal LAD, SVG to first diag, and SVG to ramus  . Nm myoview ltd  08/19/2009    Moderafte perfusion seen in Basal inferoseptal, Basal inferior, Mid inferoseptal, Mid inferiorm, and Apical inferior regions. No ECG changes. EKG negative for ischemia.  . Transthoracic echocardiogram  05/06/2012    EF 40-45%, LV cavity moderately dilated, systolic function mild-moderately reduced, moderate hypokinesis of anterolatewral myocardium.  . Carotid doppler  05/06/2012    Less than 50% diameter reduction of the Lft Subclavian.  . Lithotripsy  "2-3 times"  . Coronary angioplasty with stent placement  01/21/1996    PTCA of RCA-95% lesion  . Cardiac catheterization  06/06/1999    Recommend CABG  . Cardiac catheterization  10/04/2001    Patent grafts with a 50% lesion in LAD beyond IMA insertion  . Cardiac catheterization N/A 07/31/2014    Procedure: Left Heart Cath and Cors/Grafts Angiography;  Surgeon: Jettie Booze, MD;  Location: [place] [facility];  Service: Cardiovascular;  Laterality: N/A;  . Cardiac catheterization N/A 07/31/2014    Procedure: Coronary Stent Intervention;  Surgeon: Jettie Booze, MD;  Location: [place] [facility];  Service: Cardiovascular;  Laterality: N/A;  Family History  Problem Relation Age of Onset  . Heart attack Mother    Social History  Substance Use Topics  . Smoking status: Current Every Day Smoker -- 0.50 packs/day for 48 years    Types: Cigarettes  . Smokeless tobacco: Never Used  . Alcohol Use: No    Review of Systems  Constitutional: Negative for fever, chills and diaphoresis.  HENT: Negative for rhinorrhea and sore throat.   Eyes: Negative for visual disturbance.  Respiratory: Negative for cough and shortness of breath.   Cardiovascular: Positive for chest pain. Negative for palpitations, claudication and syncope.  Gastrointestinal: Negative for nausea,  vomiting, abdominal pain, diarrhea and constipation.  Genitourinary: Negative for dysuria and hematuria.  Musculoskeletal: Negative for back pain and neck pain.  Skin: Negative for rash.  Neurological: Negative for syncope and headaches.  Psychiatric/Behavioral: Negative for confusion.  All other systems reviewed and are negative.     Allergies  Brilinta; Advicor; Lipitor; Pravachol; and Simvastatin  Home Medications   Prior to Admission medications   Medication Sig Start Date End Date Taking? Authorizing Provider  aspirin 81 MG chewable tablet Chew 1 tablet (81 mg total) by mouth daily. 08/01/14  Yes Luke K Kilroy, PA-C  ezetimibe (ZETIA) 10 MG tablet Take 1 tablet (10 mg total) by mouth daily. Patient taking differently: Take 10 mg by mouth at bedtime.  08/11/14  Yes Leonie Man, MD  metoprolol (LOPRESSOR) 50 MG tablet TAKE 1/2 TABLET BY MOUTH 2 TIMES A DAY 12/28/14  Yes Leonie Man, MD  nitroGLYCERIN (NITROLINGUAL) 0.4 MG/SPRAY spray Place 1 spray under the tongue every 5 (five) minutes x 3 doses as needed for chest pain. 09/08/14  Yes Leonie Man, MD  potassium citrate (UROCIT-K) 10 MEQ (1080 MG) SR tablet Take 10 mEq by mouth 2 (two) times daily. 06/08/14  Yes Historical Provider, MD  prasugrel (EFFIENT) 10 MG TABS tablet Take 1 tablet (10 mg total) by mouth daily. 09/10/14  Yes Leonie Man, MD  ramipril (ALTACE) 5 MG capsule TAKE ONE CAPSULE EVERY DAY Patient taking differently: TAKE 5 MG BY MOUTH EVERY DAY 12/24/14  Yes Leonie Man, MD  tamsulosin (FLOMAX) 0.4 MG CAPS capsule Take 1 capsule (0.4 mg total) by mouth daily. 03/14/13  Yes Leonie Man, MD   BP 151/93 mmHg  Pulse 81  Temp(Src) 98 F (36.7 C) (Oral)  Resp 16  Ht 5\' 8"  (1.727 m)  Wt 210 lb (95.255 kg)  BMI 31.94 kg/m2  SpO2 97% Physical Exam  Constitutional: He is oriented to person, place, and time. He appears well-developed and well-nourished. No distress.  HENT:  Head: Normocephalic and  atraumatic.  Mouth/Throat: Oropharynx is clear and moist.  Eyes: EOM are normal.  Neck: Neck supple. No JVD present.  Cardiovascular: Normal rate, regular rhythm, normal heart sounds and intact distal pulses.   Pulmonary/Chest: Effort normal and breath sounds normal.  Sternotomy scar healed  Abdominal: Soft. He exhibits no distension. There is no tenderness.  Musculoskeletal: Normal range of motion. He exhibits no edema.  Neurological: He is alert and oriented to person, place, and time. No cranial nerve deficit.  Skin: Skin is warm and dry.  Psychiatric: His behavior is normal.    ED Course  Procedures  None   Labs Review Labs Reviewed  BASIC METABOLIC PANEL - Abnormal; Notable for the following:    Glucose, Bld 137 (*)    All other components within normal limits  CBC - Abnormal; Notable  for the following:    Hemoglobin 17.6 (*)    All other components within normal limits  PROTIME-INR  Randolm Idol, ED    Imaging Review Dg Chest 2 View  01/29/2015  CLINICAL DATA:  Chest pain and shortness of breath for 2 days. EXAM: CHEST  2 VIEW COMPARISON:  08/01/2014 FINDINGS: Postsurgical changes from CABG are stable. Cardiomediastinal silhouette is normal. Mediastinal contours appear intact. There is no evidence of focal airspace consolidation, pleural effusion or pneumothorax. Mild prominence of the interstitial markings is noted. Osseous structures are without acute abnormality. Soft tissues are grossly normal. IMPRESSION: Mild prominence of the interstitial markings which may be seen with pulmonary vascular congestion, otherwise no evidence of acute cardiopulmonary process. Electronically Signed   By: Fidela Salisbury M.D.   On: 01/29/2015 22:00   I have personally reviewed and evaluated these images and lab results as part of my medical decision-making.   EKG Interpretation   Date/Time:  Friday January 29 2015 20:56:06 EST Ventricular Rate:  76 PR Interval:  166 QRS  Duration: 90 QT Interval:  370 QTC Calculation: 416 R Axis:   96 Text Interpretation:  Normal sinus rhythm Rightward axis Nonspecific ST  abnormality Abnormal ECG changes noted compared to May 2016 Confirmed by  Regenia Skeeter  MD, [place] [phone]) on 01/29/2015 10:53:56 PM      MDM   Final diagnoses:  Unstable angina ([place])  H/O non-ST elevation myocardial infarction (NSTEMI)  S/P CABG (coronary artery bypass graft)   64 yo M with a PMH of CAD s/p 3v CABG in 2001 and NSTEMI in May 2016 s/p DES presents with chest pressure that woke him from sleep around 9pm. Lasted for approximately 10 minutes. Improved with NTG spray. Radiated to left arm and hand. Pain free now. Denies SOB, lightheadedness, nausea or diaphoresis. Patient is on effient for stent. Continues to smoke and not interested in quitting. Appears well on exam. Sternotomy scar noted. Heart and lung exam unremarkable. No peripheral edema. CXR interpreted by me appears unremarkable. EKG with nonspected ST changes but no acute ischemic abnormalities. Troponin 0.01. Cardiology consulted for admission for unstable angina. Anticipate admission. ASA 325 mg given.  Cardiology saw patient in ED and will admit for ACS workup. Stable for the floor.  Discussed with Dr. Regenia Skeeter.  Gustavus Bryant, MD 01/29/15 [id number]  Sherwood Gambler, MD 02/03/15 [id number]

## 2015-01-30 ENCOUNTER — Other Ambulatory Visit: Payer: Self-pay | Admitting: Physician Assistant

## 2015-01-30 ENCOUNTER — Encounter (HOSPITAL_COMMUNITY): Payer: Self-pay

## 2015-01-30 DIAGNOSIS — R072 Precordial pain: Secondary | ICD-10-CM

## 2015-01-30 DIAGNOSIS — I200 Unstable angina: Secondary | ICD-10-CM

## 2015-01-30 LAB — LIPID PANEL
CHOL/HDL RATIO: 5.2 ratio
CHOLESTEROL: 172 mg/dL (ref 0–200)
HDL: 33 mg/dL — ABNORMAL LOW (ref >40)
LDL CALC: 117 mg/dL — AB (ref 0–99)
TRIGLYCERIDES: 110 mg/dL (ref <150)
VLDL: 22 mg/dL (ref 0–40)

## 2015-01-30 LAB — BASIC METABOLIC PANEL
ANION GAP: 8 (ref 5–15)
BUN: 17 mg/dL (ref 6–20)
CALCIUM: 9 mg/dL (ref 8.9–10.3)
CHLORIDE: 107 mmol/L (ref 101–111)
CO2: 23 mmol/L (ref 22–32)
Creatinine, Ser: 0.98 mg/dL (ref 0.61–1.24)
GFR calc non Af Amer: >60 mL/min (ref >60)
GLUCOSE: 96 mg/dL (ref 65–99)
Potassium: 4.1 mmol/L (ref 3.5–5.1)
Sodium: 138 mmol/L (ref 135–145)

## 2015-01-30 LAB — CBC
HEMATOCRIT: 47.2 % (ref 39.0–52.0)
HEMOGLOBIN: 15.8 g/dL (ref 13.0–17.0)
MCH: 31.4 pg (ref 26.0–34.0)
MCHC: 33.5 g/dL (ref 30.0–36.0)
MCV: 93.8 fL (ref 78.0–100.0)
Platelets: 169 10*3/uL (ref 150–400)
RBC: 5.03 MIL/uL (ref 4.22–5.81)
RDW: 13.4 % (ref 11.5–15.5)
WBC: 8 10*3/uL (ref 4.0–10.5)

## 2015-01-30 LAB — TROPONIN I
TROPONIN I: 0.04 ng/mL — AB (ref <0.031)
TROPONIN I: 0.03 ng/mL (ref <0.031)

## 2015-01-30 MED ORDER — PNEUMOCOCCAL VAC POLYVALENT 25 MCG/0.5ML IJ INJ
0.5000 mL | INJECTION | Freq: Once | INTRAMUSCULAR | Status: DC
  Filled 2015-01-30: qty 0.5

## 2015-01-30 MED ORDER — PRASUGREL HCL 10 MG PO TABS: 10.0000 mg | ORAL_TABLET | Freq: Every day | ORAL | Status: DC

## 2015-01-30 MED ORDER — PNEUMOCOCCAL VAC POLYVALENT 25 MCG/0.5ML IJ INJ: 0.5000 mL | INJECTION | INTRAMUSCULAR | Status: DC

## 2015-01-30 NOTE — Discharge Instructions (Signed)
Please quit tobacco.

## 2015-01-30 NOTE — Progress Notes (Signed)
Patient received from ED. Presently not in any pain or distress.  Patient oriented to room, call light within reach. Wife at bedside.

## 2015-01-30 NOTE — Progress Notes (Signed)
    Subjective:  Denies CP or dyspnea   Objective:  Filed Vitals:   01/29/15 2348 01/30/15 0000 01/30/15 0100 01/30/15 0405  BP:  125/63 141/70 114/53  Pulse:  62 64 64  Temp:   98.4 F (36.9 C) 98.1 F (36.7 C)  TempSrc:   Oral Oral  Resp:  21 20 18   Height:   5\' 8"  (1.727 m)   Weight:   95.936 kg (211 lb 8 oz)   SpO2: 94% 92% 94% 94%    Intake/Output from previous day: No intake or output data in the 24 hours ending 01/30/15 0845  Physical Exam: Physical exam: Well-developed well-nourished in no acute distress.  Skin is warm and dry.  HEENT is normal.  Neck is supple.  Chest is clear to auscultation with normal expansion.  Cardiovascular exam is regular rate and rhythm.  Abdominal exam nontender or distended. No masses palpated. Extremities show no edema. neuro grossly intact    Lab Results: Basic Metabolic Panel:  Recent Labs  01/29/15 2127 01/30/15 0221  NA 139 138  K 4.1 4.1  CL 105 107  CO2 27 23  GLUCOSE 137* 96  BUN 19 17  CREATININE 1.05 0.98  CALCIUM 9.5 9.0   CBC:  Recent Labs  01/29/15 2127 01/30/15 0221  WBC 9.3 8.0  HGB 17.6* 15.8  HCT 50.1 47.2  MCV 92.1 93.8  PLT 182 169   Cardiac Enzymes:  Recent Labs  01/30/15 0221 01/30/15 0727  TROPONINI 0.04* 0.03     Assessment/Plan:  1 chest pain-symptoms are atypical. Patient states they are unlike his previous infarct pain. Troponin at 2:21 AM 0.04 but follow-up negative. We discussed inpatient stress test but patient declined and would prefer outpatient study. We will arrange this early next week and then follow-up with Dr. Ellyn Hack. 2 coronary artery disease-continue aspirin, prasugrel and metoprolol. Continue zetia; intolerant to statins. 3 hypertension-blood pressure is controlled. Continue preadmission medications. 4 tobacco abuse-patient needs to discontinue. >30 min PA and physician time D2  Kirk Ruths 01/30/2015, 8:45 AM

## 2015-01-30 NOTE — Progress Notes (Signed)
Discussed with the patient and his wife, all questioned fully answered. He will call me if any problems arise. Discharge instructions included a followup appointment for a stress test, medications, and when to call the MD.   Fritz Pickerel, RN

## 2015-01-30 NOTE — Discharge Summary (Signed)
CARDIOLOGY DISCHARGE SUMMARY   Patient ID: Benjamin Robertson MRN: [id number] DOB/AGE: 10-27-1950 64 y.o.  Admit date: 01/29/2015 Discharge date: 01/30/2015  PCP: No PCP Per Patient Primary Cardiologist: Dr. Ellyn Hack  Primary Discharge Diagnosis:  Chest pain, moderate risk of coronary etiology Secondary Discharge Diagnosis:  Hypertension Hyperlipidemia Chronic systolic CHF \ Procedures: CXR  Hospital Course: ATTIKUS ROSENDALE is a 64 y.o. male with a history of CABG 2001, last stent May 2016, hypertension, hyperlipidemia, ongoing tobacco abuse and obesity. He came to the hospital on 11/18 with chest pain concerning for angina and was admitted for further evaluation and treatment.  His chest pain resolved. He had a minimal elevation in his initial troponin, but subsequent enzymes were negative. He was offered an inpatient stress test but declined.  A lipid profile was performed, results below. He is on Zetia because of statin intolerance and is to continue this. Dietary compliance with a low cholesterol diet is encouraged.  On 11/19, he was seen by Dr. Stanford Breed and all data were reviewed. He is to continue current therapy for CAD with aspirin, Effient, beta blocker and Zetia. He is intolerant to statins. Smoking cessation was discussed but the patient has no interest in quitting.  No further inpatient workup was indicated and he is considered stable for discharge, to get an outpatient Myoview and then follow up with Dr. Ellyn Hack.  Labs:   Lab Results  Component Value Date   WBC 8.0 01/30/2015   HGB 15.8 01/30/2015   HCT 47.2 01/30/2015   MCV 93.8 01/30/2015   PLT 169 01/30/2015     Recent Labs Lab 01/30/15 0221  NA 138  K 4.1  CL 107  CO2 23  BUN 17  CREATININE 0.98  CALCIUM 9.0  GLUCOSE 96    Recent Labs  01/30/15 0221 01/30/15 0727  TROPONINI 0.04* 0.03   Lipid Panel     Component Value Date/Time   CHOL 172 01/30/2015 0727   TRIG 110 01/30/2015 0727   HDL  33* 01/30/2015 0727   CHOLHDL 5.2 01/30/2015 0727   VLDL 22 01/30/2015 0727   LDLCALC 117* 01/30/2015 0727    Recent Labs  01/29/15 2127  INR 0.92      Radiology: Dg Chest 2 View 01/29/2015  CLINICAL DATA:  Chest pain and shortness of breath for 2 days. EXAM: CHEST  2 VIEW COMPARISON:  08/01/2014 FINDINGS: Postsurgical changes from CABG are stable. Cardiomediastinal silhouette is normal. Mediastinal contours appear intact. There is no evidence of focal airspace consolidation, pleural effusion or pneumothorax. Mild prominence of the interstitial markings is noted. Osseous structures are without acute abnormality. Soft tissues are grossly normal. IMPRESSION: Mild prominence of the interstitial markings which may be seen with pulmonary vascular congestion, otherwise no evidence of acute cardiopulmonary process. Electronically Signed   By: Fidela Salisbury M.D.   On: 01/29/2015 22:00   EKG: Sinus rhythm, no acute ischemic changes  FOLLOW UP PLANS AND APPOINTMENTS Allergies  Allergen Reactions  . Brilinta [Ticagrelor] Shortness Of Breath    Having breathing issues  . Advicor [Niacin-Lovastatin Er] Other (See Comments)    Headache, possibly other reactions  . Lipitor [Atorvastatin] Other (See Comments)    Headache, possibly other reactions  . Pravachol [Pravastatin Sodium] Other (See Comments)    Headache, possibly other reactions  . Simvastatin Other (See Comments)    Leg cramps     Medication List    TAKE these medications  aspirin 81 MG chewable tablet  Chew 1 tablet (81 mg total) by mouth daily.     ezetimibe 10 MG tablet  Commonly known as:  ZETIA  Take 1 tablet (10 mg total) by mouth daily.     metoprolol 50 MG tablet  Commonly known as:  LOPRESSOR  TAKE 1/2 TABLET BY MOUTH 2 TIMES A DAY     nitroGLYCERIN 0.4 MG/SPRAY spray  Commonly known as:  NITROLINGUAL  Place 1 spray under the tongue every 5 (five) minutes x 3 doses as needed for chest pain.      potassium citrate 10 MEQ (1080 MG) SR tablet  Commonly known as:  UROCIT-K  Take 10 mEq by mouth 2 (two) times daily.     prasugrel 10 MG Tabs tablet  Commonly known as:  EFFIENT  Take 1 tablet (10 mg total) by mouth daily.     ramipril 5 MG capsule  Commonly known as:  ALTACE  TAKE ONE CAPSULE EVERY DAY     tamsulosin 0.4 MG Caps capsule  Commonly known as:  FLOMAX  Take 1 capsule (0.4 mg total) by mouth daily.        Discharge Instructions    Diet - low sodium heart healthy    Complete by:  As directed      Increase activity slowly    Complete by:  As directed           Follow-up Information    Follow up with Leonie Man, MD.   Specialty:  Cardiology   Why:  Stress test and followup appointment, The office will call.   Contact information:   [place] [address] [phone]       BRING ALL MEDICATIONS WITH YOU TO FOLLOW UP APPOINTMENTS  Time spent with patient to include physician time: 36 min Signed: Rosaria Ferries, PA-C 01/30/2015, 11:52 AM Co-Sign MD

## 2015-02-09 ENCOUNTER — Telehealth (HOSPITAL_COMMUNITY): Payer: Self-pay

## 2015-02-09 NOTE — Telephone Encounter (Signed)
Encounter complete. 

## 2015-02-11 ENCOUNTER — Ambulatory Visit (HOSPITAL_COMMUNITY)
Admission: RE | Admit: 2015-02-11 | Discharge: 2015-02-11 | Disposition: A | Discharge status: Home / Self Care | Payer: BLUE CROSS/BLUE SHIELD | Source: Ambulatory Visit | Attending: Cardiovascular Disease | Admitting: Cardiovascular Disease

## 2015-02-11 DIAGNOSIS — Z8249 Family history of ischemic heart disease and other diseases of the circulatory system: Secondary | ICD-10-CM | POA: Insufficient documentation

## 2015-02-11 DIAGNOSIS — F17200 Nicotine dependence, unspecified, uncomplicated: Secondary | ICD-10-CM | POA: Insufficient documentation

## 2015-02-11 DIAGNOSIS — I200 Unstable angina: Secondary | ICD-10-CM | POA: Diagnosis not present

## 2015-02-11 DIAGNOSIS — R5383 Other fatigue: Secondary | ICD-10-CM | POA: Diagnosis not present

## 2015-02-11 DIAGNOSIS — R9439 Abnormal result of other cardiovascular function study: Secondary | ICD-10-CM | POA: Diagnosis not present

## 2015-02-11 DIAGNOSIS — R0609 Other forms of dyspnea: Secondary | ICD-10-CM | POA: Diagnosis not present

## 2015-02-11 DIAGNOSIS — Z6832 Body mass index (BMI) 32.0-32.9, adult: Secondary | ICD-10-CM | POA: Diagnosis not present

## 2015-02-11 DIAGNOSIS — E669 Obesity, unspecified: Secondary | ICD-10-CM | POA: Insufficient documentation

## 2015-02-11 DIAGNOSIS — R0602 Shortness of breath: Secondary | ICD-10-CM | POA: Diagnosis not present

## 2015-02-11 DIAGNOSIS — R079 Chest pain, unspecified: Secondary | ICD-10-CM | POA: Insufficient documentation

## 2015-02-11 DIAGNOSIS — I10 Essential (primary) hypertension: Secondary | ICD-10-CM | POA: Diagnosis not present

## 2015-02-11 LAB — MYOCARDIAL PERFUSION IMAGING
CHL CUP NUCLEAR SDS: 1
CHL CUP NUCLEAR SRS: 4
LV sys vol: 52 mL
LVDIAVOL: 110 mL
Peak HR: 75 {beats}/min
Rest HR: 57 {beats}/min
SSS: 5
TID: 1.15

## 2015-02-11 MED ORDER — TECHNETIUM TC 99M SESTAMIBI GENERIC - CARDIOLITE
32.5000 | Freq: Once | INTRAVENOUS | Status: AC | PRN
  Administered 2015-02-11: 32.5 via INTRAVENOUS

## 2015-02-11 MED ORDER — TECHNETIUM TC 99M SESTAMIBI GENERIC - CARDIOLITE
10.9000 | Freq: Once | INTRAVENOUS | Status: AC | PRN
  Administered 2015-02-11: 10.9 via INTRAVENOUS

## 2015-02-11 MED ORDER — REGADENOSON 0.4 MG/5ML IV SOLN
0.4000 mg | Freq: Once | INTRAVENOUS | Status: AC
  Administered 2015-02-11: 0.4 mg via INTRAVENOUS

## 2015-02-16 ENCOUNTER — Ambulatory Visit: Payer: BLUE CROSS/BLUE SHIELD | Admitting: Pharmacist Clinician (PhC)/ Clinical Pharmacy Specialist

## 2015-02-16 ENCOUNTER — Ambulatory Visit (INDEPENDENT_AMBULATORY_CARE_PROVIDER_SITE_OTHER): Payer: BLUE CROSS/BLUE SHIELD | Admitting: Cardiology

## 2015-02-16 VITALS — BP 132/84 | HR 82 | Ht 68.0 in | Wt 213.7 lb

## 2015-02-16 DIAGNOSIS — I2510 Atherosclerotic heart disease of native coronary artery without angina pectoris: Secondary | ICD-10-CM

## 2015-02-16 DIAGNOSIS — F17210 Nicotine dependence, cigarettes, uncomplicated: Secondary | ICD-10-CM

## 2015-02-16 DIAGNOSIS — I255 Ischemic cardiomyopathy: Secondary | ICD-10-CM

## 2015-02-16 DIAGNOSIS — E785 Hyperlipidemia, unspecified: Secondary | ICD-10-CM | POA: Diagnosis not present

## 2015-02-16 DIAGNOSIS — Z9861 Coronary angioplasty status: Secondary | ICD-10-CM

## 2015-02-16 DIAGNOSIS — I10 Essential (primary) hypertension: Secondary | ICD-10-CM | POA: Diagnosis not present

## 2015-02-16 DIAGNOSIS — I214 Non-ST elevation (NSTEMI) myocardial infarction: Secondary | ICD-10-CM

## 2015-02-16 DIAGNOSIS — Z79899 Other long term (current) drug therapy: Secondary | ICD-10-CM

## 2015-02-16 DIAGNOSIS — I2119 ST elevation (STEMI) myocardial infarction involving other coronary artery of inferior wall: Secondary | ICD-10-CM

## 2015-02-16 MED ORDER — SIMVASTATIN 40 MG PO TABS: 40.0000 mg | ORAL_TABLET | Freq: Every day | ORAL | Status: DC

## 2015-02-16 NOTE — Patient Instructions (Signed)
RE-TRY SIMVASTATIN 40 MG  - FOR THE FIRST 2 WEEKS TAKE 1/2 TABLET ( 20 MG) IF NO CRAMPS INCREASE TO 40  MG DAILY. CALL OFFICE WITH YOUR PROGRESS.  IF YOU ABLE TO TOLERATE  SIMVASTATIN HAVE LABS DONE IN MARCH 2017- WILL MAIL YOU A LAB SLIP   Your physician wants you to follow-up in [place]. You will receive a reminder letter in the mail two months in advance. If you don't receive a letter, please call our office to schedule the follow-up appointment.  If you need a refill on your cardiac medications before your next appointment, please call your pharmacy.

## 2015-02-16 NOTE — Progress Notes (Signed)
PCP: No PCP Per Patient  Clinic Note: Chief Complaint  Patient presents with  . Follow-up  . Chest Pain    pt states two weeks ago he had some chest pain but hasn't had any since  . Shortness of Breath    no SOB, no light headednessor dizziness  . Edema    no edema    HPI: Benjamin Robertson is a 64 y.o. male with a PMH below who presents today for post-hospital follow-up .  Benjamin Robertson was last seen here in May following a cardiac cath for unstable angina.  Recent Hospitalizations:   May 2016 - Cath  Conclusion     Severe native vessel coronary disease involving the LAD, ramus and diagonal. The LIMA to LAD, SVG to ramus and SVG to diagonal are all widely patent.  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 This was the culprit for his non-STEMI. This was successfully treated with a 3.0 x 15 Xience drug-eluting stent, postdilated to 3.6 mm in diameter.  Continue dual antiplatelet therapy for at least a year along with aggressive secondary prevention. He will follow-up with Dr. Ellyn Hack.   Nov 18 - Unstable Angina -- declined IP ST --now has OP Myoview  Studies Reviewed:   12/1: Myoview - Low Risk 0- EF 52%. Negative EKG. medium size, severe intensity, fixed inferior defect consistent with prior inferior infarct; no ischemia; Inf Hypokinesis. (no change from 2012)  Interval History: Doing better since hospital stay - happy to hear results of OP stress test.   Has not had another episode of pain like he did in the hospital - occasional L arm pain, but not associated with CP.  Back to full work load. Does not do "exercise" but work is physically demanding.  No further chest pain or shortness of breath with rest or exertion.  No PND, orthopnea or edema.  No palpitations, lightheadedness, dizziness, weakness or syncope/near syncope. No TIA/amaurosis fugax symptoms.   ROS: A comprehensive was performed. Review of Systems  Constitutional:  Negative for malaise/fatigue.  HENT: Negative for nosebleeds.   Respiratory: Positive for cough and shortness of breath (chronic baseline dyspnea & cough).   Cardiovascular: Negative for claudication.  Gastrointestinal: Negative for blood in stool and melena.  Musculoskeletal: Positive for joint pain (knees). Negative for myalgias and falls.  Neurological: Negative for weakness and headaches.  All other systems reviewed and are negative.   Past Medical History  Diagnosis Date  . CAD in native artery 1994, 2001    a) 2001 Exertional Angina --> Myoview: Inf infarct and Ant ischemia --> Cath: 95% pLAD, 99% D1, 80% RI, but Cx-OM1&2 OK , & patent RCA stent --> CABG: b) Cath in 2003 patent grafts, RCA & Cx; c) 08/2009 Myoview: Mod Basal-Mid inferoseptal, Basal-apical inferior Infarct. No ischemia.   . S/P CABG x 3 2001    LIMA-LAD, SVG-diagonal, SVG-Ramus Intermedius (RI) - patent by Cath in 2003  . Ischemic cardiomyopathy 1994, 2003    Echo February 2014: EF 40-45%; moderate HK of anterolateral myocardium.; Grade 1 diastolic dysfunction  . Hyperlipidemia LDL goal <70     At goal by Most recent labs October 2013: TC 123, TG 83, HDL 81, LDL 65  . Obesity (BMI 30.0-34.9)   . History of ST elevation myocardial infarction (STEMI) of inferior wall 1994; 1997    PCI of RCA - redo PCI BMS ML Vision 3.0 mm x 25 mm to RCA in 1997  .  Childhood asthma   . Pneumonia 2014  . Arthritis     "qwhere" (07/30/2014)  . Kidney stones   . Hypertension     Past Surgical History  Procedure Laterality Date  . Coronary artery bypass graft  06/07/1999    x3. LIMA to distal LAD, SVG to first diag, and SVG to ramus  . Nm myoview ltd  08/19/2009    Moderafte perfusion seen in Basal inferoseptal, Basal inferior, Mid inferoseptal, Mid inferiorm, and Apical inferior regions. No ECG changes. EKG negative for ischemia.  . Transthoracic echocardiogram  05/06/2012    EF 40-45%, LV cavity moderately dilated, systolic function  mild-moderately reduced, moderate hypokinesis of anterolatewral myocardium.  . Carotid doppler  05/06/2012    Less than 50% diameter reduction of the Lft Subclavian.  . Lithotripsy  "2-3 times"  . Coronary angioplasty with stent placement  01/21/1996    PTCA of RCA-95% lesion  . Cardiac catheterization  06/06/1999    Recommend CABG  . Cardiac catheterization  10/04/2001    Patent grafts with a 50% lesion in LAD beyond IMA insertion  . Cardiac catheterization N/A 07/31/2014    Procedure: Left Heart Cath and Cors/Grafts Angiography;  Surgeon: Jettie Booze, MD;  Location: [facility];  Service: Cardiovascular;  Laterality: N/A;  . Cardiac catheterization N/A 07/31/2014    Procedure: Coronary Stent Intervention;  Surgeon: Jettie Booze, MD;  Location: [facility];  Service: Cardiovascular;  Laterality: N/A;    Prior to Admission medications   Medication Sig Start Date End Date Taking? Authorizing Provider  aspirin 81 MG chewable tablet Chew 1 tablet (81 mg total) by mouth daily. 08/01/14  Yes Luke K Kilroy, PA-C  ezetimibe (ZETIA) 10 MG tablet Take 1 tablet (10 mg total) by mouth daily. Patient taking differently: Take 10 mg by mouth at bedtime.  08/11/14  Yes Leonie Man, MD  metoprolol (LOPRESSOR) 50 MG tablet TAKE 1/2 TABLET BY MOUTH 2 TIMES A DAY 12/28/14  Yes Leonie Man, MD  nitroGLYCERIN (NITROLINGUAL) 0.4 MG/SPRAY spray Place 1 spray under the tongue every 5 (five) minutes x 3 doses as needed for chest pain. 09/08/14  Yes Leonie Man, MD  potassium citrate (UROCIT-K) 10 MEQ (1080 MG) SR tablet Take 10 mEq by mouth 2 (two) times daily. 06/08/14  Yes Historical Provider, MD  prasugrel (EFFIENT) 10 MG TABS tablet Take 1 tablet (10 mg total) by mouth daily. 01/30/15  Yes Rhonda G Barrett, PA-C  ramipril (ALTACE) 5 MG capsule TAKE ONE CAPSULE EVERY DAY Patient taking differently: TAKE 5 MG BY MOUTH EVERY DAY 12/24/14  Yes Leonie Man, MD  tamsulosin  (FLOMAX) 0.4 MG CAPS capsule Take 1 capsule (0.4 mg total) by mouth daily. 03/14/13  Yes Leonie Man, MD   Allergies  Allergen Reactions  . Brilinta [Ticagrelor] Shortness Of Breath    Having breathing issues  . Advicor [Niacin-Lovastatin Er] Other (See Comments)    Headache, possibly other reactions  . Lipitor [Atorvastatin] Other (See Comments)    Headache, possibly other reactions  . Pravachol [Pravastatin Sodium] Other (See Comments)    Headache, possibly other reactions  . Simvastatin Other (See Comments)    Leg cramps    Social History   Social History  . Marital Status: Married    Spouse Name: N/A  . Number of Children: N/A  . Years of Education: N/A   Social History Main Topics  . Smoking status: Current Every Day Smoker --  0.50 packs/day for 48 years    Types: Cigarettes  . Smokeless tobacco: Never Used  . Alcohol Use: No  . Drug Use: No  . Sexual Activity: No   Other Topics Concern  . None   Social History Narrative   Married father of 2 with one grandchild. By his wife.   Continues to smoke one half pack a day.   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   Very physically active at work but no routine exercise.   Family History  Problem Relation Age of Onset  . Heart attack Mother     Wt Readings from Last 3 Encounters:  02/16/15 213 lb 11.2 oz (96.934 kg)  02/11/15 210 lb (95.255 kg)  01/30/15 211 lb 8 oz (95.936 kg)    PHYSICAL EXAM BP 132/84 mmHg  Pulse 82  Ht 5\' 8"  (1.727 m)  Wt 213 lb 11.2 oz (96.934 kg)  BMI 32.50 kg/m2 General appearance: alert, cooperative, appears stated age, no distress and mildly obese; he has his usual ruddy complexion. Seems frustrated. HEENT: [place]/AT, EOMI, MMM, anicteric sclera Neck: no adenopathy, no carotid bruit and no JVD Lungs: clear to auscultation bilaterally, normal percussion bilaterally and non-labored Heart: regular rate and rhythm, S1&S2  normal, no murmur, click, rub or gallops; Non-displaced PMI Abdomen: soft, non-tender; bowel sounds normal; no masses, no organomegaly; No HJR Extremities: extremities normal, atraumatic, no cyanosis, or edema  Pulses: 2+ and symmetric; Skin: normal and no lesions noted or jaundice noted Neurologic: Mental status: Alert, oriented, thought content appropriate Cranial nerves: normal (II-XII grossly intact)    Adult ECG Report  Not checked  Other studies Reviewed: Additional studies/ records that were reviewed today include:  Recent Labs:   Lab Results  Component Value Date   CHOL 172 01/30/2015   HDL 33* 01/30/2015   LDLCALC 117* 01/30/2015   TRIG 110 01/30/2015   CHOLHDL 5.2 01/30/2015    ASSESSMENT / PLAN: Problem List Items Addressed This Visit    NSTEMI (non-ST elevated myocardial infarction) ([place])    S/p PCI of Cx - now with negative Myoview. Monitor for recurrent Sx      Relevant Medications   simvastatin (ZOCOR) 40 MG tablet   Ischemic cardiomyopathy (Chronic)    No CHF Sx, but does have baseline dyspnea -- presumptively from underlying lung disease. On BB & ACE-I - no diuretic requirement.      Relevant Medications   simvastatin (ZOCOR) 40 MG tablet   Other Relevant Orders   Lipid panel   Comprehensive metabolic panel   Hyperlipidemia with target LDL less than 70 (Chronic)    Was on Vytorin previously - changed to generic simvastatin + Zetia.  Had been converted to Atorvastatin following NSTEMI in May 2106 -> did not tolerate.  Then was also noted to have cramps with Simvastatin.  Most recent lipids show worsening LDL & TC -- he is willing to retry Simvastatin in addition to Zetia.  Start with 20mg  x 2 weeks & increase to 40 mg if tolerating.  Recheck labs in ~3 months. If additional adjustments required, will refer to Lipid Clinic.      Relevant Medications   simvastatin (ZOCOR) 40 MG tablet   Other Relevant Orders   Lipid panel   Comprehensive  metabolic panel   History of ST elevation myocardial infarction (STEMI) of inferior wall (Chronic)    Distant event - noted scar persists on Myoview. Preserved /  mildly reduced Myoview. No CHF.      Relevant Medications   simvastatin (ZOCOR) 40 MG tablet   Essential hypertension (Chronic)    Well controlled on current regiment      Relevant Medications   simvastatin (ZOCOR) 40 MG tablet   Other Relevant Orders   Lipid panel   Comprehensive metabolic panel   Cigarette smoker one half pack a day or less (Chronic)    Still not interested in cessation. Ready to quit: No Counseling given: Yes  < 2 min       CAD S/P PCI - DES PCI to prox Cx XIENCE ALPINE RX 3.0X15  - Primary    On ASA & Effient -- can stop ASA      Relevant Medications   simvastatin (ZOCOR) 40 MG tablet   Other Relevant Orders   Lipid panel   Comprehensive metabolic panel   CAD in native artery - status post CABG x3 after PCI x2 to the RCA (LIMA-LAD, SVG-diagonal SVG-RI (Chronic)    Thankfully, recent Myoview with stable inferior infarct - no ischemia. ASA/Effient (ok to hold ASA for bleeding/bruising) On ACE-I & BB @ stable dose. Restarting statin      Relevant Medications   simvastatin (ZOCOR) 40 MG tablet    Other Visit Diagnoses    Polypharmacy        Relevant Orders    Lipid panel    Comprehensive metabolic panel       Current medicines are reviewed at length with the patient today. (+/- concerns) says he is willing to re-try simvastatin (tolerated Vytorin) The following changes have been made:   RE-TRY SIMVASTATIN 40 MG  - FOR THE FIRST 2 WEEKS TAKE 1/2 TABLET ( 20 MG) IF NO CRAMPS INCREASE TO 40  MG DAILY. CALL OFFICE WITH YOUR PROGRESS.  IF YOU ABLE TO TOLERATE  SIMVASTATIN HAVE LABS DONE IN MARCH 2017- WILL MAIL YOU A LAB SLIP   Your physician wants you to follow-up in [place].  Studies Ordered:   Orders Placed This Encounter  Procedures  . Lipid panel  .  Comprehensive metabolic panel      Leonie Man, M.D., M.S. Interventional Cardiologist   Pager # [phone]

## 2015-02-18 ENCOUNTER — Encounter: Payer: Self-pay | Admitting: Cardiology

## 2015-02-18 NOTE — Assessment & Plan Note (Signed)
S/p PCI of Cx - now with negative Myoview. Monitor for recurrent Sx

## 2015-02-18 NOTE — Assessment & Plan Note (Addendum)
Still not interested in cessation. Ready to quit: No Counseling given: Yes  < 2 min

## 2015-02-18 NOTE — Assessment & Plan Note (Signed)
On ASA & Effient -- can stop ASA

## 2015-02-18 NOTE — Assessment & Plan Note (Signed)
Was on Vytorin previously - changed to generic simvastatin + Zetia.  Had been converted to Atorvastatin following NSTEMI in May 2106 -> did not tolerate.  Then was also noted to have cramps with Simvastatin.  Most recent lipids show worsening LDL & TC -- he is willing to retry Simvastatin in addition to Zetia.  Start with 20mg  x 2 weeks & increase to 40 mg if tolerating.  Recheck labs in ~3 months. If additional adjustments required, will refer to Lipid Clinic.

## 2015-02-18 NOTE — Assessment & Plan Note (Signed)
Well controlled on current regiment

## 2015-02-18 NOTE — Assessment & Plan Note (Addendum)
No CHF Sx, but does have baseline dyspnea -- presumptively from underlying lung disease. On BB & ACE-I - no diuretic requirement.

## 2015-02-18 NOTE — Assessment & Plan Note (Signed)
Thankfully, recent Myoview with stable inferior infarct - no ischemia. ASA/Effient (ok to hold ASA for bleeding/bruising) On ACE-I & BB @ stable dose. Restarting statin

## 2015-02-18 NOTE — Assessment & Plan Note (Signed)
Distant event - noted scar persists on Myoview. Preserved / mildly reduced Myoview. No CHF.

## 2015-04-04 ENCOUNTER — Other Ambulatory Visit: Payer: Self-pay | Admitting: Cardiology

## 2015-04-05 NOTE — Telephone Encounter (Signed)
REFILL 

## 2015-05-10 ENCOUNTER — Telehealth: Payer: Self-pay | Admitting: *Deleted

## 2015-05-10 DIAGNOSIS — Z9861 Coronary angioplasty status: Principal | ICD-10-CM

## 2015-05-10 DIAGNOSIS — I10 Essential (primary) hypertension: Secondary | ICD-10-CM

## 2015-05-10 DIAGNOSIS — Z79899 Other long term (current) drug therapy: Secondary | ICD-10-CM

## 2015-05-10 DIAGNOSIS — I2510 Atherosclerotic heart disease of native coronary artery without angina pectoris: Secondary | ICD-10-CM

## 2015-05-10 DIAGNOSIS — E785 Hyperlipidemia, unspecified: Secondary | ICD-10-CM

## 2015-05-10 DIAGNOSIS — I255 Ischemic cardiomyopathy: Secondary | ICD-10-CM

## 2015-05-10 NOTE — Telephone Encounter (Signed)
MAIL LAB SLIP AND LETTER  cmp ,lipid

## 2015-05-10 NOTE — Telephone Encounter (Signed)
-----   Message from Raiford Simmonds, RN sent at 02/16/2015  3:18 PM EST ----- DO BY 06/01/14 LIPIDS ,CMP

## 2015-06-19 LAB — LIPID PANEL
Cholesterol: 121 mg/dL — ABNORMAL LOW (ref 125–200)
HDL: 36 mg/dL — ABNORMAL LOW (ref >=40)
LDL CALC: 69 mg/dL (ref <130)
TRIGLYCERIDES: 81 mg/dL (ref <150)
Total CHOL/HDL Ratio: 3.4 Ratio (ref <=5.0)
VLDL: 16 mg/dL (ref <30)

## 2015-06-19 LAB — COMPREHENSIVE METABOLIC PANEL
ALBUMIN: 3.7 g/dL (ref 3.6–5.1)
ALK PHOS: 67 U/L (ref 40–115)
ALT: 13 U/L (ref 9–46)
AST: 12 U/L (ref 10–35)
BUN: 16 mg/dL (ref 7–25)
CO2: 25 mmol/L (ref 20–31)
Calcium: 9 mg/dL (ref 8.6–10.3)
Chloride: 107 mmol/L (ref 98–110)
Creat: 1.17 mg/dL (ref 0.70–1.25)
Glucose, Bld: 97 mg/dL (ref 65–99)
POTASSIUM: 5 mmol/L (ref 3.5–5.3)
Sodium: 141 mmol/L (ref 135–146)
TOTAL PROTEIN: 6.2 g/dL (ref 6.1–8.1)
Total Bilirubin: 0.3 mg/dL (ref 0.2–1.2)

## 2015-06-22 ENCOUNTER — Telehealth: Payer: Self-pay | Admitting: *Deleted

## 2015-06-22 NOTE — Telephone Encounter (Signed)
-----   Message from Leonie Man, MD sent at 06/21/2015  9:27 PM EDT ----- Chemistry panel looks great. Liver and kidney function looks great. Sugars look great. Cholesterol level looks much better and total cholesterol is 121, HDL is up to 36 (almost to goal), and LDL is below 70. This is at goal. Continue current medications. At 6 month follow-up, I will check a CardioIQ panel to ensure screening (calculated) LDL levels correlate with measured  HARDING, Leonie Green, MD

## 2015-06-22 NOTE — Telephone Encounter (Signed)
Spoke to patient's wife. Result given . Verbalized understanding Schedule appointment

## 2015-08-17 ENCOUNTER — Ambulatory Visit (INDEPENDENT_AMBULATORY_CARE_PROVIDER_SITE_OTHER): Payer: BLUE CROSS/BLUE SHIELD | Admitting: Cardiology

## 2015-08-17 ENCOUNTER — Encounter: Payer: Self-pay | Admitting: Cardiology

## 2015-08-17 VITALS — BP 114/80 | HR 58 | Ht 68.0 in | Wt 218.4 lb

## 2015-08-17 DIAGNOSIS — I2510 Atherosclerotic heart disease of native coronary artery without angina pectoris: Secondary | ICD-10-CM

## 2015-08-17 DIAGNOSIS — Z9861 Coronary angioplasty status: Secondary | ICD-10-CM | POA: Diagnosis not present

## 2015-08-17 DIAGNOSIS — F17210 Nicotine dependence, cigarettes, uncomplicated: Secondary | ICD-10-CM

## 2015-08-17 DIAGNOSIS — I10 Essential (primary) hypertension: Secondary | ICD-10-CM

## 2015-08-17 DIAGNOSIS — I214 Non-ST elevation (NSTEMI) myocardial infarction: Secondary | ICD-10-CM

## 2015-08-17 DIAGNOSIS — E785 Hyperlipidemia, unspecified: Secondary | ICD-10-CM

## 2015-08-17 DIAGNOSIS — I255 Ischemic cardiomyopathy: Secondary | ICD-10-CM

## 2015-08-17 DIAGNOSIS — I2119 ST elevation (STEMI) myocardial infarction involving other coronary artery of inferior wall: Secondary | ICD-10-CM | POA: Diagnosis not present

## 2015-08-17 DIAGNOSIS — E669 Obesity, unspecified: Secondary | ICD-10-CM

## 2015-08-17 MED ORDER — ASPIRIN EC 81 MG PO TBEC: 81.0000 mg | DELAYED_RELEASE_TABLET | Freq: Every day | ORAL | Status: DC

## 2015-08-17 NOTE — Patient Instructions (Signed)
Your physician recommends that you schedule a follow-up appointment in: 6 months with Dr. Ellyn Hack   Your physician has recommended you make the following change in your medication: START back on Aspirin 81 mg daily; STOP Effient  If you need a refill on your cardiac medications before your next appointment, please call your pharmacy.

## 2015-08-17 NOTE — Progress Notes (Signed)
PCP: No PCP Per Patient  Clinic Note: Chief Complaint  Patient presents with  . Follow-up    6 months  pt states no Sx.  . Coronary Artery Disease  . Cardiomyopathy    HPI: Benjamin Robertson is a 65 y.o. male with a PMH below who presents today for Six-month follow-up CAD-CABG & PCI.Marland Kitchen  Benjamin Robertson was last seen on Feb 18, 2015  Recent Hospitalizations: Last hospitalization was 01/29/2015. He had a Myoview on December 1 prior to my last visit.  Studies Reviewed:   Myoview 02/11/2015: Low risk. Medium-sized, severe intensity extends inferior defect with no ischemia. There is inferior hypokinesis noted. Suggestive of inferior infarction.  Known prior infarction  Interval History: Benjamin Robertson presents today Doing pretty well. He has no major complaints. The biggest issue he has is when it's hot during summertime he gets pretty tired and fatigued Units a day. He is a before to this being his last summer having to work. He is not had any further episodes of any chest tightness or pressure with rest or exertion. No resting or exertional dyspnea unless he overdoes it. He pretty much at his baseline exertional dyspnea.  No PND, orthopnea or edema. No palpitations, lightheadedness, dizziness, weakness or syncope/near syncope. No TIA/amaurosis fugax symptoms. No melena, hematochezia, hematuria, or epstaxis. No claudication.  ROS: A comprehensive was performed. Review of Systems  Constitutional: Negative for weight loss and malaise/fatigue.  Eyes: Negative for blurred vision.  Respiratory: Positive for cough and shortness of breath.        Chronic baseline dyspnea and cough  Cardiovascular: Negative for claudication.  Gastrointestinal: Negative for heartburn and constipation.  Musculoskeletal: Positive for joint pain (Bilateral knee pain).  Neurological: Negative for headaches.  Psychiatric/Behavioral: Negative for depression and memory loss. The patient is not nervous/anxious and does not have  insomnia.   All other systems reviewed and are negative.   Past Medical History  Diagnosis Date  . CAD in native artery 1994, 2001    a) 2001 Exertional Angina --> Myoview: Inf infarct and Ant ischemia --> Cath: 95% pLAD, 99% D1, 80% RI, but Cx-OM1&2 OK , & patent RCA stent --> CABG: b) Cath in 2003 patent grafts, RCA & Cx; c) 08/2009 Myoview: Mod Basal-Mid inferoseptal, Basal-apical inferior Infarct. No ischemia.   . S/P CABG x 3 2001    LIMA-LAD, SVG-diagonal, SVG-Ramus Intermedius (RI) - patent by Cath in 2003  . Ischemic cardiomyopathy 1994, 2003    Echo February 2014: EF 40-45%; moderate HK of anterolateral myocardium.; Grade 1 diastolic dysfunction  . Hyperlipidemia LDL goal <70     At goal by Most recent labs October 2013: TC 123, TG 83, HDL 81, LDL 65  . Obesity (BMI 30.0-34.9)   . History of ST elevation myocardial infarction (STEMI) of inferior wall 1994; 1997    PCI of RCA - redo PCI BMS ML Vision 3.0 mm x 25 mm to RCA in 1997  . Childhood asthma   . Pneumonia 2014  . Arthritis     "qwhere" (07/30/2014)  . Kidney stones   . Hypertension     Past Surgical History  Procedure Laterality Date  . Coronary artery bypass graft  06/07/1999    x3. LIMA to distal LAD, SVG to first diag, and SVG to ramus  . Nm myoview ltd  08/19/2009    Moderafte perfusion seen in Basal inferoseptal, Basal inferior, Mid inferoseptal, Mid inferiorm, and Apical inferior regions. No ECG changes. EKG negative for  ischemia.  . Transthoracic echocardiogram  05/06/2012    EF 40-45%, LV cavity moderately dilated, systolic function mild-moderately reduced, moderate hypokinesis of anterolatewral myocardium.  . Carotid doppler  05/06/2012    Less than 50% diameter reduction of the Lft Subclavian.  . Lithotripsy  "2-3 times"  . Coronary angioplasty with stent placement  01/21/1996    PTCA of RCA-95% lesion  . Cardiac catheterization  06/06/1999    Recommend CABG  . Cardiac catheterization  10/04/2001    Patent  grafts with a 50% lesion in LAD beyond IMA insertion  . Cardiac catheterization N/A 07/31/2014    Procedure: Left Heart Cath and Cors/Grafts Angiography;  Surgeon: Jettie Booze, MD;  Location: [place] [facility];  Service: Cardiovascular;  Laterality: N/A;  . Cardiac catheterization N/A 07/31/2014    Procedure: Coronary Stent Intervention;  Surgeon: Jettie Booze, MD;  Location: [place] [facility];  Service: Cardiovascular;  Laterality: N/A;    Prior to Admission medications   Medication Sig Start Date End Date Taking? Authorizing Provider  metoprolol (LOPRESSOR) 50 MG tablet TAKE 1/2 TABLET BY MOUTH 2 TIMES A DAY 04/05/15  Yes Leonie Man, MD  nitroGLYCERIN (NITROLINGUAL) 0.4 MG/SPRAY spray Place 1 spray under the tongue every 5 (five) minutes x 3 doses as needed for chest pain. 09/08/14  Yes Leonie Man, MD  ramipril (ALTACE) 5 MG capsule TAKE ONE CAPSULE EVERY DAY 04/05/15  Yes Leonie Man, MD  simvastatin (ZOCOR) 40 MG tablet Take 1 tablet (40 mg total) by mouth at bedtime. 02/16/15  Yes Leonie Man, MD     Allergies  Allergen Reactions  . Brilinta [Ticagrelor] Shortness Of Breath    Having breathing issues  . Advicor [Niacin-Lovastatin Er] Other (See Comments)    Headache, possibly other reactions  . Lipitor [Atorvastatin] Other (See Comments)    Headache, possibly other reactions  . Pravachol [Pravastatin Sodium] Other (See Comments)    Headache, possibly other reactions  . Simvastatin Other (See Comments)    Leg cramps     Social History   Social History  . Marital Status: Married    Spouse Name: N/A  . Number of Children: N/A  . Years of Education: N/A   Social History Main Topics  . Smoking status: Current Every Day Smoker -- 0.50 packs/day for 48 years    Types: Cigarettes  . Smokeless tobacco: Never Used  . Alcohol Use: No  . Drug Use: No  . Sexual Activity: No   Other Topics Concern  . None   Social History Narrative   Married father  of 2 with one grandchild. By his wife.   Continues to smoke one half pack a day.   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   Very physically active at work but no routine exercise.    family history includes Heart attack in his mother.  Wt Readings from Last 3 Encounters:  08/17/15 218 lb 6.4 oz (99.066 kg)  02/16/15 213 lb 11.2 oz (96.934 kg)  02/11/15 210 lb (95.255 kg)    PHYSICAL EXAM BP 114/80 mmHg  Pulse 58  Ht 5\' 8"  (1.727 m)  Wt 218 lb 6.4 oz (99.066 kg)  BMI 33.22 kg/m2 General appearance: alert, cooperative, appears stated age, no distress and mildly obese; he has his usual ruddy complexion. Seems frustrated. HEENT: [place]/AT, EOMI, MMM, anicteric sclera Neck: no adenopathy, no carotid bruit and no JVD  Lungs: clear to auscultation bilaterally, normal percussion bilaterally and non-labored Heart: regular rate and rhythm, S1&S2 normal, no murmur, click, rub or gallops; Non-displaced PMI Abdomen: soft, non-tender; bowel sounds normal; no masses, no organomegaly; No HJR Extremities: extremities normal, atraumatic, no cyanosis, or edema  Pulses: 2+ and symmetric; Skin: normal and no lesions noted or jaundice noted Neurologic: Mental status: Alert, oriented, thought content appropriate Cranial nerves: normal (II-XII grossly intact)   Adult ECG Report  Rate: 58 ;  Rhythm: normal sinus rhythm and Rightward axis (98), nonspecific ST-T wave abnormality. Otherwise normal intervals, durations and voltage.;   Narrative Interpretation: Stable EKG from November 2016   Other studies Reviewed: Additional studies/ records that were reviewed today include:  Recent Labs:   Lab Results  Component Value Date   CHOL 121* 06/19/2015   HDL 36* 06/19/2015   LDLCALC 69 06/19/2015   TRIG 81 06/19/2015   CHOLHDL 3.4 06/19/2015    ASSESSMENT / PLAN: Problem List Items Addressed This Visit    Obesity (BMI 30.0-34.9)  (Chronic)    The patient understands the need to lose weight with diet and exercise. We have discussed specific strategies for this.      NSTEMI (non-ST elevated myocardial infarction) [facility])    Physical last summer. Had PCI to the ostial circumflex - follow-up Myoview was negative for ischemia in that distribution.      Relevant Medications   aspirin EC 81 MG tablet   Other Relevant Orders   EKG 12-Lead (Completed)   Ischemic cardiomyopathy (Chronic)    EF 40-45% by echocardiogram. As expected inferior hypokinesis. He is on stable dose of beta blocker and ACE inhibitor. No requirement for diuretic as is no heart failure symptoms.      Relevant Medications   aspirin EC 81 MG tablet   Hyperlipidemia with target LDL less than 70 (Chronic)    He actually is pretty much at goal by son his most recent labs. At that were fine with just the simvastatin. Can probably just follow-up labs in one year. Tolerating simvastatin well.      Relevant Medications   aspirin EC 81 MG tablet   History of ST elevation myocardial infarction (STEMI) of inferior wall (Chronic)    Persistent scar on Myoview with no new lesions on cardiac catheterization. Otherwise preserved EF. No signs of CHF. No anginal symptoms now.      Relevant Medications   aspirin EC 81 MG tablet   Other Relevant Orders   EKG 12-Lead (Completed)   Essential hypertension (Chronic)    Blood pressure well controlled on beta blocker and ACE inhibitor.      Relevant Medications   aspirin EC 81 MG tablet   Other Relevant Orders   EKG 12-Lead (Completed)   Cigarette smoker one half pack a day or less (Chronic)    Still does not seem to reach cessation. Not ready to quit. I counseled briefly, but he declined choices for cessation.      CAD S/P PCI - DES PCI to prox Cx XIENCE ALPINE RX 3.0X15  (Chronic)    Now that he is completing a year of Effient, I recommend that he go back on aspirin since he would like to stop other  antiplatelet agents.  I did explain to him that there would be a possible risk of stent thrombosis or stenosis on aspirin alone, but he would prefer to be on simply aspirin. Since we are now 1 year out from PCI, there is no convincing  data to continue beyond 1 year.      Relevant Medications   aspirin EC 81 MG tablet   CAD in native artery - status post CABG x3 after PCI x2 to the RCA (LIMA-LAD, SVG-diagonal SVG-RI - Primary (Chronic)    Grafts appear to be patent the most recent lesion was in the nongrafted circumflex now status post PCI. He is finishing up his first year of Effient. He would prefer to avoid further antiplatelet treatment be on aspirin. 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 Is very difficult with him being on antiplatelets. He is on beta blocker, ACE inhibitor and statin. He never did get Zetia filled.      Relevant Medications   aspirin EC 81 MG tablet      Current medicines are reviewed at length with the patient today. (+/- concerns) none  The following changes have been made: OK to stop Effient, restart ASA 81 mg.  Studies Ordered:   Orders Placed This Encounter  Procedures  . EKG 12-Lead    ROV 6 months   Glenetta Hew, M.D., M.S. Interventional Cardiologist   Pager # [phone] Phone # [phone] [address] [place], [place] [address]

## 2015-08-18 ENCOUNTER — Encounter: Payer: Self-pay | Admitting: Cardiology

## 2015-08-18 NOTE — Assessment & Plan Note (Signed)
Grafts appear to be patent the most recent lesion was in the nongrafted circumflex now status post PCI. He is finishing up his first year of Effient. He would prefer to avoid further antiplatelet treatment be on aspirin. 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 Is very difficult with him being on antiplatelets. He is on beta blocker, ACE inhibitor and statin. He never did get Zetia filled.

## 2015-08-18 NOTE — Assessment & Plan Note (Signed)
Persistent scar on Myoview with no new lesions on cardiac catheterization. Otherwise preserved EF. No signs of CHF. No anginal symptoms now.

## 2015-08-18 NOTE — Assessment & Plan Note (Signed)
Physical last summer. Had PCI to the ostial circumflex - follow-up Myoview was negative for ischemia in that distribution.

## 2015-08-18 NOTE — Assessment & Plan Note (Signed)
Still does not seem to reach cessation. Not ready to quit. I counseled briefly, but he declined choices for cessation.

## 2015-08-18 NOTE — Assessment & Plan Note (Signed)
The patient understands the need to lose weight with diet and exercise. We have discussed specific strategies for this.  

## 2015-08-18 NOTE — Assessment & Plan Note (Signed)
He actually is pretty much at goal by son his most recent labs. At that were fine with just the simvastatin. Can probably just follow-up labs in one year. Tolerating simvastatin well.

## 2015-08-18 NOTE — Assessment & Plan Note (Signed)
EF 40-45% by echocardiogram. As expected inferior hypokinesis. He is on stable dose of beta blocker and ACE inhibitor. No requirement for diuretic as is no heart failure symptoms.

## 2015-08-18 NOTE — Assessment & Plan Note (Signed)
Blood pressure well controlled on beta blocker and ACE inhibitor.

## 2015-08-18 NOTE — Assessment & Plan Note (Signed)
Now that he is completing a year of Effient, I recommend that he go back on aspirin since he would like to stop other antiplatelet agents.  I did explain to him that there would be a possible risk of stent thrombosis or stenosis on aspirin alone, but he would prefer to be on simply aspirin. Since we are now 1 year out from PCI, there is no convincing data to continue beyond 1 year.

## 2015-09-03 ENCOUNTER — Other Ambulatory Visit: Payer: Self-pay | Admitting: Cardiology

## 2015-09-28 ENCOUNTER — Other Ambulatory Visit: Payer: Self-pay | Admitting: Cardiology

## 2015-12-31 ENCOUNTER — Other Ambulatory Visit: Payer: Self-pay | Admitting: Urology

## 2016-02-18 ENCOUNTER — Encounter (HOSPITAL_COMMUNITY): Payer: Self-pay

## 2016-02-18 NOTE — Patient Instructions (Signed)
Benjamin Robertson  02/18/2016   Your procedure is scheduled on: 02/28/2016    Report to [facility] Main  Entrance take [place]  elevators to 3rd floor to  [place] at   [place] AM.  Call this number if you have problems the morning of surgery [phone]   Remember: ONLY 1 PERSON MAY GO WITH YOU TO SHORT STAY TO GET  READY MORNING OF [place].  Do not eat food or drink liquids :After Midnight.     Take these medicines the morning of surgery with A SIP OF WATER: Metoprolol ( Lopressor), Proair Inhaler and bring, Urocit-K, Flomax                                 You may not have any metal on your body including hair pins and              piercings  Do not wear jewelry, lotions, powders or perfumes, deodorant                          Men may shave face and neck.   Do not bring valuables to the hospital. [place].  Contacts, dentures or bridgework may not be worn into surgery.  .     Patients discharged the day of surgery will not be allowed to drive home.  Name and phone number of your driver:  Special Instructions: N/A              Please read over the following fact sheets you were given: _____________________________________________________________________             [facility] - Preparing for Surgery Before surgery, you can play an important role.  Because skin is not sterile, your skin needs to be as free of germs as possible.  You can reduce the number of germs on your skin by washing with CHG (chlorahexidine gluconate) soap before surgery.  CHG is an antiseptic cleaner which kills germs and bonds with the skin to continue killing germs even after washing. Please DO NOT use if you have an allergy to CHG or antibacterial soaps.  If your skin becomes reddened/irritated stop using the CHG and inform your nurse when you arrive at Short Stay. Do not shave (including legs and underarms) for at least 48  hours prior to the first CHG shower.  You may shave your face/neck. Please follow these instructions carefully:  1.  Shower with CHG Soap the night before surgery and the  morning of Surgery.  2.  If you choose to wash your hair, wash your hair first as usual with your  normal  shampoo.  3.  After you shampoo, rinse your hair and body thoroughly to remove the  shampoo.                           4.  Use CHG as you would any other liquid soap.  You can apply chg directly  to the skin and wash                       Gently with a scrungie or  clean washcloth.  5.  Apply the CHG Soap to your body ONLY FROM THE NECK DOWN.   Do not use on face/ open                           Wound or open sores. Avoid contact with eyes, ears mouth and genitals (private parts).                       Wash face,  Genitals (private parts) with your normal soap.             6.  Wash thoroughly, paying special attention to the area where your surgery  will be performed.  7.  Thoroughly rinse your body with warm water from the neck down.  8.  DO NOT shower/wash with your normal soap after using and rinsing off  the CHG Soap.                9.  Pat yourself dry with a clean towel.            10.  Wear clean pajamas.            11.  Place clean sheets on your bed the night of your first shower and do not  sleep with pets. Day of Surgery : Do not apply any lotions/deodorants the morning of surgery.  Please wear clean clothes to the hospital/surgery center.  FAILURE TO FOLLOW THESE INSTRUCTIONS MAY RESULT IN THE CANCELLATION OF YOUR SURGERY PATIENT SIGNATURE_________________________________  NURSE SIGNATURE__________________________________  ________________________________________________________________________

## 2016-02-22 ENCOUNTER — Encounter (HOSPITAL_COMMUNITY)
Admission: RE | Admit: 2016-02-22 | Discharge: 2016-02-22 | Disposition: A | Discharge status: Home / Self Care | Payer: BLUE CROSS/BLUE SHIELD | Source: Ambulatory Visit | Attending: Urology | Admitting: Urology

## 2016-02-22 ENCOUNTER — Encounter (HOSPITAL_COMMUNITY): Payer: Self-pay

## 2016-02-22 DIAGNOSIS — Z01818 Encounter for other preprocedural examination: Secondary | ICD-10-CM | POA: Diagnosis not present

## 2016-02-22 LAB — BASIC METABOLIC PANEL
Anion gap: 7 (ref 5–15)
BUN: 18 mg/dL (ref 6–20)
CHLORIDE: 105 mmol/L (ref 101–111)
CO2: 25 mmol/L (ref 22–32)
CREATININE: 1 mg/dL (ref 0.61–1.24)
Calcium: 9.3 mg/dL (ref 8.9–10.3)
GFR calc non Af Amer: >60 mL/min (ref >60)
GLUCOSE: 104 mg/dL — AB (ref 65–99)
Potassium: 4.7 mmol/L (ref 3.5–5.1)
Sodium: 137 mmol/L (ref 135–145)

## 2016-02-22 LAB — CBC
HCT: 49.4 % (ref 39.0–52.0)
Hemoglobin: 17 g/dL (ref 13.0–17.0)
MCH: 32.4 pg (ref 26.0–34.0)
MCHC: 34.4 g/dL (ref 30.0–36.0)
MCV: 94.3 fL (ref 78.0–100.0)
PLATELETS: 210 10*3/uL (ref 150–400)
RBC: 5.24 MIL/uL (ref 4.22–5.81)
RDW: 13.1 % (ref 11.5–15.5)
WBC: 8.9 10*3/uL (ref 4.0–10.5)

## 2016-02-22 NOTE — Progress Notes (Signed)
EKG-08/17/15-epic  02/11/15- Stress- EPIC  07/2014- cath 08/17/15- LOV- card- EPIC

## 2016-02-27 NOTE — H&P (Signed)
HPI: Benjamin Robertson is a 65 year-old male with a symptomatic left hydrocele.  The patient was last seen 09/01/2015. The stone(s) were on both sides. He did not pass stones.   The patient has not had any flank pain since they were last seen. The patient denies any progressive voiding symptoms. He does not have blood in his urine. He is not currently having flank pain, back pain, groin pain, nausea, vomiting, fever or chills. He has not caught a stone in his urine strainer since his symptoms began.   Nephrolithiasis: He has known bilateral renal calculi.  He has undergone lithotripsy in '88, '03 and '07.  CT 12/2010: bilateral stones ([id number])  Metabolic workup: Serum studies were noted to be normal. 24-hour urine revealed mild hyperoxaluria and hypocitraturia.  Currently: Potassium citrate 10 meq BID   Interval history: He has not had any flank pain or hematuria or other symptoms to suggest passage of a stone. He did want to discuss treatment of his left hydrocele. He also indicated that occasionally he has some mild discomfort in that location. He said he has to sleep with a pillow between his legs and it is symptomatic enough that he wants to consider having something done about it.     ALLERGIES: No Allergies    MEDICATIONS: Simvastatin 40 mg tablet 1 tablet PO Daily  Aspirin 325 MG Oral Tablet Oral  Metoprolol Tartrate 50 MG Oral Tablet Oral  Nitroglycerin SUBL Sublingual  Potassium Citrate Er 10 meq (1,080 mg) tablet, extended release 1 tablet PO BID  Potassium Citrate ER 10 MEQ (1080 MG) Oral Tablet Extended Release 0 Oral  Ramipril 5 MG Oral Capsule Oral  Tamsulosin HCl - 0.4 MG Oral Capsule 0 Oral Bedtime     GU PSH: Renal ESWL - 2007      PSH Notes: Cath Stent Placement, Coronary Artery Bypass Graft (CABG), Lithotripsy, Heart Surgery   NON-GU PSH: Coronary Artery Bypass Grafting (cabg) - 2008    GU PMH: Flank Pain - 09/01/2015 Orchitis - 09/01/2015 BPH w/LUTS, Benign prostatic  hyperplasia with lower urinary tract symptoms - 2016 Encounter for Prostate Cancer screening, Prostate cancer screening - 2016 Kidney Stone, Bilateral, Nephrolithiasis - 2016 Nocturia, Nocturia - 2015 Hydrocele, Unspec, Hydrocele, left - 2014 Left lower quadrant pain, Abdominal Pain In The Left Lower Belly (LLQ) - 2014 Urinary Frequency, Increased urinary frequency - 2014      PMH Notes: BPH: frequency and nocturia 2. This did improve with alpha blockade therapy.  Currently: flomax   Orchitis & hydrocele on left. This responded to Septra DS in the past.   Right adrenal adenoma: 2 x 1.4 cm, benign     NON-GU PMH: Encounter for general adult medical examination without abnormal findings, Encounter for preventive health examination - 2015 Asthma, Asthma - 2014 Personal history of other diseases of the circulatory system, History of heart failure - 2014, History of hypertension, - 2014 Personal history of other endocrine, nutritional and metabolic disease, History of hypercholesterolemia - 2014    FAMILY HISTORY: Acute Myocardial Infarction - Mother Death - Father, Mother Family Health Status Number - Runs In Family Hypertension - Sister, Brother Lung Cancer - Sister Urologic Disorder - Runs In Family   SOCIAL HISTORY: Marital Status: Married Current Smoking Status: Patient smokes. Has smoked since 08/11/1985.  Drinks 3 caffeinated drinks per day. Patient's occupation Publishing copy.     Notes: Current every day smoker, Death In The Family Mother, Death In The Family Father, Occupation:, Marital History -  Currently Married, Caffeine Use, Tobacco Use, Alcohol Use   REVIEW OF SYSTEMS:    GU Review Male:   Patient denies get up at night to urinate, have to strain to urinate , frequent urination, trouble starting your stream, erection problems, leakage of urine, stream starts and stops, hard to postpone urination, penile pain, and burning/ pain with urination.  Gastrointestinal  (Upper):   Patient denies nausea, vomiting, and indigestion/ heartburn.  Gastrointestinal (Lower):   Patient denies diarrhea and constipation.  Constitutional:   Patient denies fever, night sweats, weight loss, and fatigue.  Skin:   Patient denies skin rash/ lesion and itching.  Eyes:   Patient denies blurred vision and double vision.  Ears/ Nose/ Throat:   Patient denies sore throat and sinus problems.  Hematologic/Lymphatic:   Patient denies swollen glands and easy bruising.  Cardiovascular:   Patient denies leg swelling and chest pains.  Respiratory:   Patient denies cough and shortness of breath.  Endocrine:   Patient denies excessive thirst.  Musculoskeletal:   Patient denies back pain and joint pain.  Neurological:   Patient denies headaches and dizziness.  Psychologic:   Patient denies depression and anxiety.   VITAL SIGNS:      BP 124/62 mmHg  Pulse 62 /min   GU PHYSICAL EXAMINATION:    Anus and Perineum: No hemorrhoids. No anal stenosis. No rectal fissure, no anal fissure. No edema, no dimple, no perineal tenderness, no anal tenderness.  Scrotum: He has a moderate left hydrocele.  Prostate: 40 gram or 2+ size. Left lobe normal consistency, right lobe normal consistency. Symmetrical lobes. No prostate nodule. Left lobe no tenderness, right lobe no tenderness.  Seminal Vesicles: Nonpalpable.  Sphincter Tone: Normal sphincter. No rectal tenderness. No rectal mass.    MULTI-SYSTEM PHYSICAL EXAMINATION:    Constitutional: Well-nourished. No physical deformities. Normally developed. Good grooming.  Neck: Neck symmetrical, not swollen. Normal tracheal position.  Respiratory: No labored breathing, no use of accessory muscles.   Cardiovascular: Normal temperature, normal extremity pulses, no swelling, no varicosities.  Lymphatic: No enlargement of neck, axillae, groin.  Skin: No paleness, no jaundice, no cyanosis. No lesion, no ulcer, no rash.  Neurologic / Psychiatric: Oriented to  time, oriented to place, oriented to person. No depression, no anxiety, no agitation.  Gastrointestinal: No mass, no tenderness, no rigidity, non obese abdomen.  Eyes: Normal conjunctivae. Normal eyelids.  Ears, Nose, Mouth, and Throat: Left ear no scars, no lesions, no masses. Right ear no scars, no lesions, no masses. Nose no scars, no lesions, no masses. Normal hearing. Normal lips.  Musculoskeletal: Normal gait and station of head and neck.       PAST DATA REVIEWED:  Source Of History:  Patient, Outside Source  Lab Test Review:   BUN/Creatinine  Records Review:   Previous Patient Records, POC Tool   04/09/14 04/09/13 05/10/10 02/23/09 08/23/07  PSA  Total PSA 0.58  0.49  0.64  0.71  0.56    Notes:                     His creatinine in 4/17 was 1.17.   PROCEDURES:         KUB - 74000  A single view of the abdomen is obtained.      I am unable to identify any calcification overlying the right or left kidney consistent with a stone. He does have some vascular calcifications seen in the area of the right kidney.  Urinalysis w/Scope - 81001 Dipstick Dipstick Cont'd Micro  Specimen: Voided Bilirubin: Neg WBC/hpf: 6-10/hpf  Color: Yellow Ketones: Neg RBC/hpf: 0-2/hpf  Appearance: Clear Blood: Neg Bacteria: Rare  Specific Gravity: 1.020 Protein: Neg Cystals: NS (Not Seen)  pH: 5.5 Urobilinogen: 0.2 Casts: NS (Not Seen)  Glucose: Neg Nitrites: Neg Trichomonas: Not Present    Leukocyte Esterase: 1+ Mucous: Not Present      Epithelial Cells: NS (Not Seen)      Yeast: NS (Not Seen)      Sperm: Not Present    ASSESSMENT:      ICD-10 Details  1 GU:   Personal Hx urinary calculi - Z87.442 Stable - He has no evidence of recurrent calculus disease. I don't believe continued imaging will therefore be necessary and will be performed on an as-needed basis.  2   Encysted hydrocele - N43.0 Stable - He has a moderate left hydrocele that is symptomatic. He is interested in having it  treated surgically and will contact me when he has time off work so we can schedule that.  3   Encounter for Prostate Cancer screening - Z12.5 Stable - His prostate was noted to be benign. I will obtain a PSA today. I will tentatively have him return in 1 year for repeat DRE and PSA.          Notes:   His left hydrocele is symptomatic. We therefore discussed aspiration and its high probability of recurrence as well as surgical correction. I went over the procedure with him in detail including the incision used, the risks and complications, the probability of success as well as the outpatient nature of the procedure and the anticipated postoperative course. He understands and will contact me when he wants to proceed.    PLAN: Left hydrocelectomy

## 2016-02-27 NOTE — Anesthesia Preprocedure Evaluation (Addendum)
Anesthesia Evaluation  Patient identified by MRN, date of birth, ID band Patient awake    Reviewed: Allergy & Precautions, NPO status , Patient's Chart, lab work & pertinent test results  Airway Mallampati: III  TM Distance: >3 FB Neck ROM: Full    Dental  (+) Dental Advisory Given   Pulmonary asthma , Current Smoker,    breath sounds clear to auscultation       Cardiovascular hypertension, Pt. on medications and Pt. on home beta blockers (-) angina+ CAD, + Past MI, + Cardiac Stents and + CABG   Rhythm:Regular Rate:Normal     Neuro/Psych negative neurological ROS     GI/Hepatic negative GI ROS, Neg liver ROS,   Endo/Other  negative endocrine ROS  Renal/GU Renal disease     Musculoskeletal  (+) Arthritis ,   Abdominal   Peds  Hematology negative hematology ROS (+)   Anesthesia Other Findings   Reproductive/Obstetrics                            Lab Results  Component Value Date   WBC 8.9 02/22/2016   HGB 17.0 02/22/2016   HCT 49.4 02/22/2016   MCV 94.3 02/22/2016   PLT 210 02/22/2016   Lab Results  Component Value Date   CREATININE 1.00 02/22/2016   BUN 18 02/22/2016   NA 137 02/22/2016   K 4.7 02/22/2016   CL 105 02/22/2016   CO2 25 02/22/2016    Anesthesia Physical Anesthesia Plan  ASA: III  Anesthesia Plan: General   Post-op Pain Management:    Induction: Intravenous  Airway Management Planned: LMA  Additional Equipment:   Intra-op Plan:   Post-operative Plan: Extubation in OR  Informed Consent: I have reviewed the patients History and Physical, chart, labs and discussed the procedure including the risks, benefits and alternatives for the proposed anesthesia with the patient or authorized representative who has indicated his/her understanding and acceptance.   Dental advisory given  Plan Discussed with: CRNA  Anesthesia Plan Comments:          Anesthesia Quick Evaluation

## 2016-02-28 ENCOUNTER — Ambulatory Visit (HOSPITAL_COMMUNITY)
Admission: RE | Admit: 2016-02-28 | Discharge: 2016-02-28 | Disposition: A | Discharge status: Home / Self Care | Payer: BLUE CROSS/BLUE SHIELD | Source: Ambulatory Visit | Attending: Urology | Admitting: Urology

## 2016-02-28 ENCOUNTER — Encounter (HOSPITAL_COMMUNITY)
Admission: RE | Disposition: A | Discharge status: Home / Self Care | Payer: Self-pay | Source: Ambulatory Visit | Attending: Urology

## 2016-02-28 ENCOUNTER — Ambulatory Visit (HOSPITAL_COMMUNITY): Payer: BLUE CROSS/BLUE SHIELD | Admitting: Anesthesiology

## 2016-02-28 ENCOUNTER — Encounter (HOSPITAL_COMMUNITY): Payer: Self-pay | Admitting: *Deleted

## 2016-02-28 DIAGNOSIS — R351 Nocturia: Secondary | ICD-10-CM | POA: Insufficient documentation

## 2016-02-28 DIAGNOSIS — Z955 Presence of coronary angioplasty implant and graft: Secondary | ICD-10-CM | POA: Diagnosis not present

## 2016-02-28 DIAGNOSIS — Z951 Presence of aortocoronary bypass graft: Secondary | ICD-10-CM | POA: Insufficient documentation

## 2016-02-28 DIAGNOSIS — N452 Orchitis: Secondary | ICD-10-CM | POA: Insufficient documentation

## 2016-02-28 DIAGNOSIS — Z79899 Other long term (current) drug therapy: Secondary | ICD-10-CM | POA: Insufficient documentation

## 2016-02-28 DIAGNOSIS — N401 Enlarged prostate with lower urinary tract symptoms: Secondary | ICD-10-CM | POA: Diagnosis not present

## 2016-02-28 DIAGNOSIS — R350 Frequency of micturition: Secondary | ICD-10-CM | POA: Insufficient documentation

## 2016-02-28 DIAGNOSIS — Z87442 Personal history of urinary calculi: Secondary | ICD-10-CM | POA: Insufficient documentation

## 2016-02-28 DIAGNOSIS — F17200 Nicotine dependence, unspecified, uncomplicated: Secondary | ICD-10-CM | POA: Diagnosis not present

## 2016-02-28 DIAGNOSIS — N433 Hydrocele, unspecified: Secondary | ICD-10-CM

## 2016-02-28 DIAGNOSIS — N430 Encysted hydrocele: Secondary | ICD-10-CM | POA: Diagnosis not present

## 2016-02-28 DIAGNOSIS — I10 Essential (primary) hypertension: Secondary | ICD-10-CM | POA: Insufficient documentation

## 2016-02-28 DIAGNOSIS — I2510 Atherosclerotic heart disease of native coronary artery without angina pectoris: Secondary | ICD-10-CM | POA: Insufficient documentation

## 2016-02-28 DIAGNOSIS — Z7982 Long term (current) use of aspirin: Secondary | ICD-10-CM | POA: Insufficient documentation

## 2016-02-28 DIAGNOSIS — I252 Old myocardial infarction: Secondary | ICD-10-CM | POA: Diagnosis not present

## 2016-02-28 HISTORY — PX: HYDROCELE EXCISION: SHX482

## 2016-02-28 SURGERY — HYDROCELECTOMY
Anesthesia: General | Laterality: Left

## 2016-02-28 MED ORDER — HYDROCODONE-ACETAMINOPHEN 5-325 MG PO TABS
1.0000 | ORAL_TABLET | Freq: Once | ORAL | Status: AC
  Administered 2016-02-28: 1 via ORAL
  Filled 2016-02-28: qty 1

## 2016-02-28 MED ORDER — BACITRACIN-NEOMYCIN-POLYMYXIN OINTMENT TUBE
TOPICAL_OINTMENT | CUTANEOUS | Status: DC | PRN
  Administered 2016-02-28: 1 via TOPICAL

## 2016-02-28 MED ORDER — DEXAMETHASONE SODIUM PHOSPHATE 10 MG/ML IJ SOLN
INTRAMUSCULAR | Status: DC | PRN
Start: 2016-02-28 — End: 2016-02-28
  Administered 2016-02-28: 10 mg via INTRAVENOUS

## 2016-02-28 MED ORDER — LACTATED RINGERS IV SOLN
INTRAVENOUS | Status: DC
  Administered 2016-02-28: 09:00:00 via INTRAVENOUS

## 2016-02-28 MED ORDER — PROMETHAZINE HCL 25 MG/ML IJ SOLN: 6.2500 mg | INTRAMUSCULAR | Status: DC | PRN

## 2016-02-28 MED ORDER — BACITRACIN-NEOMYCIN-POLYMYXIN 400-5-5000 EX OINT
TOPICAL_OINTMENT | CUTANEOUS | Status: AC
  Filled 2016-02-28: qty 1

## 2016-02-28 MED ORDER — ONDANSETRON HCL 4 MG/2ML IJ SOLN
INTRAMUSCULAR | Status: AC
  Filled 2016-02-28: qty 2

## 2016-02-28 MED ORDER — FENTANYL CITRATE (PF) 100 MCG/2ML IJ SOLN
INTRAMUSCULAR | Status: DC | PRN
  Administered 2016-02-28 (×2): 50 ug via INTRAVENOUS

## 2016-02-28 MED ORDER — BUPIVACAINE HCL (PF) 0.25 % IJ SOLN
INTRAMUSCULAR | Status: DC | PRN
  Administered 2016-02-28: 13 mL

## 2016-02-28 MED ORDER — ONDANSETRON HCL 4 MG/2ML IJ SOLN
INTRAMUSCULAR | Status: DC | PRN
  Administered 2016-02-28: 4 mg via INTRAVENOUS

## 2016-02-28 MED ORDER — OXYCODONE HCL 10 MG PO TABS: 10.0000 mg | ORAL_TABLET | ORAL | 0 refills | Status: DC | PRN

## 2016-02-28 MED ORDER — HYDROMORPHONE HCL 2 MG/ML IJ SOLN
0.3000 mg | INTRAMUSCULAR | Status: DC | PRN
  Administered 2016-02-28 (×4): 0.5 mg via INTRAVENOUS

## 2016-02-28 MED ORDER — 0.9 % SODIUM CHLORIDE (POUR BTL) OPTIME
TOPICAL | Status: DC | PRN
  Administered 2016-02-28: 1000 mL

## 2016-02-28 MED ORDER — PROPOFOL 10 MG/ML IV BOLUS
INTRAVENOUS | Status: DC | PRN
  Administered 2016-02-28: 200 mg via INTRAVENOUS

## 2016-02-28 MED ORDER — CEFAZOLIN SODIUM-DEXTROSE 2-4 GM/100ML-% IV SOLN
2.0000 g | INTRAVENOUS | Status: AC
  Administered 2016-02-28: 2 g via INTRAVENOUS

## 2016-02-28 MED ORDER — MIDAZOLAM HCL 5 MG/5ML IJ SOLN
INTRAMUSCULAR | Status: DC | PRN
  Administered 2016-02-28: 2 mg via INTRAVENOUS

## 2016-02-28 MED ORDER — HYDROMORPHONE HCL 2 MG/ML IJ SOLN
INTRAMUSCULAR | Status: AC
  Filled 2016-02-28: qty 1

## 2016-02-28 MED ORDER — LIDOCAINE 2% (20 MG/ML) 5 ML SYRINGE
INTRAMUSCULAR | Status: AC
  Filled 2016-02-28: qty 5

## 2016-02-28 MED ORDER — DEXAMETHASONE SODIUM PHOSPHATE 10 MG/ML IJ SOLN
INTRAMUSCULAR | Status: AC
  Filled 2016-02-28: qty 1

## 2016-02-28 MED ORDER — MIDAZOLAM HCL 2 MG/2ML IJ SOLN
INTRAMUSCULAR | Status: AC
  Filled 2016-02-28: qty 2

## 2016-02-28 MED ORDER — BUPIVACAINE HCL (PF) 0.25 % IJ SOLN
INTRAMUSCULAR | Status: AC
  Filled 2016-02-28: qty 30

## 2016-02-28 MED ORDER — PROPOFOL 10 MG/ML IV BOLUS
INTRAVENOUS | Status: AC
  Filled 2016-02-28: qty 20

## 2016-02-28 MED ORDER — FENTANYL CITRATE (PF) 100 MCG/2ML IJ SOLN
INTRAMUSCULAR | Status: AC
  Filled 2016-02-28: qty 2

## 2016-02-28 MED ORDER — LIDOCAINE 2% (20 MG/ML) 5 ML SYRINGE
INTRAMUSCULAR | Status: DC | PRN
  Administered 2016-02-28: 100 mg via INTRAVENOUS

## 2016-02-28 MED ORDER — LACTATED RINGERS IV SOLN
INTRAVENOUS | Status: DC | PRN
  Administered 2016-02-28: 07:00:00 via INTRAVENOUS

## 2016-02-28 MED ORDER — CEFAZOLIN SODIUM-DEXTROSE 2-4 GM/100ML-% IV SOLN
INTRAVENOUS | Status: AC
  Filled 2016-02-28: qty 100

## 2016-02-28 SURGICAL SUPPLY — 30 items
APL SKNCLS STERI-STRIP NONHPOA (GAUZE/BANDAGES/DRESSINGS)
BENZOIN TINCTURE PRP APPL 2/3 (GAUZE/BANDAGES/DRESSINGS) IMPLANT
BNDG GAUZE ELAST 4 BULKY (GAUZE/BANDAGES/DRESSINGS) ×1 IMPLANT
COVER SURGICAL LIGHT HANDLE (MISCELLANEOUS) ×2 IMPLANT
DRAIN PENROSE 18X1/4 LTX STRL (WOUND CARE) ×1 IMPLANT
DRAPE LAPAROTOMY T 98X78 PEDS (DRAPES) ×2 IMPLANT
ELECT REM PT RETURN 9FT ADLT (ELECTROSURGICAL) ×2
ELECTRODE REM PT RTRN 9FT ADLT (ELECTROSURGICAL) ×1 IMPLANT
GAUZE SPONGE 4X4 12PLY STRL (GAUZE/BANDAGES/DRESSINGS) IMPLANT
GLOVE BIOGEL M 8.0 STRL (GLOVE) ×2 IMPLANT
GOWN STRL REUS W/TWL XL LVL3 (GOWN DISPOSABLE) ×2 IMPLANT
KIT BASIN OR (CUSTOM PROCEDURE TRAY) ×2 IMPLANT
NEEDLE HYPO 22GX1.5 SAFETY (NEEDLE) ×2 IMPLANT
PACK GENERAL/GYN (CUSTOM PROCEDURE TRAY) ×2 IMPLANT
STRIP CLOSURE SKIN 1/2X4 (GAUZE/BANDAGES/DRESSINGS) IMPLANT
SUPPORT SCROTAL LG STRP (MISCELLANEOUS) ×2 IMPLANT
SUT CHROMIC 2 0 SH (SUTURE) IMPLANT
SUT CHROMIC 3 0 SH 27 (SUTURE) ×3 IMPLANT
SUT MNCRL AB 4-0 PS2 18 (SUTURE) IMPLANT
SUT SILK 0 (SUTURE)
SUT SILK 0 30XBRD TIE 6 (SUTURE) ×1 IMPLANT
SUT SILK 2 0 (SUTURE)
SUT SILK 2-0 18XBRD TIE 12 (SUTURE) ×1 IMPLANT
SUT SILK 3 0 SH 30 (SUTURE) IMPLANT
SUT VIC AB 2-0 SH 27 (SUTURE)
SUT VIC AB 2-0 SH 27X BRD (SUTURE) IMPLANT
SUT VIC AB 3-0 SH 27 (SUTURE)
SUT VIC AB 3-0 SH 27XBRD (SUTURE) IMPLANT
SYR CONTROL 10ML LL (SYRINGE) ×2 IMPLANT
TOWEL OR 17X26 10 PK STRL BLUE (TOWEL DISPOSABLE) ×3 IMPLANT

## 2016-02-28 NOTE — Anesthesia Postprocedure Evaluation (Signed)
Anesthesia Post Note  Patient: Benjamin Robertson  Procedure(s) Performed: Procedure(s) (LRB): HYDROCELECTOMY ADULT (Left)  Patient location during evaluation: PACU Anesthesia Type: General Level of consciousness: awake and alert Pain management: pain level controlled Vital Signs Assessment: post-procedure vital signs reviewed and stable Respiratory status: spontaneous breathing, nonlabored ventilation, respiratory function stable and patient connected to nasal cannula oxygen Cardiovascular status: blood pressure returned to baseline and stable Postop Assessment: no signs of nausea or vomiting Anesthetic complications: no    Last Vitals:  Vitals:   02/28/16 0915 02/28/16 0925  BP: (!) 114/59 101/71  Pulse: (!) 59 (!) 57  Resp: 12 12  Temp: 36.7 C 36.4 C    Last Pain:  Vitals:   02/28/16 0915  TempSrc:   PainSc: 6                  Tiajuana Amass

## 2016-02-28 NOTE — Op Note (Signed)
PATIENT:  Benjamin Robertson  PRE-OPERATIVE DIAGNOSIS: Left hydrocele  POST-OPERATIVE DIAGNOSIS:  Same  PROCEDURE:  Procedure(s): Left hydrocelectomy  SURGEON:  Claybon Jabs  INDICATION: Mr. Linenberger is a 65 year old male with a moderately large, symptomatic left hydrocele. We discussed the treatment options and he has elected to proceed with surgical management.  ANESTHESIA:  General  EBL:  Minimal  DRAINS: None  LOCAL MEDICATIONS USED:  1/4% plain Marcaine  SPECIMEN:  None  DISPOSITION OF SPECIMEN:  N/A  Description of procedure: After informed consent the patient was brought to the major OR, placed on the table and administered general anesthesia. His genitalia was then sterilely prepped and draped. An official timeout was then performed.  A midline median raphae scrotal incision was then made and carried down over the lefthydrocele. The tissue over the hydrocele was cleared using a combination of sharp and blunt technique. The hydrocele was then opened, drained of clear amber fluid and delivered through the incision. The excess hydrocele sac tissue was excised with the Bovie electrocautery and then I cauterized the edges. I then reapproximated the edges posteriorly behind the epididymis with a running, locking 3-0 chromic suture. The appendix testis was removed with the Bovie.   His testicle was then replaced in the normal anatomic position and his left hemiscrotum. I then closed the deep scrotal tissue with running 3-0 chromic suture in a locking fashion. I injected quarter percent Marcaine in the subcutaneous tissue and closed the skin with running 3-0 chromic. Neosporin, a sterile gauze dressing, fluff Kerlix and a scrotal support were applied. The patient tolerated the procedure well no intraoperative complications. Needle sponge and instrument counts were correct at the end of the operation.   PLAN OF CARE: Discharge to home after PACU  PATIENT DISPOSITION:  PACU - hemodynamically  stable.

## 2016-02-28 NOTE — Discharge Instructions (Signed)
Scrotal surgery postoperative instructions  Wound:  In most cases your incision will have absorbable sutures that will dissolve within the first 10-20 days. Some will fall out even earlier. Expect some redness as the sutures dissolved but this should occur only around the sutures. If there is generalized redness, especially with increasing pain or swelling, let us know. The scrotum will very likely get "black and blue" as the blood in the tissues spread. Sometimes the whole scrotum will turn colors. The black and blue is followed by a yellow and brown color. In time, all the discoloration will go away. In some cases some firm swelling in the area of the testicle may persist for up to 4-6 weeks after the surgery and is considered normal in most cases.  Diet:  You may return to your normal diet within 24 hours following your surgery. You may note some mild nausea and possibly vomiting the first 6-8 hours following surgery. This is usually due to the side effects of anesthesia, and will disappear quite soon. I would suggest clear liquids and a very light meal the first evening following your surgery.  Activity:  Your physical activity should be restricted the first 48 hours. During that time you should remain relatively inactive, moving about only when necessary. During the first 7-10 days following surgery he should avoid lifting any heavy objects (anything greater than 15 pounds), and avoid strenuous exercise. If you work, ask [place] specifically about your restrictions, both for work and home. We will write a note to your employer if needed.  You should plan to wear a tight pair of jockey shorts or an athletic supporter for the first 4-5 days, even to sleep. This will keep the scrotum immobilized to some degree and keep the swelling down.  Ice packs should be placed on and off over the scrotum for the first 48 hours. Frozen peas or corn in a ZipLock bag can be frozen, used and re-frozen. Fifteen minutes  on and 15 minutes off is a reasonable schedule. The ice is a good pain reliever and keeps the swelling down.  Hygiene:  You may shower 48 hours after your surgery. Tub bathing should be restricted until the seventh day.          Medication:  You will be sent home with some type of pain medication. In many cases you will be sent home with a narcotic pain pill (hydrococone or oxycodone). If the pain is not too bad, you may take either Tylenol (acetaminophen) or Advil (ibuprofen) which contain no narcotic agents, and might be tolerated a little better, with fewer side effects. If the pain medication you are sent home with does not control the pain, you will have to let us know. Some narcotic pain medications cannot be given or refilled by a phone call to a pharmacy.  Problems you should report to [place]:   Fever of 101.0 degrees Fahrenheit or greater.  Moderate or severe swelling under the skin incision or involving the scrotum.  Drug reaction such as hives, a rash, nausea or vomiting.    General Anesthesia, Adult, Care After These instructions provide you with information about caring for yourself after your procedure. Your health care provider may also give you more specific instructions. Your treatment has been planned according to current medical practices, but problems sometimes occur. Call your health care provider if you have any problems or questions after your procedure. What can I expect after the procedure? After the procedure, it is common  to have:  Vomiting.  A sore throat.  Mental slowness. It is common to feel:  Nauseous.  Cold or shivery.  Sleepy.  Tired.  Sore or achy, even in parts of your body where you did not have surgery. Follow these instructions at home: For at least 24 hours after the procedure:  Do not:  Participate in activities where you could fall or become injured.  Drive.  Use heavy machinery.  Drink alcohol.  Take sleeping pills  or medicines that cause drowsiness.  Make important decisions or sign legal documents.  Take care of children on your own.  Rest. Eating and drinking  If you vomit, drink water, juice, or soup when you can drink without vomiting.  Drink enough fluid to keep your urine clear or pale yellow.  Make sure you have little or no nausea before eating solid foods.  Follow the diet recommended by your health care provider. General instructions  Have a responsible adult stay with you until you are awake and alert.  Return to your normal activities as told by your health care provider. Ask your health care provider what activities are safe for you.  Take over-the-counter and prescription medicines only as told by your health care provider.  If you smoke, do not smoke without supervision.  Keep all follow-up visits as told by your health care provider. This is important. Contact a health care provider if:  You continue to have nausea or vomiting at home, and medicines are not helpful.  You cannot drink fluids or start eating again.  You cannot urinate after 8-12 hours.  You develop a skin rash.  You have fever.  You have increasing redness at the site of your procedure. Get help right away if:  You have difficulty breathing.  You have chest pain.  You have unexpected bleeding.  You feel that you are having a life-threatening or urgent problem. This information is not intended to replace advice given to you by your health care provider. Make sure you discuss any questions you have with your health care provider. Document Released: 06/05/2000 Document Revised: 08/02/2015 Document Reviewed: 02/11/2015 Elsevier Interactive Patient Education  2017 [employer].

## 2016-02-28 NOTE — Transfer of Care (Signed)
Immediate Anesthesia Transfer of Care Note  Patient: Benjamin Robertson  Procedure(s) Performed: Procedure(s): HYDROCELECTOMY ADULT (Left)  Patient Location: PACU  Anesthesia Type:General  Level of Consciousness: sedated  Airway & Oxygen Therapy: Patient Spontanous Breathing and Patient connected to face mask oxygen  Post-op Assessment: Report given to RN and Post -op Vital signs reviewed and stable  Post vital signs: Reviewed and stable  Last Vitals:  Vitals:   02/28/16 0552  BP: (!) 112/56  Pulse: (!) 56  Resp: 16  Temp: 36.5 C    Last Pain:  Vitals:   02/28/16 0552  TempSrc: Oral         Complications: No apparent anesthesia complications

## 2016-03-22 ENCOUNTER — Other Ambulatory Visit: Payer: Self-pay | Admitting: Cardiology

## 2016-03-22 NOTE — Telephone Encounter (Signed)
Rx(s) sent to pharmacy electronically.  

## 2016-04-26 ENCOUNTER — Encounter: Payer: Self-pay | Admitting: Cardiology

## 2016-04-26 ENCOUNTER — Ambulatory Visit (INDEPENDENT_AMBULATORY_CARE_PROVIDER_SITE_OTHER): Payer: BLUE CROSS/BLUE SHIELD | Admitting: Cardiology

## 2016-04-26 VITALS — BP 140/82 | HR 76 | Ht 68.0 in | Wt 218.0 lb

## 2016-04-26 DIAGNOSIS — I255 Ischemic cardiomyopathy: Secondary | ICD-10-CM | POA: Diagnosis not present

## 2016-04-26 DIAGNOSIS — E66811 Obesity, class 1: Secondary | ICD-10-CM

## 2016-04-26 DIAGNOSIS — I2119 ST elevation (STEMI) myocardial infarction involving other coronary artery of inferior wall: Secondary | ICD-10-CM | POA: Diagnosis not present

## 2016-04-26 DIAGNOSIS — E785 Hyperlipidemia, unspecified: Secondary | ICD-10-CM | POA: Diagnosis not present

## 2016-04-26 DIAGNOSIS — I2510 Atherosclerotic heart disease of native coronary artery without angina pectoris: Secondary | ICD-10-CM

## 2016-04-26 DIAGNOSIS — F17210 Nicotine dependence, cigarettes, uncomplicated: Secondary | ICD-10-CM

## 2016-04-26 DIAGNOSIS — Z9861 Coronary angioplasty status: Secondary | ICD-10-CM

## 2016-04-26 DIAGNOSIS — E669 Obesity, unspecified: Secondary | ICD-10-CM

## 2016-04-26 MED ORDER — NITROGLYCERIN 0.4 MG/SPRAY TL SOLN: 1.0000 | 2 refills | Status: DC | PRN

## 2016-04-26 MED ORDER — PROAIR HFA 108 (90 BASE) MCG/ACT IN AERS: INHALATION_SPRAY | RESPIRATORY_TRACT | 2 refills | Status: DC

## 2016-04-26 MED ORDER — METOPROLOL TARTRATE 50 MG PO TABS: ORAL_TABLET | ORAL | 3 refills | Status: DC

## 2016-04-26 MED ORDER — SIMVASTATIN 40 MG PO TABS: ORAL_TABLET | ORAL | 3 refills | Status: DC

## 2016-04-26 MED ORDER — RAMIPRIL 5 MG PO CAPS: 5.0000 mg | ORAL_CAPSULE | Freq: Every day | ORAL | 3 refills | Status: DC

## 2016-04-26 NOTE — Patient Instructions (Signed)
NO CHANGE WITH CURRENT TREATMENT OR MEDICATIONS    Your physician wants you to follow-up in 12 MONTHS WITH DR HARDING. You will receive a reminder letter in the mail two months in advance. If you don't receive a letter, please call our office to schedule the follow-up appointment.      If you need a refill on your cardiac medications before your next appointment, please call your pharmacy.  

## 2016-04-26 NOTE — Progress Notes (Signed)
PCP: No PCP Per Patient  Clinic Note: Chief Complaint  Patient presents with  . Follow-up    6 months-  CAD-CABG and PCI  . Shortness of Breath    Reactive airways disease following recent bout of pneumonia    HPI: Benjamin Robertson is a 66 y.o. male with a PMH below who presents today for 8 month f/u For CAD-CABG followed by PCI in 2016 NSTEMI in May 2016  - PCI to Ostial Cx (not bypassed) - Xience DES 3.0 x 15 (3.6 mm)  Benjamin Robertson was last seen in June 2017  Recent Hospitalizations: Had a hydrocele back in December 2017  Studies Reviewed: n/a  He plans to retire in July of this year.  Interval History: Benjamin Robertson presents today pretty good spirits. He says he is getting close to retirement and quite happy about it. He has his mild baseline dyspnea and has been having issues with was probably mild COPD was reactive airways disease component - this started after he had a bout of pneumonia late last year.  Symptoms are better with an inhaler, but he is run out of it now. Coronary symptoms he has not had any anginal symptoms with rest or exertion since 2015. No resting dyspnea. No PND, orthopnea or edema. He staying active, but is not doing routine exercise. Still pretty busy with work.   No palpitations, lightheadedness, dizziness, weakness or syncope/near syncope. No TIA/amaurosis fugax symptoms. No melena, hematochezia, hematuria, or epstaxis. No claudication.  ROS: A comprehensive was performed. Review of Systems  Constitutional: Negative for malaise/fatigue and weight loss.  HENT: Negative for congestion.   Respiratory: Positive for cough and wheezing.   Musculoskeletal: Positive for joint pain (Bilateral knee).  Endo/Heme/Allergies: Does not bruise/bleed easily.  Psychiatric/Behavioral: Negative for memory loss. The patient is not nervous/anxious and does not have insomnia.   All other systems reviewed and are negative.   Past Medical History:  Diagnosis Date  . Arthritis      "qwhere" (07/30/2014)  . CAD in native artery 1994, 2001   a) 2001 Exertional Angina --> Myoview: Inf infarct and Ant ischemia --> Cath: 95% pLAD, 99% D1, 80% RI, but Cx-OM1&2 OK , & patent RCA stent --> CABG: b) Cath in 2003 patent grafts, RCA & Cx; c) 08/2009 Myoview: Mod Basal-Mid inferoseptal, Basal-apical inferior Infarct. No ischemia.; d) May 2016: PCI to Ostial Cx (not bypassed) - Xience DES 3.0 x 15 (3.6 mm)  . Childhood asthma   . History of kidney stones   . History of ST elevation myocardial infarction (STEMI) of inferior wall 1994; 1997   PCI of RCA - redo PCI BMS ML Vision 3.0 mm x 25 mm to RCA in 1997  . Hyperlipidemia LDL goal <70    At goal by Most recent labs October 2013: TC 123, TG 83, HDL 81, LDL 65  . Hypertension   . Ischemic cardiomyopathy 1994, 2003   Echo February 2014: EF 40-45%; moderate HK of anterolateral myocardium.; Grade 1 diastolic dysfunction  . Kidney stones   . Obesity (BMI 30.0-34.9)   . Pneumonia 2014  . S/P CABG x 3 2001   LIMA-LAD, SVG-diagonal, SVG-Ramus Intermedius (RI) - patent by Cath in 2003    Past Surgical History:  Procedure Laterality Date  . CARDIAC CATHETERIZATION  06/06/1999   Recommend CABG  . CARDIAC CATHETERIZATION  10/04/2001   Patent grafts with a 50% lesion in LAD beyond IMA insertion  . CARDIAC CATHETERIZATION N/A 07/31/2014  Procedure: Left Heart Cath and Cors/Grafts Angiography;  Surgeon: Jettie Booze, MD;  Location: [facility];  Service: Cardiovascular;  Laterality: N/A;  . CARDIAC CATHETERIZATION N/A 07/31/2014   Procedure: Coronary Stent Intervention;  Surgeon: Jettie Booze, MD;  Location: [facility];  Service: Cardiovascular: PCI to Ostial Cx (not bypassed) - Xience DES 3.0 x 15 (3.6 mm)  . CAROTID DOPPLER  05/06/2012   Less than 50% diameter reduction of the Lft Subclavian.  . CORONARY ANGIOPLASTY WITH STENT PLACEMENT  01/21/1996   PTCA of RCA-95% lesion  . CORONARY ARTERY BYPASS GRAFT   06/07/1999   x3. LIMA to distal LAD, SVG to first diag, and SVG to ramus  . HYDROCELE EXCISION Left 02/28/2016   Procedure: HYDROCELECTOMY ADULT;  Surgeon: Kathie Rhodes, MD;  Location: [facility];  Service: Urology;  Laterality: Left;  . LITHOTRIPSY  "2-3 times"  . NM MYOVIEW LTD  08/19/2009   Moderafte perfusion seen in Basal inferoseptal, Basal inferior, Mid inferoseptal, Mid inferiorm, and Apical inferior regions. No ECG changes. EKG negative for ischemia.  . TRANSTHORACIC ECHOCARDIOGRAM  05/06/2012   EF 40-45%, LV cavity moderately dilated, systolic function mild-moderately reduced, moderate hypokinesis of anterolatewral myocardium.    Current Meds  Medication Sig  . ASPIRIN 81 PO Take 81 mg by mouth daily.  . metoprolol (LOPRESSOR) 50 MG tablet TAKE 1/2 TABLET BY MOUTH 2 TIMES A DAY  . nitroGLYCERIN (NITROLINGUAL) 0.4 MG/SPRAY spray Place 1 spray under the tongue every 5 (five) minutes x 3 doses as needed for chest pain.  . ramipril (ALTACE) 5 MG capsule Take 1 capsule (5 mg total) by mouth daily.  . simvastatin (ZOCOR) 40 MG tablet TAKE 1 TABLET (40 MG TOTAL) BY MOUTH AT BEDTIME.  . [DISCONTINUED] metoprolol (LOPRESSOR) 50 MG tablet TAKE 1/2 TABLET BY MOUTH 2 TIMES A DAY  . [DISCONTINUED] nitroGLYCERIN (NITROLINGUAL) 0.4 MG/SPRAY spray Place 1 spray under the tongue every 5 (five) minutes x 3 doses as needed for chest pain.  . [DISCONTINUED] ramipril (ALTACE) 5 MG capsule TAKE ONE CAPSULE EVERY DAY  . [DISCONTINUED] simvastatin (ZOCOR) 40 MG tablet TAKE 1 TABLET (40 MG TOTAL) BY MOUTH AT BEDTIME.    Allergies  Allergen Reactions  . Brilinta [Ticagrelor] Shortness Of Breath    Having breathing issues  . Advicor [Niacin-Lovastatin Er] Other (See Comments)    Headache, possibly other reactions  . Lipitor [Atorvastatin] Other (See Comments)    Headache, possibly other reactions  . Pravachol [Pravastatin Sodium] Other (See Comments)    Headache, possibly other reactions    Social History    Social History  . Marital status: Married    Spouse name: N/A  . Number of children: N/A  . Years of education: N/A   Social History Main Topics  . Smoking status: Current Every Day Smoker    Packs/day: 0.50    Years: 48.00    Types: Cigarettes  . Smokeless tobacco: Never Used  . Alcohol use No  . Drug use: No  . Sexual activity: No   Other Topics Concern  . None   Social History Narrative   Married father of 2 with one grandchild. By his wife.   Continues to smoke one half pack a day.   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   Very physically active at work but no routine exercise.    family history includes Heart attack in his mother.  Wt Readings from Last 3 Encounters:  04/26/16 98.9 kg (218 lb)  02/28/16 96.2 kg (212 lb)  02/22/16 96.2 kg (212 lb)    PHYSICAL EXAM BP 140/82   Pulse 76   Ht 5\' 8"  (1.727 m)   Wt 98.9 kg (218 lb)   BMI 33.15 kg/m  General appearance: alert, cooperative, appears stated age, no distress and mildly obese; he has his usual ruddy complexion. Seems frustrated. HEENT: [place]/AT, EOMI, MMM, anicteric sclera Neck: no adenopathy, no carotid bruit and no JVD Lungs: clear to auscultation bilaterally, normal percussion bilaterally and non-labored Heart: regular rate and rhythm, S1&S2 normal, no murmur, click, rub or gallops; Non-displaced PMI Abdomen: soft, non-tender; bowel sounds normal; no masses, no organomegaly; No HJR Extremities: extremities normal, atraumatic, no cyanosis, or edema  Pulses: 2+ and symmetric; Skin: normal and no lesions noted or jaundice noted Neurologic: Mental status: Alert, oriented, thought content appropriate    Adult ECG Report  Rate: 76 ;  Rhythm: normal sinus rhythm, premature ventricular contractions (PVC) and Left atrial enlargement.; otherwise normal axis, intervals and durations.  Narrative Interpretation: Stable EKG   Other studies  Reviewed: Additional studies/ records that were reviewed today include:  Recent Labs:  Lab Results  Component Value Date   CHOL 121 (L) 06/19/2015   HDL 36 (L) 06/19/2015   LDLCALC 69 06/19/2015   TRIG 81 06/19/2015   CHOLHDL 3.4 06/19/2015   Lab Results  Component Value Date   CREATININE 1.00 02/22/2016   BUN 18 02/22/2016   NA 137 02/22/2016   K 4.7 02/22/2016   CL 105 02/22/2016   CO2 25 02/22/2016    ASSESSMENT / PLAN: Problem List Items Addressed This Visit    CAD in native artery - status post CABG x3 after PCI x2 to the RCA (LIMA-LAD, SVG-diagonal SVG-RI (Chronic)    Disease in the native circumflex treated with a stent during his last cath. Otherwise grafts appear to be patent. It was the circumflex was not grafted prior to this. He is no longer on Effient. He is on aspirin, beta blocker, ACE inhibitor and statin.  No change for now.      Relevant Medications   ASPIRIN 81 PO   simvastatin (ZOCOR) 40 MG tablet   ramipril (ALTACE) 5 MG capsule   metoprolol (LOPRESSOR) 50 MG tablet   nitroGLYCERIN (NITROLINGUAL) 0.4 MG/SPRAY spray   CAD S/P PCI - DES PCI to prox Cx XIENCE ALPINE RX 3.0X15  (Chronic)    He specifically asked to stop antiplatelet agents because of bleeding issues. He is now simply on aspirin. Talked about potential risk of stent thrombosis, but he is now a year and a half out and doing quite well.      Relevant Medications   ASPIRIN 81 PO   simvastatin (ZOCOR) 40 MG tablet   ramipril (ALTACE) 5 MG capsule   metoprolol (LOPRESSOR) 50 MG tablet   nitroGLYCERIN (NITROLINGUAL) 0.4 MG/SPRAY spray   Cigarette smoker one half pack a day or less (Chronic)    Continues to try to scale down his smoking, but is still not ready to stop. We briefly talked about it, but he was not yet ready to quit.      History of ST elevation myocardial infarction (STEMI) of inferior wall (Chronic)    Persistent scar noted on Myoview and echocardiogram. No signs of heart  failure or angina.      Relevant Medications   ASPIRIN 81 PO   simvastatin (ZOCOR) 40 MG  tablet   ramipril (ALTACE) 5 MG capsule   metoprolol (LOPRESSOR) 50 MG tablet   nitroGLYCERIN (NITROLINGUAL) 0.4 MG/SPRAY spray   Hyperlipidemia with target LDL less than 70 (Chronic)    Pretty much at goal with statin. At this point will simply continue to monitor.      Relevant Medications   ASPIRIN 81 PO   simvastatin (ZOCOR) 40 MG tablet   ramipril (ALTACE) 5 MG capsule   metoprolol (LOPRESSOR) 50 MG tablet   nitroGLYCERIN (NITROLINGUAL) 0.4 MG/SPRAY spray   Ischemic cardiomyopathy (Chronic)    Despite having a EF of 40-45%. He really doesn't have that much the way of heart failure symptoms. As expected there is inferior hypokinesis with his prior inferior infarct. Currently on stable medications with no diuretic requirement.      Relevant Medications   ASPIRIN 81 PO   simvastatin (ZOCOR) 40 MG tablet   ramipril (ALTACE) 5 MG capsule   metoprolol (LOPRESSOR) 50 MG tablet   nitroGLYCERIN (NITROLINGUAL) 0.4 MG/SPRAY spray   Obesity (BMI 30.0-34.9) (Chronic)    The patient understands the need to lose weight with diet and exercise. We have discussed specific strategies for this.         Current medicines are reviewed at length with the patient today. (+/- concerns) asked about Inhaler (for post PNA reactive airway disease - PRN) The following changes have been made: see below  Patient Instructions  NO CHANGE WITH CURRENT TREATMENT OR  MEDICATIONS    Your physician wants you to follow-up in [place].You will receive a reminder letter in the mail two months in advance. If you don't receive a letter, please call our office to schedule the follow-up appointment.   If you need a refill on your cardiac medications before your next appointment, please call your pharmacy.   Studies Ordered:   No orders of the defined types were placed in this  encounter.     Glenetta Hew, M.D., M.S. Interventional Cardiologist   Pager # [phone] Phone # [phone] [address] [place], [place] [address]

## 2016-04-28 ENCOUNTER — Encounter: Payer: Self-pay | Admitting: Cardiology

## 2016-04-28 NOTE — Assessment & Plan Note (Signed)
Persistent scar noted on Myoview and echocardiogram. No signs of heart failure or angina.

## 2016-04-28 NOTE — Assessment & Plan Note (Signed)
Continues to try to scale down his smoking, but is still not ready to stop. We briefly talked about it, but he was not yet ready to quit.

## 2016-04-28 NOTE — Assessment & Plan Note (Signed)
The patient understands the need to lose weight with diet and exercise. We have discussed specific strategies for this.  

## 2016-04-28 NOTE — Assessment & Plan Note (Signed)
He specifically asked to stop antiplatelet agents because of bleeding issues. He is now simply on aspirin. Talked about potential risk of stent thrombosis, but he is now a year and a half out and doing quite well.

## 2016-04-28 NOTE — Assessment & Plan Note (Signed)
Pretty much at goal with statin. At this point will simply continue to monitor.

## 2016-04-28 NOTE — Assessment & Plan Note (Signed)
Despite having a EF of 40-45%. He really doesn't have that much the way of heart failure symptoms. As expected there is inferior hypokinesis with his prior inferior infarct. Currently on stable medications with no diuretic requirement.

## 2016-04-28 NOTE — Assessment & Plan Note (Signed)
Disease in the native circumflex treated with a stent during his last cath. Otherwise grafts appear to be patent. It was the circumflex was not grafted prior to this. He is no longer on Effient. He is on aspirin, beta blocker, ACE inhibitor and statin.  No change for now.

## 2016-05-01 NOTE — Addendum Note (Signed)
Addended by: Venetia Maxon on: 05/01/2016 08:36 AM   Modules accepted: Orders

## 2016-06-14 ENCOUNTER — Other Ambulatory Visit: Payer: Self-pay | Admitting: Cardiology

## 2016-06-14 NOTE — Telephone Encounter (Signed)
Rx(s) sent to pharmacy electronically.  

## 2016-08-30 ENCOUNTER — Telehealth: Payer: Self-pay | Admitting: Cardiology

## 2016-08-30 MED ORDER — RAMIPRIL 5 MG PO CAPS: 5.0000 mg | ORAL_CAPSULE | Freq: Every day | ORAL | 3 refills | Status: DC

## 2016-08-30 NOTE — Telephone Encounter (Signed)
New message    Pt wife is calling.    *STAT* If patient is at the pharmacy, call can be transferred to refill team.   1. Which medications need to be refilled? (please list name of each medication and dose if known) ramipril 5 mg  2. Which pharmacy/location (including street and city if local pharmacy) is medication to be sent to? [facility]  3. Do they need a 30 day or 90 day supply? 90 day

## 2016-08-30 NOTE — Telephone Encounter (Signed)
Refill sent to the pharmacy electronically.  

## 2016-09-13 ENCOUNTER — Other Ambulatory Visit: Payer: Self-pay | Admitting: Cardiology

## 2016-09-27 ENCOUNTER — Other Ambulatory Visit: Payer: Self-pay

## 2016-09-27 MED ORDER — RAMIPRIL 5 MG PO CAPS: 5.0000 mg | ORAL_CAPSULE | Freq: Every day | ORAL | 3 refills | Status: DC

## 2017-04-18 ENCOUNTER — Other Ambulatory Visit: Payer: Self-pay | Admitting: Cardiology

## 2017-04-26 ENCOUNTER — Encounter: Payer: Self-pay | Admitting: Cardiology

## 2017-04-26 ENCOUNTER — Ambulatory Visit (INDEPENDENT_AMBULATORY_CARE_PROVIDER_SITE_OTHER): Payer: Medicare Other | Admitting: Cardiology

## 2017-04-26 VITALS — BP 152/79 | HR 64 | Ht 68.0 in | Wt 212.4 lb

## 2017-04-26 DIAGNOSIS — I10 Essential (primary) hypertension: Secondary | ICD-10-CM | POA: Diagnosis not present

## 2017-04-26 DIAGNOSIS — E785 Hyperlipidemia, unspecified: Secondary | ICD-10-CM | POA: Diagnosis not present

## 2017-04-26 DIAGNOSIS — I255 Ischemic cardiomyopathy: Secondary | ICD-10-CM | POA: Diagnosis not present

## 2017-04-26 DIAGNOSIS — F17210 Nicotine dependence, cigarettes, uncomplicated: Secondary | ICD-10-CM

## 2017-04-26 DIAGNOSIS — I2119 ST elevation (STEMI) myocardial infarction involving other coronary artery of inferior wall: Secondary | ICD-10-CM | POA: Diagnosis not present

## 2017-04-26 DIAGNOSIS — J4520 Mild intermittent asthma, uncomplicated: Secondary | ICD-10-CM

## 2017-04-26 DIAGNOSIS — Z9861 Coronary angioplasty status: Secondary | ICD-10-CM

## 2017-04-26 DIAGNOSIS — E669 Obesity, unspecified: Secondary | ICD-10-CM

## 2017-04-26 DIAGNOSIS — R7309 Other abnormal glucose: Secondary | ICD-10-CM | POA: Diagnosis not present

## 2017-04-26 DIAGNOSIS — E66811 Obesity, class 1: Secondary | ICD-10-CM

## 2017-04-26 DIAGNOSIS — I2510 Atherosclerotic heart disease of native coronary artery without angina pectoris: Secondary | ICD-10-CM | POA: Diagnosis not present

## 2017-04-26 MED ORDER — RAMIPRIL 10 MG PO CAPS: 10.0000 mg | ORAL_CAPSULE | Freq: Every day | ORAL | 3 refills | Status: DC

## 2017-04-26 MED ORDER — METOPROLOL TARTRATE 50 MG PO TABS: ORAL_TABLET | ORAL | 3 refills | Status: DC

## 2017-04-26 MED ORDER — ALBUTEROL SULFATE HFA 108 (90 BASE) MCG/ACT IN AERS: INHALATION_SPRAY | RESPIRATORY_TRACT | 6 refills | Status: DC

## 2017-04-26 NOTE — Progress Notes (Signed)
PCP: Patient, No Pcp Per  Clinic Note: Chief Complaint  Patient presents with  . Follow-up    One episode of chest pain  . Coronary Artery Disease    HPI: Benjamin Robertson is a 67 y.o. male with a PMH below who presents today for annual follow-up for CAD-CABG (2001) followed by PCI in 2016.  NSTEMI in May 2016 - PCI to Ostial Cx (not bypassed) - Xience DES 3.0 x 15 (3.6 mm)  Benjamin Robertson was last seen in February 2018.  Was doing well in good spirits.  Close to retirement.  Mild exertional dyspnea because of COPD recent bout of pneumonia.  No cardiac symptoms.  Staying active but not routine exercise.  Recent Hospitalizations: n/a  Studies Personally Reviewed - (if available, images/films reviewed: From Epic Chart or Care Everywhere)  n/a  Interval History: [name] returns today actually noticing that for the most part he been doing fairly well.  However this past weekend he was helping his son carry a couch into the house.  It was cold and the cath was heavy.  He was moving a couch and he noted having central chest discomfort described as a heaviness and tightness with shortness of breath.  Once he put the couch down and caught his breath he took a nitroglycerin and the symptoms totally went away.  He has not had any further episodes since and no episodes other wife's in the last year.  That is the first time he is use nitroglycerin in a long time.  That week and he was also recovering from a upper respiratory tract infection with nasal congestion and sinuses.  We have been coughing quite a bit as well.  He otherwise denies any recurrent symptoms with his routine amount of exertion.  No heart failure symptoms of PND, orthopnea or edema.  He does have some baseline exertional dyspnea which he is always had, but nothing that he considers worrisome.  He had some palpitations with the chest discomfort currently, but none since.   Cardiovascular ROS: positive for - chest pain, dyspnea on exertion  and No further symptoms since the episode 2 weeks ago while carrying a couch.  This was also associated with palpitations but no longer present. negative for - edema, irregular heartbeat, orthopnea, palpitations, paroxysmal nocturnal dyspnea, rapid heart rate or TIA/amaurosis fugax, syncope/near syncope, No claudication.  ROS: A comprehensive was performed. Review of Systems  Constitutional: Negative for chills, fever and malaise/fatigue.  HENT: Positive for congestion. Negative for nosebleeds.   Respiratory: Positive for cough, shortness of breath and wheezing.        Smoker's cough.  Also recovering from bout of pneumonia in the fall, and recent URI  Gastrointestinal: Negative for blood in stool and melena.  Musculoskeletal: Positive for joint pain (Bilateral knee).  Neurological: Negative for dizziness.  Psychiatric/Behavioral: Negative.  The patient does not have insomnia.   All other systems reviewed and are negative.  I have reviewed and (if needed) personally updated the patient's problem list, medications, allergies, past medical and surgical history, social and family history.   Past Medical History:  Diagnosis Date  . Arthritis    "qwhere" (07/30/2014)  . CAD in native artery 1994, 2001   a) 2001 Exertional Angina --> Myoview: Inf infarct and Ant ischemia --> Cath: 95% pLAD, 99% D1, 80% RI, but Cx-OM1&2 OK , & patent RCA stent --> CABG: b) Cath in 2003 patent grafts, RCA & Cx; c) 08/2009 Myoview: Mod Basal-Mid inferoseptal,  Basal-apical inferior Infarct. No ischemia.; d) May 2016: PCI to Ostial Cx (not bypassed) - Xience DES 3.0 x 15 (3.6 mm)  . Childhood asthma   . History of kidney stones   . History of ST elevation myocardial infarction (STEMI) of inferior wall 1994; 1997   PCI of RCA - redo PCI BMS ML Vision 3.0 mm x 25 mm to RCA in 1997  . Hyperlipidemia LDL goal <70    At goal by Most recent labs October 2013: TC 123, TG 83, HDL 81, LDL 65  . Hypertension   . Ischemic  cardiomyopathy 1994, 2003   Echo February 2014: EF 40-45%; moderate HK of anterolateral myocardium.; Grade 1 diastolic dysfunction  . Kidney stones   . Obesity (BMI 30.0-34.9)   . Pneumonia 2014  . S/P CABG x 3 2001   LIMA-LAD, SVG-diagonal, SVG-Ramus Intermedius (RI) - patent by Cath in 2003    Past Surgical History:  Procedure Laterality Date  . CARDIAC CATHETERIZATION  06/06/1999   Recommend CABG  . CARDIAC CATHETERIZATION  10/04/2001   Patent grafts with a 50% lesion in LAD beyond IMA insertion  . CARDIAC CATHETERIZATION N/A 07/31/2014   Procedure: Left Heart Cath and Cors/Grafts Angiography;  Surgeon: Jettie Booze, MD;  Location: [facility];  Service: Cardiovascular;  Laterality: N/A;  . CARDIAC CATHETERIZATION N/A 07/31/2014   Procedure: Coronary Stent Intervention;  Surgeon: Jettie Booze, MD;  Location: [facility];  Service: Cardiovascular: PCI to Ostial Cx (not bypassed) - Xience DES 3.0 x 15 (3.6 mm)  . CAROTID DOPPLER  05/06/2012   Less than 50% diameter reduction of the Lft Subclavian.  . CORONARY ANGIOPLASTY WITH STENT PLACEMENT  01/21/1996   PTCA of RCA-95% lesion  . CORONARY ARTERY BYPASS GRAFT  06/07/1999   x3. LIMA to distal LAD, SVG to first diag, and SVG to ramus  . HYDROCELE EXCISION Left 02/28/2016   Procedure: HYDROCELECTOMY ADULT;  Surgeon: Kathie Rhodes, MD;  Location: [facility];  Service: Urology;  Laterality: Left;  . LITHOTRIPSY  "2-3 times"  . NM MYOVIEW LTD  08/19/2009   Moderafte perfusion seen in Basal inferoseptal, Basal inferior, Mid inferoseptal, Mid inferiorm, and Apical inferior regions. No ECG changes. EKG negative for ischemia.  . TRANSTHORACIC ECHOCARDIOGRAM  05/06/2012   EF 40-45%, LV cavity moderately dilated, systolic function mild-moderately reduced, moderate hypokinesis of anterolatewral myocardium.    Current Meds  Medication Sig  . albuterol (PROAIR HFA) 108 (90 Base) MCG/ACT inhaler USE 1 TO 2 PUFFS EVERY 4 HOURS AS  NEEDED FOR SHORTNESS OF BREATH  . ASPIRIN 81 PO Take 81 mg by mouth daily.  . metoprolol tartrate (LOPRESSOR) 50 MG tablet TAKE 1/2 TABLET BY MOUTH 2 TIMES A DAY  . nitroGLYCERIN (NITROLINGUAL) 0.4 MG/SPRAY spray Place 1 spray under the tongue every 5 (five) minutes x 3 doses as needed for chest pain.  . simvastatin (ZOCOR) 40 MG tablet TAKE 1 TABLET (40 MG TOTAL) BY MOUTH AT BEDTIME.  . [DISCONTINUED] metoprolol tartrate (LOPRESSOR) 50 MG tablet TAKE 1/2 TABLET BY MOUTH 2  TIMES A DAY  . [DISCONTINUED] PROAIR HFA 108 (90 Base) MCG/ACT inhaler USE 1 TO 2 PUFFS EVERY 4 HOURS AS NEEDED FOR SHORTNESS OF BREATH  . [DISCONTINUED] ramipril (ALTACE) 5 MG capsule Take 1 capsule (5 mg total) by mouth daily.    Allergies  Allergen Reactions  . Brilinta [Ticagrelor] Shortness Of Breath    Having breathing issues  . Advicor [Niacin-Lovastatin Er] Other (See  Comments)    Headache, possibly other reactions  . Lipitor [Atorvastatin] Other (See Comments)    Headache, possibly other reactions  . Pravachol [Pravastatin Sodium] Other (See Comments)    Headache, possibly other reactions    Social History   Tobacco Use  . Smoking status: Current Every Day Smoker    Packs/day: 0.50    Years: 48.00    Pack years: 24.00    Types: Cigarettes  . Smokeless tobacco: Never Used  Substance Use Topics  . Alcohol use: No  . Drug use: No   Social History   Social History Narrative   Married father of 2 with one grandchild. By his wife.   Continues to smoke one half pack a day.   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   Very physically active at work but no routine exercise.    family history includes Heart attack in his mother.  Wt Readings from Last 3 Encounters:  04/26/17 212 lb 6.4 oz (96.3 kg)  04/26/16 218 lb (98.9 kg)  02/28/16 212 lb (96.2 kg)    PHYSICAL EXAM BP (!) 152/79   Pulse 64   Ht 5\' 8"  (1.727 m)   Wt 212 lb 6.4  oz (96.3 kg)   BMI 32.30 kg/m  Physical Exam  Constitutional: He is oriented to person, place, and time. He appears well-developed and well-nourished. No distress.  Mildly obese.  Well-groomed  HENT:  Head: Normocephalic and atraumatic.  Mouth/Throat: No oropharyngeal exudate.  Neck: No hepatojugular reflux and no JVD present. Carotid bruit is not present. No thyromegaly present.  Cardiovascular: Normal rate, regular rhythm, normal heart sounds, intact distal pulses and normal pulses.  No extrasystoles are present. PMI is not displaced. Exam reveals no gallop and no friction rub.  No murmur heard. Pulmonary/Chest: Effort normal and breath sounds normal. No respiratory distress. He exhibits no tenderness.  Mild diffuse interstitial sounds, but no rales rhonchi or wheezing.  Abdominal: Soft. Bowel sounds are normal. He exhibits no distension. There is no tenderness.  Musculoskeletal: Normal range of motion. He exhibits no edema (Trace).  Lymphadenopathy:    He has no cervical adenopathy.  Neurological: He is alert and oriented to person, place, and time.  Skin:  He has a generalized ready complexion, but no real change from baseline.  Psychiatric: He has a normal mood and affect. His behavior is normal. Judgment and thought content normal.    Adult ECG Report  Rate: 64 ;  Rhythm: normal sinus rhythm and Normal axis, intervals and durations;   Narrative Interpretation: Normal EKG   Other studies Reviewed: Additional studies/ records that were reviewed today include:  Recent Labs:  Due for labs ordered today Lab Results  Component Value Date   CHOL 234 (H) 04/26/2017   HDL 38 (L) 04/26/2017   LDLCALC 150 (H) 04/26/2017   TRIG 230 (H) 04/26/2017   CHOLHDL 6.2 (H) 04/26/2017  --He indicates that he really has not been taking his simvastatin.  ASSESSMENT / PLAN:  Problem List Items Addressed This Visit    CAD in native artery - status post CABG x3 after PCI x2 to the RCA  (LIMA-LAD, SVG-diagonal SVG-RI - Primary (Chronic)    He much has patent grafts with left main into circumflex stenting at this point.  No longer on Eliquis.  On aspirin, statin, beta-blocker and ACE inhibitor.  One episode of chest pain relieved with nitroglycerin.  We will  monitor this closely to see if there is any more episodes.  Could very well of been related to hypertension.  Treating accordingly.      Relevant Medications   ramipril (ALTACE) 10 MG capsule   metoprolol tartrate (LOPRESSOR) 50 MG tablet   Other Relevant Orders   EKG 12-Lead (Completed)   Comprehensive metabolic panel (Completed)   CAD S/P PCI - DES PCI to prox Cx XIENCE ALPINE RX 3.0X15  (Chronic)    He specifically asked to stop his Effient during the last visit.  Has been doing well with only one episode of chest pain in the entire year.  Probably okay to continue without dual anti-platelet therapy.      Relevant Medications   ramipril (ALTACE) 10 MG capsule   metoprolol tartrate (LOPRESSOR) 50 MG tablet   Other Relevant Orders   EKG 12-Lead (Completed)   Cigarette smoker one half pack a day or less (Chronic)    Was not actually interested in talking about smoking cessation.  We discussed it briefly, but not prolonged.  Not yet ready to quit.      Essential hypertension (Chronic)    Not as well controlled today.  Initial plan will be to increase ACE inhibitor to 10 mg daily.  We will need to check the present chemistry panel today.      Relevant Medications   ramipril (ALTACE) 10 MG capsule   metoprolol tartrate (LOPRESSOR) 50 MG tablet   History of ST elevation myocardial infarction (STEMI) of inferior wall (Chronic)    Persistent scar noted on both Myoview and echo.  Thankfully, no heart failure symptoms, and only one episode to suggest angina..      Relevant Medications   ramipril (ALTACE) 10 MG capsule   metoprolol tartrate (LOPRESSOR) 50 MG tablet   Other Relevant Orders   EKG 12-Lead (Completed)     Comprehensive metabolic panel (Completed)   Hyperlipidemia with target LDL less than 70 (Chronic)    He had been doing fairly well with labs up until this recent check today.  Apparently he has not been taking his statin.  We need to clarify and ensure that he is or is not is taking it, then we will switch him to a more potent statin, if he has not.  We will recheck in 3 months after having him restart.      Relevant Medications   ramipril (ALTACE) 10 MG capsule   metoprolol tartrate (LOPRESSOR) 50 MG tablet   Other Relevant Orders   Lipid panel (Completed)   Ischemic cardiomyopathy (Chronic)    EF of 40-45% by echo and Myoview.  Thankfully, no active heart failure symptoms.  Seems euvolemic.      Relevant Medications   ramipril (ALTACE) 10 MG capsule   metoprolol tartrate (LOPRESSOR) 50 MG tablet   Other Relevant Orders   EKG 12-Lead (Completed)   Comprehensive metabolic panel (Completed)   Hemoglobin A1c (Completed)   Mild reactive airways disease (Chronic)    His respiratory symptoms have notably improved since I gave him a prescription for an inhaler.  We will refill it again until he is able to establish a new PCP.      Relevant Orders   Comprehensive metabolic panel (Completed)   Obesity (BMI 30.0-34.9) (Chronic)    He actually did lose the weight back that he gained the previous year.  Now hoping to try to get down the closer to 200 pounds by the time I see him back.  Relevant Orders   Hemoglobin A1c (Completed)    Other Visit Diagnoses    Elevated glucose       Relevant Orders   Hemoglobin A1c (Completed)      Current medicines are reviewed at length with the patient today. (+/- concerns) none The following changes have been made: none  Patient Instructions  MEDICATIONS  INCREASE RAMIPRIL TO 10 MG DAILY.    LABS  CMP LIPID    IF YOU HAVE ANOTHER  CHEST PAIN EPISODES  -TAKE  NITROGLYCERIN TABLETS AND 1/2 TABLET OF METOPROLOL -CALL OFFICE FOR  APPT.   Your physician wants you to follow-up in [place]. You will receive a reminder letter in the mail two months in advance. If you don't receive a letter, please call our office to schedule the follow-up appointment.    Ma Rings    Studies Ordered:   Orders Placed This Encounter  Procedures  . Lipid panel  . Comprehensive metabolic panel  . Hemoglobin A1c  . EKG 12-Lead      Glenetta Hew, M.D., M.S. Interventional Cardiologist   Pager # [phone] Phone # [phone] [address] [place], [place] [address]   Thank you for choosing [facility] at [facility]!!

## 2017-04-26 NOTE — Patient Instructions (Signed)
MEDICATIONS  INCREASE RAMIPRIL TO 10 MG DAILY.    LABS  CMP LIPID    IF YOU HAVE ANOTHER  CHEST PAIN EPISODES  -TAKE  NITROGLYCERIN TABLETS AND 1/2 TABLET OF METOPROLOL -CALL OFFICE FOR APPT.   Your physician wants you to follow-up in [place]. You will receive a reminder letter in the mail two months in advance. If you don't receive a letter, please call our office to schedule the follow-up appointment.    Ma Rings

## 2017-04-27 ENCOUNTER — Telehealth: Payer: Self-pay | Admitting: *Deleted

## 2017-04-27 LAB — LIPID PANEL
CHOL/HDL RATIO: 6.2 ratio — AB (ref 0.0–5.0)
Cholesterol, Total: 234 mg/dL — ABNORMAL HIGH (ref 100–199)
HDL: 38 mg/dL — AB (ref >39)
LDL Calculated: 150 mg/dL — ABNORMAL HIGH (ref 0–99)
Triglycerides: 230 mg/dL — ABNORMAL HIGH (ref 0–149)
VLDL Cholesterol Cal: 46 mg/dL — ABNORMAL HIGH (ref 5–40)

## 2017-04-27 LAB — COMPREHENSIVE METABOLIC PANEL
ALBUMIN: 4.5 g/dL (ref 3.6–4.8)
ALT: 15 IU/L (ref 0–44)
AST: 13 IU/L (ref 0–40)
Albumin/Globulin Ratio: 1.8 (ref 1.2–2.2)
Alkaline Phosphatase: 79 IU/L (ref 39–117)
BUN / CREAT RATIO: 17 (ref 10–24)
BUN: 18 mg/dL (ref 8–27)
Bilirubin Total: 0.3 mg/dL (ref 0.0–1.2)
CALCIUM: 9.9 mg/dL (ref 8.6–10.2)
CO2: 23 mmol/L (ref 20–29)
CREATININE: 1.03 mg/dL (ref 0.76–1.27)
Chloride: 102 mmol/L (ref 96–106)
GFR, EST AFRICAN AMERICAN: 88 mL/min/{1.73_m2} (ref >59)
GFR, EST NON AFRICAN AMERICAN: 76 mL/min/{1.73_m2} (ref >59)
Globulin, Total: 2.5 g/dL (ref 1.5–4.5)
Glucose: 101 mg/dL — ABNORMAL HIGH (ref 65–99)
Potassium: 5.5 mmol/L — ABNORMAL HIGH (ref 3.5–5.2)
Sodium: 141 mmol/L (ref 134–144)
TOTAL PROTEIN: 7 g/dL (ref 6.0–8.5)

## 2017-04-27 LAB — HEMOGLOBIN A1C
Est. average glucose Bld gHb Est-mCnc: 120 mg/dL
Hgb A1c MFr Bld: 5.8 % — ABNORMAL HIGH (ref 4.8–5.6)

## 2017-04-27 NOTE — Telephone Encounter (Signed)
-----   Message from Leonie Man, MD sent at 04/26/2017  5:53 PM EST ----- WOW --  Cholesterol levels went totally the wrong direction.  Total cholesterol is now up to 234 with LDL at 250.  This is the worst has been a long time. If he is truthfully taking simvastatin, we need to convert to Crestor 40 mg --.  We can then recheck in 3 months.  Glenetta Hew, MD

## 2017-04-27 NOTE — Telephone Encounter (Signed)
Spoke to patients wife. Result given.  She state patient has not been taking simvastatin ,but since office visit patient has restarted.Verbalized understanding. Will discuss with Dr Ellyn Hack to continue or change to crestor 40 mg  Wife aware will call back. She also like a copy of labs

## 2017-04-27 NOTE — Telephone Encounter (Signed)
Spoke to Dr Ellyn Hack,  Restart simvastatin  Will recheck lipids in 3 months Called wife , she is aware and verbalized understanding Labs order for 3 months

## 2017-04-28 ENCOUNTER — Encounter: Payer: Self-pay | Admitting: Cardiology

## 2017-04-28 NOTE — Assessment & Plan Note (Signed)
Not as well controlled today.  Initial plan will be to increase ACE inhibitor to 10 mg daily.  We will need to check the present chemistry panel today.

## 2017-04-28 NOTE — Assessment & Plan Note (Signed)
EF of 40-45% by echo and Myoview.  Thankfully, no active heart failure symptoms.  Seems euvolemic.

## 2017-04-28 NOTE — Assessment & Plan Note (Signed)
He actually did lose the weight back that he gained the previous year.  Now hoping to try to get down the closer to 200 pounds by the time I see him back.

## 2017-04-28 NOTE — Assessment & Plan Note (Signed)
His respiratory symptoms have notably improved since I gave him a prescription for an inhaler.  We will refill it again until he is able to establish a new PCP.

## 2017-04-28 NOTE — Assessment & Plan Note (Signed)
He much has patent grafts with left main into circumflex stenting at this point.  No longer on Eliquis.  On aspirin, statin, beta-blocker and ACE inhibitor.  One episode of chest pain relieved with nitroglycerin.  We will monitor this closely to see if there is any more episodes.  Could very well of been related to hypertension.  Treating accordingly.

## 2017-04-28 NOTE — Assessment & Plan Note (Signed)
He specifically asked to stop his Effient during the last visit.  Has been doing well with only one episode of chest pain in the entire year.  Probably okay to continue without dual anti-platelet therapy.

## 2017-04-28 NOTE — Assessment & Plan Note (Signed)
Was not actually interested in talking about smoking cessation.  We discussed it briefly, but not prolonged.  Not yet ready to quit.

## 2017-04-28 NOTE — Assessment & Plan Note (Addendum)
He had been doing fairly well with labs up until this recent check today.  Apparently he has not been taking his statin.  We need to clarify and ensure that he is or is not is taking it, then we will switch him to a more potent statin, if he has not.  We will recheck in 3 months after having him restart.

## 2017-04-28 NOTE — Assessment & Plan Note (Signed)
Persistent scar noted on both Myoview and echo.  Thankfully, no heart failure symptoms, and only one episode to suggest angina.Marland Kitchen

## 2017-04-30 ENCOUNTER — Telehealth: Payer: Self-pay | Admitting: *Deleted

## 2017-04-30 DIAGNOSIS — Z79899 Other long term (current) drug therapy: Secondary | ICD-10-CM

## 2017-04-30 DIAGNOSIS — E875 Hyperkalemia: Secondary | ICD-10-CM

## 2017-04-30 NOTE — Telephone Encounter (Signed)
-----   Message from Leonie Man, MD sent at 04/27/2017  5:31 PM EST ----- K 5.5 - recommend increase PO hydration over the weekend - recheck Chem panel next week (may be inaccurate read). Otherwise, will need to change BP Med to Amlodipine from Ramipril.  Glenetta Hew, MD

## 2017-04-30 NOTE — Telephone Encounter (Signed)
SPOKE TO HIS WIFE .  AWARE OF ELEVATED POTASSIUM .  PLEASE HAVE PATIENT TO DRINK AT LEAST 10 GLASSES OF WATER A DAY FOR NEXT 3 DAYS . HAVE LAB DONE-- BMP ON Thursday  05/03/17  WIFE VERBALIZED UNDERSTANDING AND STATES SHE WILL TELL MR Nesser.

## 2017-05-02 ENCOUNTER — Telehealth: Payer: Self-pay | Admitting: Cardiology

## 2017-05-02 MED ORDER — ALBUTEROL SULFATE HFA 108 (90 BASE) MCG/ACT IN AERS: INHALATION_SPRAY | RESPIRATORY_TRACT | 3 refills | Status: DC

## 2017-05-02 NOTE — Telephone Encounter (Signed)
Returned call to pharmacist at [facility] calling to change quantity of proair inhaler that was sent in yesterday.Advised 90 day refill # 6 inhalers.

## 2017-05-02 NOTE — Telephone Encounter (Signed)
New Message   Pt c/o medication issue:  1. Name of Medication:  albuterol (PROAIR HFA) 108 (90 Base) MCG/ACT inhaler 2. How are you currently taking this medication (dosage and times per day)? None  3. Are you having a reaction (difficulty breathing--STAT)? none 4. What is your medication issue? Carloyn Manner is calling from [facility] wanting to know the quantity that should be dispensed to the patient.

## 2017-05-03 DIAGNOSIS — E875 Hyperkalemia: Secondary | ICD-10-CM | POA: Diagnosis not present

## 2017-05-03 DIAGNOSIS — Z79899 Other long term (current) drug therapy: Secondary | ICD-10-CM | POA: Diagnosis not present

## 2017-05-03 LAB — BASIC METABOLIC PANEL
BUN/Creatinine Ratio: 16 (ref 10–24)
BUN: 17 mg/dL (ref 8–27)
CALCIUM: 9.7 mg/dL (ref 8.6–10.2)
CHLORIDE: 102 mmol/L (ref 96–106)
CO2: 25 mmol/L (ref 20–29)
Creatinine, Ser: 1.05 mg/dL (ref 0.76–1.27)
GFR calc Af Amer: 86 mL/min/{1.73_m2} (ref >59)
GFR calc non Af Amer: 74 mL/min/{1.73_m2} (ref >59)
Glucose: 98 mg/dL (ref 65–99)
POTASSIUM: 5.3 mmol/L — AB (ref 3.5–5.2)
Sodium: 141 mmol/L (ref 134–144)

## 2017-05-03 MED ORDER — NITROGLYCERIN 0.4 MG/SPRAY TL SOLN: 1.0000 | 2 refills | Status: DC | PRN

## 2017-05-03 NOTE — Telephone Encounter (Signed)
Returned call to wife.   States that insurance will not cover the inhaler that was sent in to [facility].    States they will cover Proair HFA AER.   Also request rx for NTG spray sent to [facility].    [facility]  Called [facility] to clarify what is covered.   Spoke to pharmacist, states insurance will cover, no change in prescription needed.    Returned call to wife, aware and verbalized understanding.

## 2017-05-03 NOTE — Telephone Encounter (Signed)
New message  Pt wife verbalized that she is returning call for RN  About her insurance

## 2017-05-07 NOTE — Telephone Encounter (Signed)
-----   Message from Leonie Man, MD sent at 05/04/2017  1:20 PM EST ----- Potassium level is a little bit better at 5.3.  For now I think we will continue to monitor.  Stay hydrated.  Potassium still is high, will probably need switch blood pressure medicines.  Glenetta Hew, MD

## 2017-05-07 NOTE — Telephone Encounter (Signed)
LEFT MESSAGE TO CONTINUE WITH MEDICATION AND KEP HYDRATED ,WILL CALL BACK WITH MORE DETAILS

## 2017-05-30 ENCOUNTER — Encounter: Payer: Self-pay | Admitting: Internal Medicine

## 2017-05-30 ENCOUNTER — Ambulatory Visit (INDEPENDENT_AMBULATORY_CARE_PROVIDER_SITE_OTHER): Payer: Medicare Other | Admitting: Internal Medicine

## 2017-05-30 VITALS — BP 140/86 | HR 62 | Temp 98.5°F | Ht 67.0 in | Wt 213.0 lb

## 2017-05-30 DIAGNOSIS — J449 Chronic obstructive pulmonary disease, unspecified: Secondary | ICD-10-CM

## 2017-05-30 DIAGNOSIS — Z1211 Encounter for screening for malignant neoplasm of colon: Secondary | ICD-10-CM | POA: Diagnosis not present

## 2017-05-30 DIAGNOSIS — Z23 Encounter for immunization: Secondary | ICD-10-CM | POA: Diagnosis not present

## 2017-05-30 DIAGNOSIS — I2510 Atherosclerotic heart disease of native coronary artery without angina pectoris: Secondary | ICD-10-CM

## 2017-05-30 DIAGNOSIS — I10 Essential (primary) hypertension: Secondary | ICD-10-CM | POA: Diagnosis not present

## 2017-05-30 DIAGNOSIS — R0609 Other forms of dyspnea: Secondary | ICD-10-CM | POA: Insufficient documentation

## 2017-05-30 DIAGNOSIS — R0602 Shortness of breath: Secondary | ICD-10-CM | POA: Diagnosis not present

## 2017-05-30 DIAGNOSIS — F17210 Nicotine dependence, cigarettes, uncomplicated: Secondary | ICD-10-CM

## 2017-05-30 MED ORDER — TETANUS-DIPHTHERIA TOXOIDS TD 5-2 LFU IM INJ: 0.5000 mL | INJECTION | Freq: Once | INTRAMUSCULAR | 0 refills | Status: AC

## 2017-05-30 MED ORDER — TIOTROPIUM BROMIDE MONOHYDRATE 1.25 MCG/ACT IN AERS: 1.0000 | INHALATION_SPRAY | Freq: Every day | RESPIRATORY_TRACT | 11 refills | Status: DC

## 2017-05-30 NOTE — Assessment & Plan Note (Signed)
BP Readings from Last 3 Encounters:  05/30/17 140/86  04/26/17 (!) 152/79  04/26/16 140/82   Good control

## 2017-05-30 NOTE — Patient Instructions (Signed)
Please get the tetanus shot at your pharmacy.

## 2017-05-30 NOTE — Assessment & Plan Note (Signed)
No current symptoms Continues with Dr Ellyn Hack

## 2017-05-30 NOTE — Assessment & Plan Note (Signed)
Discussed cessation and offered meds He prefers to try on his own
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
## 2017-05-30 NOTE — Assessment & Plan Note (Signed)
Spirometry--which is basically post inhaler since he used it--is still borderline for COPD Discussed smoking cessation Would try tiotropium to see if better symptom relief

## 2017-05-30 NOTE — Assessment & Plan Note (Signed)
Sounds like COPD Will check spirometry

## 2017-06-05 ENCOUNTER — Other Ambulatory Visit (INDEPENDENT_AMBULATORY_CARE_PROVIDER_SITE_OTHER): Payer: Medicare Other

## 2017-06-05 DIAGNOSIS — Z1211 Encounter for screening for malignant neoplasm of colon: Secondary | ICD-10-CM | POA: Diagnosis not present

## 2017-06-05 LAB — FECAL OCCULT BLOOD, IMMUNOCHEMICAL: Fecal Occult Bld: POSITIVE — AB

## 2017-06-07 ENCOUNTER — Other Ambulatory Visit: Payer: Self-pay | Admitting: Internal Medicine

## 2017-06-07 DIAGNOSIS — R195 Other fecal abnormalities: Secondary | ICD-10-CM

## 2017-06-13 ENCOUNTER — Ambulatory Visit (HOSPITAL_COMMUNITY)
Admission: RE | Admit: 2017-06-13 | Discharge: 2017-06-13 | Disposition: A | Discharge status: Home / Self Care | Payer: Medicare Other | Source: Ambulatory Visit | Attending: Cardiovascular Disease | Admitting: Cardiovascular Disease

## 2017-06-13 DIAGNOSIS — E669 Obesity, unspecified: Secondary | ICD-10-CM | POA: Insufficient documentation

## 2017-06-13 DIAGNOSIS — I2510 Atherosclerotic heart disease of native coronary artery without angina pectoris: Secondary | ICD-10-CM | POA: Insufficient documentation

## 2017-06-13 DIAGNOSIS — E785 Hyperlipidemia, unspecified: Secondary | ICD-10-CM | POA: Insufficient documentation

## 2017-06-13 DIAGNOSIS — I10 Essential (primary) hypertension: Secondary | ICD-10-CM | POA: Diagnosis not present

## 2017-06-13 DIAGNOSIS — J449 Chronic obstructive pulmonary disease, unspecified: Secondary | ICD-10-CM | POA: Insufficient documentation

## 2017-06-13 DIAGNOSIS — F17210 Nicotine dependence, cigarettes, uncomplicated: Secondary | ICD-10-CM | POA: Diagnosis not present

## 2017-06-13 DIAGNOSIS — I252 Old myocardial infarction: Secondary | ICD-10-CM | POA: Diagnosis not present

## 2017-06-25 ENCOUNTER — Telehealth: Payer: Self-pay | Admitting: *Deleted

## 2017-06-25 DIAGNOSIS — Z79899 Other long term (current) drug therapy: Secondary | ICD-10-CM

## 2017-06-25 DIAGNOSIS — E785 Hyperlipidemia, unspecified: Secondary | ICD-10-CM

## 2017-06-25 NOTE — Telephone Encounter (Signed)
LETTER MAILED AND LABSLIP

## 2017-06-25 NOTE — Telephone Encounter (Signed)
-----   Message from Raiford Simmonds, RN sent at 04/27/2017  2:09 PM EST ----- Labs needed 07/25/17 lipid cmp  Will mail @4 /15/19 letter and labslip

## 2017-07-09 ENCOUNTER — Telehealth: Payer: Self-pay

## 2017-07-09 NOTE — Telephone Encounter (Signed)
Left message for patient's wife to call me back-Shayanne Gomm Estell Harpin, RMA

## 2017-07-09 NOTE — Telephone Encounter (Signed)
Can you speak to [facility] again It is not screening since he had a positive FIT

## 2017-07-09 NOTE — Telephone Encounter (Signed)
FYI: I wanted to let you know that I have been following up with patient and his wife about referral to [facility] for positive fecal Occult blood test. Patient spoke with [facility] office, his insurance and me about the coverage. Per his insurance if the procedure gets done as screening colonoscopy insurance will not pay due to he had fecal occult test done already as a screening but if it is diagnostic then he will just pay his co pay. [facility] office said they would schedule it as screening colonoscopy (that is how they have to do it) but due to fecal occult test been positive insurance suppose to bill it as a diagnostic due to abnormal results but could not say 100%. Advised patient's wife of all of this, could not say if insurance would for sure pay as diagnostic but should. Patient wants to hold off on scheduling this for now.-Trana Ressler V Lareta Bruneau, RMA

## 2017-07-10 NOTE — Telephone Encounter (Signed)
Please see if there is anything we can do about this. Had positive FIT and that means increased risk for precancerous or cancerous polyps. I understand their financial concerns but I am uncomfortable about not pursuing this. The fact that they are not seeing blood now is immaterial. We should make every effort to get him a colonoscopy as soon as feasible

## 2017-07-10 NOTE — Telephone Encounter (Signed)
Benjamin Benjamin Robertson called back; pt does not want to do colonoscopy now mainly due to cost. They are having hard time right now financially. Benjamin Robertson did talk with [facility] previously about pricing as well. Benjamin Robertson said pt thinks he may have a hemorrhoid due to heavy lifting at work; pt is not seeing any blood now. FYI to Anastasiya.

## 2017-07-10 NOTE — Telephone Encounter (Signed)
Dt [place] see below. Patient has to pay almost $500 out of pocket for co pay for diagnostic colonoscopy and plus anything else that does nto get covered with insurance such as doctor fees, anesthesia, etc. They are having really hard time financially right now and wife stated to [facility] that patient is not able to do this -Kris Mouton, RMA

## 2017-07-18 NOTE — Telephone Encounter (Signed)
Spoke with patient's wife today as husband was not at home to give one last push for test.  Wife fully understands risks and importance of colonoscopy.  She has been working on husband and has gotten him to agree to having done in June and on an "in-office" basis as he doesn't want to incur a hospital fee.    I told her that I would have Anastasiya further investigate this to see if this could be possible.  Azalee Course is aware and working on a plan with [facility].  Apparently, they do have an "in-office" option.    Azalee Course will keep [place] updated.  Thanks.

## 2017-07-18 NOTE — Telephone Encounter (Signed)
thanks

## 2017-07-19 ENCOUNTER — Encounter: Payer: Self-pay | Admitting: Gastroenterology

## 2017-07-23 ENCOUNTER — Other Ambulatory Visit: Payer: Self-pay | Admitting: Cardiology

## 2017-07-24 DIAGNOSIS — E785 Hyperlipidemia, unspecified: Secondary | ICD-10-CM | POA: Diagnosis not present

## 2017-07-24 DIAGNOSIS — Z79899 Other long term (current) drug therapy: Secondary | ICD-10-CM | POA: Diagnosis not present

## 2017-07-24 LAB — COMPREHENSIVE METABOLIC PANEL
ALT: 15 IU/L (ref 0–44)
AST: 15 IU/L (ref 0–40)
Albumin/Globulin Ratio: 1.5 (ref 1.2–2.2)
Albumin: 4.1 g/dL (ref 3.6–4.8)
Alkaline Phosphatase: 82 IU/L (ref 39–117)
BUN/Creatinine Ratio: 13 (ref 10–24)
BUN: 14 mg/dL (ref 8–27)
Bilirubin Total: 0.2 mg/dL (ref 0.0–1.2)
CALCIUM: 9.4 mg/dL (ref 8.6–10.2)
CHLORIDE: 106 mmol/L (ref 96–106)
CO2: 23 mmol/L (ref 20–29)
Creatinine, Ser: 1.07 mg/dL (ref 0.76–1.27)
GFR, EST AFRICAN AMERICAN: 83 mL/min/{1.73_m2} (ref >59)
GFR, EST NON AFRICAN AMERICAN: 72 mL/min/{1.73_m2} (ref >59)
GLUCOSE: 100 mg/dL — AB (ref 65–99)
Globulin, Total: 2.7 g/dL (ref 1.5–4.5)
Potassium: 4.7 mmol/L (ref 3.5–5.2)
Sodium: 144 mmol/L (ref 134–144)
TOTAL PROTEIN: 6.8 g/dL (ref 6.0–8.5)

## 2017-07-24 LAB — LIPID PANEL
CHOL/HDL RATIO: 4.2 ratio (ref 0.0–5.0)
Cholesterol, Total: 152 mg/dL (ref 100–199)
HDL: 36 mg/dL — ABNORMAL LOW (ref >39)
LDL Calculated: 79 mg/dL (ref 0–99)
Triglycerides: 187 mg/dL — ABNORMAL HIGH (ref 0–149)
VLDL Cholesterol Cal: 37 mg/dL (ref 5–40)

## 2017-07-31 ENCOUNTER — Ambulatory Visit (AMBULATORY_SURGERY_CENTER): Payer: Self-pay

## 2017-07-31 VITALS — Ht 68.0 in | Wt 213.4 lb

## 2017-07-31 DIAGNOSIS — Z1211 Encounter for screening for malignant neoplasm of colon: Secondary | ICD-10-CM

## 2017-07-31 DIAGNOSIS — R195 Other fecal abnormalities: Secondary | ICD-10-CM

## 2017-07-31 MED ORDER — SOD PICOSULFATE-MAG OX-CIT ACD 10-3.5-12 MG-GM -GM/160ML PO SOLN: 1.0000 | Freq: Once | ORAL | Status: AC

## 2017-07-31 NOTE — Progress Notes (Signed)
Per pt, no allergies to soy or egg products.Pt not taking any weight loss meds or using  O2 at home.  Pt refused emmi video. 

## 2017-08-07 ENCOUNTER — Telehealth: Payer: Self-pay | Admitting: *Deleted

## 2017-08-07 ENCOUNTER — Encounter: Payer: Self-pay | Admitting: Gastroenterology

## 2017-08-07 DIAGNOSIS — E785 Hyperlipidemia, unspecified: Secondary | ICD-10-CM

## 2017-08-07 NOTE — Telephone Encounter (Signed)
leftt message to call back -in regards to lab results  order labs for 3 months recheck

## 2017-08-07 NOTE — Telephone Encounter (Signed)
-----   Message from Leonie Man, MD sent at 08/04/2017 12:51 PM EDT -----   Notable improvement in overall cholesterol panel with total cholesterol dropping from 234 down to 152, and LDL dropping from 150 down to 79. This clearly indicates that the lipid panel from 3 months ago was in the setting of not taking simvastatin.  Simvastatin is guided back close to goal.  Can reassess in 3 months to see if he is at the goal.  Glenetta Hew, MD

## 2017-08-10 NOTE — Telephone Encounter (Signed)
Follow up ° ° ° °Please return call to spouse °

## 2017-08-10 NOTE — Telephone Encounter (Signed)
Spoke to patient 's  Wife . Result given . Verbalized understanding Order placed for lab for 3 months

## 2017-08-20 ENCOUNTER — Other Ambulatory Visit: Payer: Self-pay

## 2017-08-20 ENCOUNTER — Other Ambulatory Visit (INDEPENDENT_AMBULATORY_CARE_PROVIDER_SITE_OTHER): Payer: Medicare Other

## 2017-08-20 ENCOUNTER — Encounter: Payer: Self-pay | Admitting: Gastroenterology

## 2017-08-20 ENCOUNTER — Ambulatory Visit (AMBULATORY_SURGERY_CENTER): Payer: Medicare Other | Admitting: Gastroenterology

## 2017-08-20 VITALS — BP 135/72 | HR 55 | Temp 99.1°F | Resp 26 | Ht 68.0 in | Wt 213.0 lb

## 2017-08-20 DIAGNOSIS — R195 Other fecal abnormalities: Secondary | ICD-10-CM

## 2017-08-20 DIAGNOSIS — D129 Benign neoplasm of anus and anal canal: Secondary | ICD-10-CM

## 2017-08-20 DIAGNOSIS — D128 Benign neoplasm of rectum: Secondary | ICD-10-CM

## 2017-08-20 DIAGNOSIS — K621 Rectal polyp: Secondary | ICD-10-CM

## 2017-08-20 LAB — CBC WITH DIFFERENTIAL/PLATELET
BASOS PCT: 1.4 % (ref 0.0–3.0)
Basophils Absolute: 0.1 10*3/uL (ref 0.0–0.1)
EOS PCT: 1.6 % (ref 0.0–5.0)
Eosinophils Absolute: 0.1 10*3/uL (ref 0.0–0.7)
HEMATOCRIT: 48.1 % (ref 39.0–52.0)
Hemoglobin: 16.9 g/dL (ref 13.0–17.0)
Lymphocytes Relative: 29.2 % (ref 12.0–46.0)
Lymphs Abs: 2.6 10*3/uL (ref 0.7–4.0)
MCHC: 35.1 g/dL (ref 30.0–36.0)
MCV: 93.2 fl (ref 78.0–100.0)
MONOS PCT: 8.4 % (ref 3.0–12.0)
Monocytes Absolute: 0.7 10*3/uL (ref 0.1–1.0)
NEUTROS ABS: 5.2 10*3/uL (ref 1.4–7.7)
Neutrophils Relative %: 59.4 % (ref 43.0–77.0)
PLATELETS: 209 10*3/uL (ref 150.0–400.0)
RBC: 5.17 Mil/uL (ref 4.22–5.81)
RDW: 13 % (ref 11.5–15.5)
WBC: 8.8 10*3/uL (ref 4.0–10.5)

## 2017-08-20 MED ORDER — HYDROCORTISONE ACETATE 25 MG RE SUPP: 25.0000 mg | Freq: Two times a day (BID) | RECTAL | 4 refills | Status: DC

## 2017-08-20 MED ORDER — SODIUM CHLORIDE 0.9 % IV SOLN: 500.0000 mL | Freq: Once | INTRAVENOUS | Status: DC

## 2017-08-20 NOTE — Progress Notes (Signed)
Per Dr. Lyndel Safe send Anusol HC supp #20 1 BID PR x 10 days. Refill x4 Sent to [facility].  Rx sent in. Maw  Pt to the Lab on discharge for CBC for heme pos stool.  Called Leeanne Rio, RN and she will place order. maw

## 2017-08-20 NOTE — Progress Notes (Signed)
Called to room to assist during endoscopic procedure.  Patient ID and intended procedure confirmed with present staff. Received instructions for my participation in the procedure from the performing physician.  

## 2017-08-20 NOTE — Progress Notes (Signed)
Pt's states no medical or surgical changes since previsit or office visit. 

## 2017-08-20 NOTE — Patient Instructions (Signed)
YOU HAD AN ENDOSCOPIC PROCEDURE TODAY AT [facility]:   Refer to the procedure report that was given to you for any specific questions about what was found during the examination.  If the procedure report does not answer your questions, please call your gastroenterologist to clarify.  If you requested that your care partner not be given the details of your procedure findings, then the procedure report has been included in a sealed envelope for you to review at your convenience later.  YOU SHOULD EXPECT: Some feelings of bloating in the abdomen. Passage of more gas than usual.  Walking can help get rid of the air that was put into your GI tract during the procedure and reduce the bloating. If you had a lower endoscopy (such as a colonoscopy or flexible sigmoidoscopy) you may notice spotting of blood in your stool or on the toilet paper. If you underwent a bowel prep for your procedure, you may not have a normal bowel movement for a few days.  Please Note:  You might notice some irritation and congestion in your nose or some drainage.  This is from the oxygen used during your procedure.  There is no need for concern and it should clear up in a day or so.  SYMPTOMS TO REPORT IMMEDIATELY:   Following lower endoscopy (colonoscopy or flexible sigmoidoscopy):  Excessive amounts of blood in the stool  Significant tenderness or worsening of abdominal pains  Swelling of the abdomen that is new, acute  Fever of 100F or higher   For urgent or emergent issues, a gastroenterologist can be reached at any hour by calling [phone].   DIET:  We do recommend a small meal at first, but then you may proceed to your regular diet.  Drink plenty of fluids but you should avoid alcoholic beverages for 24 hours.  ACTIVITY:  You should plan to take it easy for the rest of today and you should NOT DRIVE or use heavy machinery until tomorrow (because of the sedation medicines used during the test).     FOLLOW UP: Our staff will call the number listed on your records the next business day following your procedure to check on you and address any questions or concerns that you may have regarding the information given to you following your procedure. If we do not reach you, we will leave a message.  However, if you are feeling well and you are not experiencing any problems, there is no need to return our call.  We will assume that you have returned to your regular daily activities without incident.  If any biopsies were taken you will be contacted by phone or by letter within the next 1-3 weeks.  Please call us at [phone] if you have not heard about the biopsies in 3 weeks.    SIGNATURES/CONFIDENTIALITY: You and/or your care partner have signed paperwork which will be entered into your electronic medical record.  These signatures attest to the fact that that the information above on your After Visit Summary has been reviewed and is understood.  Full responsibility of the confidentiality of this discharge information lies with you and/or your care-partner.    Handouts were given to your care partner on polyps, diverticulosis, and hemorrhoids. Rx was sent to your pharmacy for Anusol HC supp. You may resume your current medications today. Await biopsy results. To Lab in the basement for CBC on discharge. Please call if any questions or concerns.

## 2017-08-20 NOTE — Progress Notes (Signed)
A/ox3 pleased with MAC, report to RN 

## 2017-08-20 NOTE — Op Note (Signed)
[facility] Patient Name: Benjamin Robertson Procedure Date: 08/20/2017 11:06 AM MRN: [id number] Endoscopist: Jackquline Denmark , MD Age: 67 Referring MD:  Date of Birth: Jun 20, 1950 Gender: Male Account #: [id number] Procedure:                Colonoscopy Indications:              Heme positive stool Medicines:                Monitored Anesthesia Care Procedure:                Pre-Anesthesia Assessment:                           - Prior to the procedure, a History and Physical                            was performed, and patient medications and                            allergies were reviewed. The patient is competent.                            The risks and benefits of the procedure and the                            sedation options and risks were discussed with the                            patient. All questions were answered and informed                            consent was obtained. Patient identification and                            proposed procedure were verified by the physician                            in the procedure room. Mental Status Examination:                            alert and oriented. Prophylactic Antibiotics: The                            patient does not require prophylactic antibiotics.                            Prior Anticoagulants: The patient has taken                            aspirin, last dose was day of procedure. ASA Grade                            Assessment: III - A patient with severe systemic  disease. After reviewing the risks and benefits,                            the patient was deemed in satisfactory condition to                            undergo the procedure. The anesthesia plan was to                            use monitored anesthesia care (MAC). Immediately                            prior to administration of medications, the patient                            was re-assessed for adequacy to  receive sedatives.                            The heart rate, respiratory rate, oxygen                            saturations, blood pressure, adequacy of pulmonary                            ventilation, and response to care were monitored                            throughout the procedure. The physical status of                            the patient was re-assessed after the procedure.                           After obtaining informed consent, the colonoscope                            was passed under direct vision. Throughout the                            procedure, the patient's blood pressure, pulse, and                            oxygen saturations were monitored continuously. The                            Colonoscope was introduced through the anus and                            advanced to the 2 cm into the ileum. The                            colonoscopy was performed with ease. The patient  tolerated the procedure well. The quality of the                            bowel preparation was adequate to identify polyps 6                            mm and larger in size. There was some retained                            stool and preparation. Aggressive suctioning                            aspiration was performed. Overall, approximately                            90-95% of the colonic mucosa was visualized                            satisfactorily.. Retroflexed examination was also                            performed in the ascending colon. Scope In: 11:15:05 AM Scope Out: 11:32:35 AM Scope Withdrawal Time: 0 hours 13 minutes 5 seconds  Total Procedure Duration: 0 hours 17 minutes 30 seconds  Findings:                 Two small localized angiodysplastic lesions without                            bleeding were found in the proximal ascending colon                            with classic in appearance measuring approximately                             1 cm each.                           A 4 mm polyp was found in the rectum. The polyp was                            sessile. The polyp was removed with a cold biopsy                            forceps. Resection and retrieval were complete.                            Estimated blood loss: none.                           A few small-mouthed diverticula were found in the                            sigmoid colon and descending colon.  Non-bleeding internal hemorrhoids were found during                            retroflexion. The hemorrhoids were moderate.                           The entire examined colon appeared normal on direct                            and retroflexion views. Estimated Blood Loss:     Estimated blood loss: none. Impression:               - Two non-bleeding colonic AVMs.                           - Rectal polyp status post polypectomy.                           - Mild left colonic diverticulosis.                           - Non-bleeding internal hemorrhoids.                           - Otherwise normal colonoscopy to terminal ileum. Recommendation:           - Patient has a contact number available for                            emergencies. The signs and symptoms of potential                            delayed complications were discussed with the                            patient. Return to normal activities tomorrow.                            Written discharge instructions were provided to the                            patient.                           - Resume previous diet.                           - Continue present medications.                           - Await pathology results.                           - Repeat colonoscopy for surveillance based on                            pathology results.                           -  Check CBC today Jackquline Denmark, MD 08/20/2017 11:42:55 AM This report has been signed electronically.

## 2017-08-21 ENCOUNTER — Telehealth: Payer: Self-pay

## 2017-08-21 NOTE — Telephone Encounter (Signed)
  Follow up Call-  Call back number 08/20/2017  Post procedure Call Back phone  # [phone]  Permission to leave phone message Yes  Some recent data might be hidden     Patient questions:  Do you have a fever, pain , or abdominal swelling? No. Pain Score  0 *  Have you tolerated food without any problems? Yes.    Have you been able to return to your normal activities? Yes.    Do you have any questions about your discharge instructions: Diet   No. Medications  No. Follow up visit  No.  Do you have questions or concerns about your Care? No.  Actions: * If pain score is 4 or above: No action needed, pain <4.

## 2017-08-24 ENCOUNTER — Encounter: Payer: Self-pay | Admitting: Gastroenterology

## 2017-08-28 ENCOUNTER — Telehealth: Payer: Self-pay

## 2017-08-28 NOTE — Telephone Encounter (Signed)
Pathology result letter sent.  No recall indicated secondary to age.

## 2017-10-29 ENCOUNTER — Other Ambulatory Visit: Payer: Self-pay | Admitting: *Deleted

## 2017-10-29 ENCOUNTER — Telehealth: Payer: Self-pay | Admitting: *Deleted

## 2017-10-29 DIAGNOSIS — E785 Hyperlipidemia, unspecified: Secondary | ICD-10-CM

## 2017-10-29 NOTE — Telephone Encounter (Signed)
Mailed letter and labslip due first part of sept 2019

## 2017-10-29 NOTE — Telephone Encounter (Signed)
-----   Message from Raiford Simmonds, RN sent at 08/10/2017  3:26 PM EDT ----- Labs in 11/09/2017' mail@ 10/09/17 -- lipid, heptic

## 2017-11-19 DIAGNOSIS — E785 Hyperlipidemia, unspecified: Secondary | ICD-10-CM | POA: Diagnosis not present

## 2017-11-19 LAB — LIPID PANEL
CHOLESTEROL TOTAL: 148 mg/dL (ref 100–199)
Chol/HDL Ratio: 3.9 ratio (ref 0.0–5.0)
HDL: 38 mg/dL — ABNORMAL LOW (ref >39)
LDL CALC: 81 mg/dL (ref 0–99)
TRIGLYCERIDES: 146 mg/dL (ref 0–149)
VLDL CHOLESTEROL CAL: 29 mg/dL (ref 5–40)

## 2017-11-19 LAB — HEPATIC FUNCTION PANEL
ALT: 14 IU/L (ref 0–44)
AST: 14 IU/L (ref 0–40)
Albumin: 4.2 g/dL (ref 3.6–4.8)
Alkaline Phosphatase: 73 IU/L (ref 39–117)
Bilirubin Total: 0.3 mg/dL (ref 0.0–1.2)
Bilirubin, Direct: 0.11 mg/dL (ref 0.00–0.40)
Total Protein: 6.7 g/dL (ref 6.0–8.5)

## 2017-11-26 ENCOUNTER — Telehealth: Payer: Self-pay | Admitting: *Deleted

## 2017-11-26 NOTE — Telephone Encounter (Signed)
-----   Message from Leonie Man, MD sent at 11/25/2017 10:52 PM EDT ----- Yes let us try to rechallenge.  If he starts having symptoms again, would convert to Crestor 40 mg daily.  Glenetta Hew, MD  ----- Message ----- From: Raiford Simmonds, RN Sent: 11/23/2017  10:48 AM EDT To: Leonie Man, MD  Patient chart states he has an intolerance to atorvastatin ( possible headache and other reactions) . Would you like to rechallenge the this medications

## 2017-11-26 NOTE — Telephone Encounter (Signed)
LEFT MESSAGE TO CALL BACK FOR RESULTS  Notes recorded by Leonie Man, MD on 11/21/2017 at 11:52 PM EDT Lipid panel and LFTs: LFTs completely normal.  Lipid levels also look good. HDL is just barely below target and LDL is at 81. Target is less than 70.  I would like to try to convert from simvastatin 40 mg to atorvastatin 40 mg. -  Please order atorvastatin 40 mg p.o. daily, 30 tablets, 12 refills.   Glenetta Hew, MD

## 2017-12-03 ENCOUNTER — Telehealth: Payer: Self-pay | Admitting: Cardiology

## 2017-12-03 NOTE — Telephone Encounter (Signed)
New message:      Pt's wife is returning a call for lab results.

## 2017-12-03 NOTE — Telephone Encounter (Signed)
Spoke to wife (ok per DPR)-advised of lab results:  Notes recorded by Raiford Simmonds, RN on 11/26/2017 at 6:24 PM EDT LEFT MESSAGE TO CALL BACK ------  Notes recorded by Raiford Simmonds, RN on 11/23/2017 at 10:48 AM EDT Patient chart states he has an intolerance to atorvastatin ( possible headache and other reactions) . Would you like to rechallenge the this medications ------  Notes recorded by Leonie Man, MD on 11/21/2017 at 11:52 PM EDT Lipid panel and LFTs: LFTs completely normal.  Lipid levels also look good. HDL is just barely below target and LDL is at 81. Target is less than 70.  I would like to try to convert from simvastatin 40 mg to atorvastatin 40 mg. -  Please order atorvastatin 40 mg p.o. daily, 30 tablets, 12 refills.   Glenetta Hew, MD    Advised would clarify recommendations with Dr. Ellyn Hack and call back.  Wife aware and verbalized understanding.  Request copy of labs forwarded to PCP.  Forwarded to Dr. Silvio Pate.

## 2017-12-04 MED ORDER — ATORVASTATIN CALCIUM 40 MG PO TABS: 40.0000 mg | ORAL_TABLET | Freq: Every day | ORAL | 6 refills | Status: DC

## 2017-12-04 NOTE — Telephone Encounter (Signed)
Left message on both home phone to call back  see note from 12/03/17

## 2017-12-04 NOTE — Telephone Encounter (Signed)
Spoke to patient and wife. Result given . Verbalized understanding Agree to re-challenge atorvastatin. Prescription e-sent to pharmacy   patient request a 30 day supply to start off. (start with 1/2 tablet for week then increase to whole tablet)

## 2017-12-04 NOTE — Telephone Encounter (Signed)
Yes let us try to rechallenge. If he starts having symptoms again, would convert to Crestor 40 mg daily.   Glenetta Hew, MD     Left message on both home phone to call back

## 2017-12-05 ENCOUNTER — Encounter (INDEPENDENT_AMBULATORY_CARE_PROVIDER_SITE_OTHER): Payer: Self-pay

## 2017-12-05 ENCOUNTER — Ambulatory Visit (INDEPENDENT_AMBULATORY_CARE_PROVIDER_SITE_OTHER): Payer: Medicare Other | Admitting: Internal Medicine

## 2017-12-05 ENCOUNTER — Encounter: Payer: Self-pay | Admitting: Internal Medicine

## 2017-12-05 VITALS — BP 138/80 | HR 59 | Temp 97.8°F | Ht 67.0 in | Wt 209.0 lb

## 2017-12-05 DIAGNOSIS — Z0000 Encounter for general adult medical examination without abnormal findings: Secondary | ICD-10-CM | POA: Diagnosis not present

## 2017-12-05 DIAGNOSIS — Z23 Encounter for immunization: Secondary | ICD-10-CM | POA: Diagnosis not present

## 2017-12-05 DIAGNOSIS — I2510 Atherosclerotic heart disease of native coronary artery without angina pectoris: Secondary | ICD-10-CM | POA: Diagnosis not present

## 2017-12-05 DIAGNOSIS — I10 Essential (primary) hypertension: Secondary | ICD-10-CM

## 2017-12-05 DIAGNOSIS — Z125 Encounter for screening for malignant neoplasm of prostate: Secondary | ICD-10-CM

## 2017-12-05 DIAGNOSIS — Z9861 Coronary angioplasty status: Secondary | ICD-10-CM

## 2017-12-05 DIAGNOSIS — J449 Chronic obstructive pulmonary disease, unspecified: Secondary | ICD-10-CM

## 2017-12-05 DIAGNOSIS — Z7189 Other specified counseling: Secondary | ICD-10-CM | POA: Insufficient documentation

## 2017-12-05 DIAGNOSIS — Z87442 Personal history of urinary calculi: Secondary | ICD-10-CM | POA: Insufficient documentation

## 2017-12-05 LAB — PSA: PSA: 0.82 ng/mL (ref 0.10–4.00)

## 2017-12-05 MED ORDER — TIOTROPIUM BROMIDE MONOHYDRATE 18 MCG IN CAPS: 18.0000 ug | ORAL_CAPSULE | Freq: Every day | RESPIRATORY_TRACT | 12 refills | Status: DC

## 2017-12-05 NOTE — Progress Notes (Signed)
Visual Acuity Screening   Right eye Left eye Both eyes  Without correction: 20/20 20/20 20/15   With correction:     Hearing Screening Comments: Yearly hearing Test at Work in November

## 2017-12-05 NOTE — Assessment & Plan Note (Signed)
BP Readings from Last 3 Encounters:  12/05/17 138/80  08/20/17 135/72  05/30/17 140/86   Good control

## 2017-12-05 NOTE — Assessment & Plan Note (Signed)
Some symptom relief with the tiotropium

## 2017-12-05 NOTE — Assessment & Plan Note (Signed)
I have personally reviewed the Medicare Annual Wellness questionnaire and have noted 1. The patient's medical and social history 2. Their use of alcohol, tobacco or illicit drugs 3. Their current medications and supplements 4. The patient's functional ability including ADL's, fall risks, home safety risks and hearing or visual             impairment. 5. Diet and physical activities 6. Evidence for depression or mood disorders  The patients weight, height, BMI and visual acuity have been recorded in the chart I have made referrals, counseling and provided education to the patient based review of the above and I have provided the pt with a written personalized care plan for preventive services.  I have provided you with a copy of your personalized plan for preventive services. Please take the time to review along with your updated medication list.  Flu vaccine given Just had normal colonoscopy Discussed PSA---- will check Pneumovax next year Discussed fitness Had CT of abdomen in 2006---no AAA

## 2017-12-05 NOTE — Progress Notes (Signed)
Subjective:    Patient ID: Benjamin Robertson, male    DOB: 1950-10-12, 67 y.o.   MRN: [id number]  HPI Here for initial Medicare wellness visit and follow up of chronic health conditions Reviewed form and advanced directives Reviewed other doctors No alcohol Still smokes Vision and hearing are okay No falls No depression or anhedonia Independent with instrumental ADLs No memory problems  Has noticed some improvement in breathing on the tiotropium It is very expensive (though he pays $50). Will change to the capsule one Still works out in heat and dust Uses the rescue regularly--like once or twice a day now (less since on the spiriva) Still smoking---now down to under 1/2PPD Stress with wife caring for her mother with dementia  No chest pain Will occcasionally feel his heart when he gets tired. Brief feeling No dizziness or syncope No edema Continues to work a lot around his house ---all day many days  No recent kidney stones Still sees Dr Edwena Blow yearly  Current Outpatient Medications on File Prior to Visit  Medication Sig Dispense Refill  . albuterol (PROAIR HFA) 108 (90 Base) MCG/ACT inhaler USE 1 TO 2 PUFFS EVERY 4 HOURS AS NEEDED FOR SHORTNESS OF BREATH 6 Inhaler 3  . ASPIRIN 81 PO Take 325 mg by mouth daily.     Marland Kitchen atorvastatin (LIPITOR) 40 MG tablet Take 1 tablet (40 mg total) by mouth daily. 30 tablet 6  . hydrocortisone (ANUSOL-HC) 25 MG suppository Place 1 suppository (25 mg total) rectally every 12 (twelve) hours. Use BID for 10 days. 20 suppository 4  . loratadine (CLARITIN) 10 MG tablet Take 10 mg by mouth daily.    . metoprolol tartrate (LOPRESSOR) 50 MG tablet TAKE 1/2 TABLET BY MOUTH 2 TIMES A DAY 90 tablet 3  . naproxen sodium (ALEVE) 220 MG tablet Take 220 mg by mouth 2 (two) times daily as needed.    . nitroGLYCERIN (NITROLINGUAL) 0.4 MG/SPRAY spray Place 1 spray under the tongue every 5 (five) minutes x 3 doses as needed for chest pain. 4.9 g 2  . [DISCONTINUED]  Tiotropium Bromide Monohydrate 1.25 MCG/ACT AERS Inhale 1 puff into the lungs daily. 4 g 11  . ramipril (ALTACE) 10 MG capsule Take 1 capsule (10 mg total) by mouth daily. 90 capsule 3   No current facility-administered medications on file prior to visit.     Allergies  Allergen Reactions  . Brilinta [Ticagrelor] Shortness Of Breath    Having breathing issues  . Advicor [Niacin-Lovastatin Er] Other (See Comments)    Headache, possibly other reactions  . Lipitor [Atorvastatin] Other (See Comments)    Headache, possibly other reactions  . Pravachol [Pravastatin Sodium] Other (See Comments)    Headache, possibly other reactions    Past Medical History:  Diagnosis Date  . Arthritis    intermittent  . CAD in native artery 1994, 2001   a) 2001 Exertional Angina --> Myoview: Inf infarct and Ant ischemia --> Cath: 95% pLAD, 99% D1, 80% RI, but Cx-OM1&2 OK , & patent RCA stent --> CABG: b) Cath in 2003 patent grafts, RCA & Cx; c) 08/2009 Myoview: Mod Basal-Mid inferoseptal, Basal-apical inferior Infarct. No ischemia.; d) May 2016: PCI to Ostial Cx (not bypassed) - Xience DES 3.0 x 15 (3.6 mm)  . Childhood asthma   . Hemorrhoids    history of  . History of ST elevation myocardial infarction (STEMI) of inferior wall 1994; 1997   PCI of RCA - redo PCI BMS ML Vision  3.0 mm x 25 mm to RCA in 1997  . Hyperlipidemia LDL goal <70    At goal by Most recent labs October 2013: TC 123, TG 83, HDL 81, LDL 65  . Hypertension   . Ischemic cardiomyopathy 1994, 2003   Echo February 2014: EF 40-45%; moderate HK of anterolateral myocardium.; Grade 1 diastolic dysfunction  . Kidney stones    3 times had lithrotripsy  . Obesity (BMI 30.0-34.9)   . Pneumonia 2014  . S/P CABG x 3 2001   LIMA-LAD, SVG-diagonal, SVG-Ramus Intermedius (RI) - patent by Cath in 2003    Past Surgical History:  Procedure Laterality Date  . CARDIAC CATHETERIZATION  06/06/1999   Recommend CABG  . CARDIAC CATHETERIZATION   10/04/2001   Patent grafts with a 50% lesion in LAD beyond IMA insertion  . CARDIAC CATHETERIZATION N/A 07/31/2014   Procedure: Left Heart Cath and Cors/Grafts Angiography;  Surgeon: Jettie Booze, MD;  Location: [facility];  Service: Cardiovascular;  Laterality: N/A;  . CARDIAC CATHETERIZATION N/A 07/31/2014   Procedure: Coronary Stent Intervention;  Surgeon: Jettie Booze, MD;  Location: [facility];  Service: Cardiovascular: PCI to Ostial Cx (not bypassed) - Xience DES 3.0 x 15 (3.6 mm)  . CAROTID DOPPLER  05/06/2012   Less than 50% diameter reduction of the Lft Subclavian.  . CORONARY ANGIOPLASTY WITH STENT PLACEMENT  01/21/1996   PTCA of RCA-95% lesion/per wife 2015  . CORONARY ARTERY BYPASS GRAFT  06/07/1999   x3. LIMA to distal LAD, SVG to first diag, and SVG to ramus  . HYDROCELE EXCISION Left 02/28/2016   Procedure: HYDROCELECTOMY ADULT;  Surgeon: Kathie Rhodes, MD;  Location: [facility];  Service: Urology;  Laterality: Left;  . LITHOTRIPSY  "2-3 times"   1988/2003/2007  . NM MYOVIEW LTD  08/19/2009   Moderafte perfusion seen in Basal inferoseptal, Basal inferior, Mid inferoseptal, Mid inferiorm, and Apical inferior regions. No ECG changes. EKG negative for ischemia.  . TRANSTHORACIC ECHOCARDIOGRAM  05/06/2012   EF 40-45%, LV cavity moderately dilated, systolic function mild-moderately reduced, moderate hypokinesis of anterolatewral myocardium.    Family History  Problem Relation Age of Onset  . Heart attack Mother   . Heart disease Mother   . Stroke Sister        during cancer Rx  . Lymphoma Sister   . Lung cancer Sister     Social History   Socioeconomic History  . Marital status: Married    Spouse name: Not on file  . Number of children: 2  . Years of education: Not on file  . Highest education level: Not on file  Occupational History  . 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 Financial resource strain: Not  on file  . Food insecurity:    Worry: Not on file    Inability: Not on file  . Transportation needs:    Medical: Not on file    Non-medical: Not on file  Tobacco Use  . Smoking status: Current Every Day Smoker    Packs/day: 0.50    Years: 48.00    Pack years: 24.00    Types: Cigarettes  . Smokeless tobacco: Never Used  Substance and Sexual Activity  . Alcohol use: No  . Drug use: No  . Sexual activity: Never  Lifestyle  . Physical activity:    Days per week: Not on file    Minutes per session: Not on file  .  Stress: Not on file  Relationships  . Social connections:    Talks on phone: Not on file    Gets together: Not on file    Attends religious service: Not on file    Active member of club or organization: Not on file    Attends meetings of clubs or organizations: Not on file    Relationship status: Not on file  . Intimate partner violence:    Fear of current or ex partner: Not on file    Emotionally abused: Not on file    Physically abused: Not on file    Forced sexual activity: Not on file  Other Topics Concern  . Not on file  Social History Narrative   Married father of 2 with one grandchild. By his wife.   Continues to smoke one half pack a day.   Very physically active at work but no routine exercise.      No living will    Wife would make decisions for him if needed   Would accept resuscitation   Doesn't want prolonged machines or intervention   Review of Systems Eating less and cut out bread Has lost a few pounds Sleeps okay Wears seat belt Due to see dentist---needs new one for the insurance. Teeth okay No rash or suspicious lesions. No dermatologist. Does wear hat outside for protection Bowels are fine. No blood No urinary problems---stream fine. No dribbling No sex since stent.  No heartburn or dysphagia    Objective:   Physical Exam  Constitutional: He is oriented to person, place, and time. He appears well-developed. No distress.  HENT:    Mouth/Throat: Oropharynx is clear and moist. No oropharyngeal exudate.  Neck: No thyromegaly present.  Cardiovascular: Normal rate, regular rhythm, normal heart sounds and intact distal pulses. Exam reveals no gallop.  No murmur heard. Respiratory: Effort normal. No respiratory distress. He has no wheezes. He has no rales.  Decreased breath sounds but clear  GI: Soft. There is no tenderness.  Musculoskeletal: He exhibits no edema or tenderness.  Lymphadenopathy:    He has no cervical adenopathy.  Neurological: He is alert and oriented to person, place, and time.  President--- "Milinda Pointer, Obama, Bush" 100-92-  "I'm not good in math"  Went through 9th grade D-l-r-o-w Recall 0/3  Skin: No rash noted. No erythema.  Psychiatric: He has a normal mood and affect. His behavior is normal.           Assessment & Plan:

## 2017-12-05 NOTE — Assessment & Plan Note (Signed)
Keeps up with Dr Karsten Ro

## 2017-12-05 NOTE — Assessment & Plan Note (Signed)
See social history 

## 2017-12-05 NOTE — Assessment & Plan Note (Signed)
No symptoms Rx per Dr Ellyn Hack

## 2017-12-05 NOTE — Addendum Note (Signed)
Addended by: Pilar Grammes on: 12/05/2017 11:41 AM   Modules accepted: Orders

## 2017-12-15 DIAGNOSIS — K122 Cellulitis and abscess of mouth: Secondary | ICD-10-CM | POA: Diagnosis not present

## 2018-01-02 ENCOUNTER — Encounter: Payer: Self-pay | Admitting: Cardiology

## 2018-01-02 ENCOUNTER — Ambulatory Visit: Payer: Medicare Other | Admitting: Cardiology

## 2018-01-02 VITALS — BP 132/82 | HR 67 | Ht 67.0 in | Wt 205.2 lb

## 2018-01-02 DIAGNOSIS — Z9861 Coronary angioplasty status: Secondary | ICD-10-CM

## 2018-01-02 DIAGNOSIS — I255 Ischemic cardiomyopathy: Secondary | ICD-10-CM

## 2018-01-02 DIAGNOSIS — I2510 Atherosclerotic heart disease of native coronary artery without angina pectoris: Secondary | ICD-10-CM

## 2018-01-02 DIAGNOSIS — E785 Hyperlipidemia, unspecified: Secondary | ICD-10-CM | POA: Diagnosis not present

## 2018-01-02 DIAGNOSIS — I10 Essential (primary) hypertension: Secondary | ICD-10-CM

## 2018-01-02 DIAGNOSIS — E669 Obesity, unspecified: Secondary | ICD-10-CM

## 2018-01-02 DIAGNOSIS — F17210 Nicotine dependence, cigarettes, uncomplicated: Secondary | ICD-10-CM

## 2018-01-02 NOTE — Patient Instructions (Signed)
Medication Instructions:   NO MEDICATION CHANGES  If you need a refill on your cardiac medications before your next appointment, please call your pharmacy.    Lab work:   IN [place] 2019 , WILL MAY YOU LABSLIP WITH INSTRUCTIONS FOR:      LIPID     CMP  If you have labs (blood work) drawn today and your tests are completely normal, you will receive your results only by: Marland Kitchen MyChart Message (if you have MyChart) OR . A paper copy in the mail If you have any lab test that is abnormal or we need to change your treatment, we will call you to review the results.  Testing/Procedures: NOT NEEDED  Follow-Up: At [facility], you and your health needs are our priority.  As part of our continuing mission to provide you with exceptional heart care, we have created designated Provider Care Teams.  These Care Teams include your primary Cardiologist (physician) and Advanced Practice Providers (APPs -  Physician Assistants and Nurse Practitioners) who all work together to provide you with the care you need, when you need it. You will need a follow up appointment in 12 months OCT 2020.  Please call our office 2 months in advance to schedule this appointment.  You may see Glenetta Hew, MD or one of the following Advanced Practice Providers on your designated Care Team:   Rosaria Ferries, PA-C . Jory Sims, DNP, ANP  Any Other Special Instructions Will Be Listed Below (If Applicable).

## 2018-01-02 NOTE — Progress Notes (Signed)
PCP: Venia Carbon, MD  Clinic Note: Chief Complaint  Patient presents with  . Follow-up    Stable  . Coronary Artery Disease    Stable class I angina  . Hyperlipidemia    Just restarted atorvastatin.  Tolerating    HPI: Benjamin Robertson is a 67 y.o. male with a PMH below who presents today for annual follow-up for CAD-CABG (2001) followed by PCI in 2016.  NSTEMI in May 2016 - PCI to Ostial Cx (not bypassed) - Xience DES 3.0 x 15 (3.6 mm)  Benjamin Robertson was last seen in February 2019. or the most part he been doing fairly well  Was doing well in good spirits.  Close to retirement.  Mild exertional dyspnea because of COPD recent bout of pneumonia.  No cardiac symptoms.  Staying active but not routine exercise.  We started with atorvastatin after most recent lipids.  Recent Hospitalizations: n/a -- tooth infection ~2 wks ago  Studies Personally Reviewed - (if available, images/films reviewed: From Epic Chart or Care Everywhere)  Aortic US 06/13/2017: no AAA. Prox-mid Abd Aorta - abnormal tapering, but no significant stenosis.  Interval History: Benjamin Robertson returns today doing relatively well.  No notable complaints.  Tolerating atorvastatin well - only HA for 1 week.  No myalgias. Only 2-3 x used NTG in last 6 months. DOE up stairs or up incline -- [place]. In [place] last weekend, DOE walking, but no CP. His medic and started ever try to lose weight and has lost about 10 to 12 pounds in the last year.  Stating that he just been eating less and trying to exercise more.  He otherwise denies any symptoms of PND, orthopnea or edema.  No rapid irregular heartbeats or palpitations.  Cardiovascular ROS: positive for - chest pain, dyspnea on exertion and Chest pain with overexertion and exertional dyspnea going up stairs or incline.  No change from before. negative for - edema, irregular heartbeat, orthopnea, palpitations, paroxysmal nocturnal dyspnea, rapid heart rate, shortness of breath or  TIA/amaurosis fugax, syncope/near syncope.  Claudication.  He just had his routine flu shot.  ROS: A comprehensive was performed. Review of Systems  Constitutional: Negative for chills, fever and malaise/fatigue.  HENT: Positive for congestion. Negative for nosebleeds.   Respiratory: Positive for cough, shortness of breath and wheezing.        Smoker's cough.  Also recovering from bout of pneumonia in the fall, and recent URI  Gastrointestinal: Negative for blood in stool and melena.  Musculoskeletal: Positive for joint pain (Bilateral knee).  Neurological: Negative for dizziness.  Psychiatric/Behavioral: Negative.  The patient does not have insomnia.   All other systems reviewed and are negative.  I have reviewed and (if needed) personally updated the patient's problem list, medications, allergies, past medical and surgical history, social and family history.   Past Medical History:  Diagnosis Date  . Arthritis    intermittent  . CAD in native artery 1994, 2001   a) 2001 Exertional Angina --> Myoview: Inf infarct and Ant ischemia --> Cath: 95% pLAD, 99% D1, 80% RI, but Cx-OM1&2 OK , & patent RCA stent --> CABG: b) Cath in 2003 patent grafts, RCA & Cx; c) 08/2009 Myoview: Mod Basal-Mid inferoseptal, Basal-apical inferior Infarct. No ischemia.; d) May 2016: PCI to Ostial Cx (not bypassed) - Xience DES 3.0 x 15 (3.6 mm)  . Childhood asthma   . Hemorrhoids    history of  . History of ST elevation myocardial infarction (STEMI) of  inferior wall 1994; 1997   PCI of RCA - redo PCI BMS ML Vision 3.0 mm x 25 mm to RCA in 1997  . Hyperlipidemia LDL goal <70    At goal by Most recent labs October 2013: TC 123, TG 83, HDL 81, LDL 65  . Hypertension   . Ischemic cardiomyopathy 1994, 2003   Echo February 2014: EF 40-45%; moderate HK of anterolateral myocardium.; Grade 1 diastolic dysfunction  . Kidney stones    3 times had lithrotripsy  . Obesity (BMI 30.0-34.9)   . Pneumonia 2014  . S/P CABG  x 3 2001   LIMA-LAD, SVG-diagonal, SVG-Ramus Intermedius (RI) - patent by Cath in 2003    Past Surgical History:  Procedure Laterality Date  . CARDIAC CATHETERIZATION  06/06/1999   Recommend CABG  . CARDIAC CATHETERIZATION  10/04/2001   Patent grafts with a 50% lesion in LAD beyond IMA insertion  . CARDIAC CATHETERIZATION N/A 07/31/2014   Procedure: Left Heart Cath and Cors/Grafts Angiography;  Surgeon: Jettie Booze, MD;  Location: [facility];  Service: Cardiovascular;  Laterality: N/A;  . CARDIAC CATHETERIZATION N/A 07/31/2014   Procedure: Coronary Stent Intervention;  Surgeon: Jettie Booze, MD;  Location: [facility];  Service: Cardiovascular: PCI to Ostial Cx (not bypassed) - Xience DES 3.0 x 15 (3.6 mm)  . CAROTID DOPPLER  05/06/2012   Less than 50% diameter reduction of the Lft Subclavian.  . CORONARY ANGIOPLASTY WITH STENT PLACEMENT  01/21/1996   PTCA of RCA-95% lesion/per wife 2015  . CORONARY ARTERY BYPASS GRAFT  06/07/1999   x3. LIMA to distal LAD, SVG to first diag, and SVG to ramus  . HYDROCELE EXCISION Left 02/28/2016   Procedure: HYDROCELECTOMY ADULT;  Surgeon: Kathie Rhodes, MD;  Location: [facility];  Service: Urology;  Laterality: Left;  . LITHOTRIPSY  "2-3 times"   1988/2003/2007  . NM MYOVIEW LTD  08/19/2009   Moderafte perfusion seen in Basal inferoseptal, Basal inferior, Mid inferoseptal, Mid inferiorm, and Apical inferior regions. No ECG changes. EKG negative for ischemia.  . TRANSTHORACIC ECHOCARDIOGRAM  05/06/2012   EF 40-45%, LV cavity moderately dilated, systolic function mild-moderately reduced, moderate hypokinesis of anterolatewral myocardium.    Current Meds  Medication Sig  . albuterol (PROAIR HFA) 108 (90 Base) MCG/ACT inhaler USE 1 TO 2 PUFFS EVERY 4 HOURS AS NEEDED FOR SHORTNESS OF BREATH  . ASPIRIN 81 PO Take 325 mg by mouth daily.   Marland Kitchen atorvastatin (LIPITOR) 40 MG tablet Take 1 tablet (40 mg total) by mouth daily.  . hydrocortisone  (ANUSOL-HC) 25 MG suppository Place 1 suppository (25 mg total) rectally every 12 (twelve) hours. Use BID for 10 days.  Marland Kitchen loratadine (CLARITIN) 10 MG tablet Take 10 mg by mouth daily.  . metoprolol tartrate (LOPRESSOR) 50 MG tablet TAKE 1/2 TABLET BY MOUTH 2 TIMES A DAY  . naproxen sodium (ALEVE) 220 MG tablet Take 220 mg by mouth 2 (two) times daily as needed.  . nitroGLYCERIN (NITROLINGUAL) 0.4 MG/SPRAY spray Place 1 spray under the tongue every 5 (five) minutes x 3 doses as needed for chest pain.  . ramipril (ALTACE) 10 MG capsule Take 1 capsule (10 mg total) by mouth daily.  Marland Kitchen tiotropium (SPIRIVA) 18 MCG inhalation capsule Place 1 capsule (18 mcg total) into inhaler and inhale daily.    Allergies  Allergen Reactions  . Brilinta [Ticagrelor] Shortness Of Breath    Having breathing issues  . Advicor [Niacin-Lovastatin Er] Other (See Comments)  Headache, possibly other reactions  . Lipitor [Atorvastatin] Other (See Comments)    Headache, possibly other reactions  . Pravachol [Pravastatin Sodium] Other (See Comments)    Headache, possibly other reactions    Social History   Tobacco Use  . Smoking status: Current Every Day Smoker    Packs/day: 0.50    Years: 48.00    Pack years: 24.00    Types: Cigarettes  . Smokeless tobacco: Never Used  Substance Use Topics  . Alcohol use: No  . Drug use: No   Social History   Social History Narrative   Married father of 2 with one grandchild. By his wife.   Continues to smoke one half pack a day.   Very physically active at work but no routine exercise.      No living will    Wife would make decisions for him if needed   Would accept resuscitation   Doesn't want prolonged machines or intervention    family history includes Heart attack in his mother; Heart disease in his mother; Lung cancer in his sister; Lymphoma in his sister; Stroke in his sister.  Wt Readings from Last 3 Encounters:  01/02/18 205 lb 4 oz (93.1 kg)  12/05/17  209 lb (94.8 kg)  08/20/17 213 lb (96.6 kg)    PHYSICAL EXAM BP 132/82   Pulse 67   Ht 5\' 7"  (1.702 m)   Wt 205 lb 4 oz (93.1 kg)   SpO2 97%   BMI 32.15 kg/m  Physical Exam  Constitutional: He is oriented to person, place, and time. He appears well-developed and well-nourished. No distress.  Mildly obese.  Well-groomed  HENT:  Head: Normocephalic and atraumatic.  Mouth/Throat: No oropharyngeal exudate.  Neck: No hepatojugular reflux and no JVD present. Carotid bruit is not present. No thyromegaly present.  Cardiovascular: Normal rate, regular rhythm, normal heart sounds, intact distal pulses and normal pulses.  No extrasystoles are present. PMI is not displaced. Exam reveals no gallop and no friction rub.  No murmur heard. Pulmonary/Chest: Effort normal and breath sounds normal. No respiratory distress. He has no wheezes. He has no rales. He exhibits no tenderness.  Mild diffuse interstitial sounds, but no rales rhonchi or wheezing.  Abdominal: Soft. Bowel sounds are normal. He exhibits no distension. There is no tenderness. There is no rebound.  Musculoskeletal: Normal range of motion. He exhibits no edema (Trace).  Lymphadenopathy:    He has no cervical adenopathy.  Neurological: He is alert and oriented to person, place, and time.  Skin:  Ruddy complexion complexion.  No change  Psychiatric: He has a normal mood and affect. His behavior is normal. Judgment and thought content normal.  Vitals reviewed.   Adult ECG Report n/a  Other studies Reviewed: Additional studies/ records that were reviewed today include:  Recent Labs:  Due for labs ordered today Lab Results  Component Value Date   CHOL 148 11/19/2017   HDL 38 (L) 11/19/2017   LDLCALC 81 11/19/2017   TRIG 146 11/19/2017   CHOLHDL 3.9 11/19/2017  --Re-challenged with Atorvastatin.  ASSESSMENT / PLAN:  Problem List Items Addressed This Visit    CAD in native artery - status post CABG x3 after PCI x2 to the RCA  (LIMA-LAD, SVG-diagonal SVG-RI - Primary (Chronic)    Relatively stable may be class I angina with exertional dyspnea more than angina.  Minimal need for nitroglycerin. On metoprolol at current dose with adequate antianginal effect having increase his afterload reduction with ramipril. Did  not do well with nitrates in the past. -->  For additional antianginal treatment, would probably consider Ranexa or return to Cath Lab if symptoms were to worsen.      Relevant Orders   Comprehensive metabolic panel   CAD S/P PCI - DES PCI to prox Cx XIENCE ALPINE RX 3.0X15  (Chronic)    He is pretty much been doing quite well since his 2016 PCI.  Probably has some angina from microvascular disease, but not enlarged vessels. Remains on aspirin (can reduced to 81 mg), along with beta-blocker and now restarted statin.      Cigarette smoker one half pack a day or less (Chronic)    He still does not want to try any medications to quit.  Leery of using Chantix.  He does not necessarily use the patches, although I think this is probably the best option either patches or Nicorette lozenges. I do not think he is quite at this stage yet of being ready to make the steps to quit.   We will need to continue to work toward commencing him..      Essential hypertension (Chronic)    Blood pressure looks better now having increased dose of ramipril to 10 mg.      Hyperlipidemia with target LDL less than 70 (Chronic)    LDL was 81 on last check.  We restarted him on atorvastatin and he seems to be tolerating it relatively well at this point.  Recheck labs in December.      Relevant Orders   Lipid panel   Comprehensive metabolic panel   Ischemic cardiomyopathy (Chronic)    Mildly reduced EF by echo and Myoview.  He does not really seem to have any heart failure symptoms besides exertional dyspnea.  He is on stable dose of beta-blocker and now with increased dose of ACE inhibitor. Euvolemic on exam.  No diuretic  requirement.      Relevant Orders   Comprehensive metabolic panel   Obesity (BMI 30.0-34.9) (Chronic)    He been doing really well with weight loss, had been down below his current weight and is hoping to try to get down to the 190s.  He has lost about 10 to 12 pounds in this past year.  I encouraged his efforts and recommended he continue.  Another 10 to 12 pounds would put him at a reasonable weight.      Relevant Orders   Lipid panel      Current medicines are reviewed at length with the patient today. (+/- concerns) none The following changes have been made: none  Patient Instructions  Medication Instructions:   NO MEDICATION CHANGES  If you need a refill on your cardiac medications before your next appointment, please call your pharmacy.    Lab work:   IN [place] 2019 , WILL MAY YOU LABSLIP WITH INSTRUCTIONS FOR:      LIPID     CMP  If you have labs (blood work) drawn today and your tests are completely normal, you will receive your results only by: Marland Kitchen MyChart Message (if you have MyChart) OR . A paper copy in the mail If you have any lab test that is abnormal or we need to change your treatment, we will call you to review the results.  Testing/Procedures: NOT NEEDED  Follow-Up: At [facility], you and your health needs are our priority.  As part of our continuing mission to provide you with exceptional heart care, we have created designated Provider Care  Teams.  These Care Teams include your primary Cardiologist (physician) and Advanced Practice Providers (APPs -  Physician Assistants and Nurse Practitioners) who all work together to provide you with the care you need, when you need it. You will need a follow up appointment in 12 months OCT 2020.  Please call our office 2 months in advance to schedule this appointment.  You may see Glenetta Hew, MD or one of the following Advanced Practice Providers on your designated Care Team:   Rosaria Ferries, PA-C . Jory Sims, DNP, ANP  Any Other Special Instructions Will Be Listed Below (If Applicable).    Studies Ordered:   Orders Placed This Encounter  Procedures  . Lipid panel  . Comprehensive metabolic panel      Glenetta Hew, M.D., M.S. Interventional Cardiologist   Pager # [phone] Phone # [phone] [address] [place], [place] [address]   Thank you for choosing Heartcare at [facility]!!

## 2018-01-05 ENCOUNTER — Encounter: Payer: Self-pay | Admitting: Cardiology

## 2018-01-05 NOTE — Assessment & Plan Note (Signed)
Mildly reduced EF by echo and Myoview.  He does not really seem to have any heart failure symptoms besides exertional dyspnea.  He is on stable dose of beta-blocker and now with increased dose of ACE inhibitor. Euvolemic on exam.  No diuretic requirement.

## 2018-01-05 NOTE — Assessment & Plan Note (Signed)
He is pretty much been doing quite well since his 2016 PCI.  Probably has some angina from microvascular disease, but not enlarged vessels. Remains on aspirin (can reduced to 81 mg), along with beta-blocker and now restarted statin.

## 2018-01-05 NOTE — Assessment & Plan Note (Signed)
He still does not want to try any medications to quit.  Leery of using Chantix.  He does not necessarily use the patches, although I think this is probably the best option either patches or Nicorette lozenges. I do not think he is quite at this stage yet of being ready to make the steps to quit.   We will need to continue to work toward commencing him.Marland Kitchen

## 2018-01-05 NOTE — Assessment & Plan Note (Signed)
LDL was 81 on last check.  We restarted him on atorvastatin and he seems to be tolerating it relatively well at this point.  Recheck labs in December.

## 2018-01-05 NOTE — Assessment & Plan Note (Signed)
Blood pressure looks better now having increased dose of ramipril to 10 mg.

## 2018-01-05 NOTE — Assessment & Plan Note (Signed)
He been doing really well with weight loss, had been down below his current weight and is hoping to try to get down to the 190s.  He has lost about 10 to 12 pounds in this past year.  I encouraged his efforts and recommended he continue.  Another 10 to 12 pounds would put him at a reasonable weight.

## 2018-01-05 NOTE — Assessment & Plan Note (Signed)
Relatively stable may be class I angina with exertional dyspnea more than angina.  Minimal need for nitroglycerin. On metoprolol at current dose with adequate antianginal effect having increase his afterload reduction with ramipril. Did not do well with nitrates in the past. -->  For additional antianginal treatment, would probably consider Ranexa or return to Cath Lab if symptoms were to worsen.

## 2018-02-12 ENCOUNTER — Telehealth: Payer: Self-pay | Admitting: *Deleted

## 2018-02-12 DIAGNOSIS — E785 Hyperlipidemia, unspecified: Secondary | ICD-10-CM

## 2018-02-12 DIAGNOSIS — I255 Ischemic cardiomyopathy: Secondary | ICD-10-CM

## 2018-02-12 DIAGNOSIS — I2510 Atherosclerotic heart disease of native coronary artery without angina pectoris: Secondary | ICD-10-CM

## 2018-02-12 DIAGNOSIS — E669 Obesity, unspecified: Secondary | ICD-10-CM

## 2018-02-12 NOTE — Telephone Encounter (Signed)
Mailed letter and labslip 

## 2018-02-12 NOTE — Telephone Encounter (Signed)
-----   Message from Raiford Simmonds, RN sent at 01/02/2018 10:52 AM EDT ----- LABS  LIPID, CMP IN DEC 2019 [email or web address]  23 ,2019 LETTER AND LAB SLIP

## 2018-03-08 DIAGNOSIS — I2510 Atherosclerotic heart disease of native coronary artery without angina pectoris: Secondary | ICD-10-CM | POA: Diagnosis not present

## 2018-03-08 DIAGNOSIS — I255 Ischemic cardiomyopathy: Secondary | ICD-10-CM | POA: Diagnosis not present

## 2018-03-08 DIAGNOSIS — E785 Hyperlipidemia, unspecified: Secondary | ICD-10-CM | POA: Diagnosis not present

## 2018-03-08 LAB — LIPID PANEL
CHOL/HDL RATIO: 3.6 ratio (ref 0.0–5.0)
Cholesterol, Total: 139 mg/dL (ref 100–199)
HDL: 39 mg/dL — ABNORMAL LOW (ref >39)
LDL Calculated: 80 mg/dL (ref 0–99)
TRIGLYCERIDES: 101 mg/dL (ref 0–149)
VLDL Cholesterol Cal: 20 mg/dL (ref 5–40)

## 2018-03-08 LAB — COMPREHENSIVE METABOLIC PANEL
A/G RATIO: 1.7 (ref 1.2–2.2)
ALBUMIN: 4 g/dL (ref 3.6–4.8)
ALT: 15 IU/L (ref 0–44)
AST: 11 IU/L (ref 0–40)
Alkaline Phosphatase: 83 IU/L (ref 39–117)
BUN/Creatinine Ratio: 15 (ref 10–24)
BUN: 14 mg/dL (ref 8–27)
Bilirubin Total: 0.2 mg/dL (ref 0.0–1.2)
CALCIUM: 9.8 mg/dL (ref 8.6–10.2)
CO2: 23 mmol/L (ref 20–29)
Chloride: 104 mmol/L (ref 96–106)
Creatinine, Ser: 0.93 mg/dL (ref 0.76–1.27)
GFR calc Af Amer: 99 mL/min/{1.73_m2} (ref >59)
GFR, EST NON AFRICAN AMERICAN: 85 mL/min/{1.73_m2} (ref >59)
GLOBULIN, TOTAL: 2.4 g/dL (ref 1.5–4.5)
Glucose: 98 mg/dL (ref 65–99)
POTASSIUM: 5.1 mmol/L (ref 3.5–5.2)
Sodium: 141 mmol/L (ref 134–144)
Total Protein: 6.4 g/dL (ref 6.0–8.5)

## 2018-03-12 ENCOUNTER — Other Ambulatory Visit: Payer: Self-pay

## 2018-03-12 ENCOUNTER — Telehealth: Payer: Self-pay | Admitting: *Deleted

## 2018-03-12 DIAGNOSIS — E669 Obesity, unspecified: Secondary | ICD-10-CM

## 2018-03-12 DIAGNOSIS — E785 Hyperlipidemia, unspecified: Secondary | ICD-10-CM

## 2018-03-12 DIAGNOSIS — I2510 Atherosclerotic heart disease of native coronary artery without angina pectoris: Secondary | ICD-10-CM

## 2018-03-12 MED ORDER — ROSUVASTATIN CALCIUM 40 MG PO TABS: 40.0000 mg | ORAL_TABLET | Freq: Every day | ORAL | 6 refills | Status: DC

## 2018-03-12 MED ORDER — ROSUVASTATIN CALCIUM 40 MG PO TABS: 40.0000 mg | ORAL_TABLET | Freq: Every day | ORAL | 1 refills | Status: DC

## 2018-03-12 NOTE — Telephone Encounter (Signed)
Spoke to wife and patient concerning  Lab results. Both verbalized understanding.  Requested  30 day supply be sent in first  To see patient can tolerate rosuvastatin.  patient will complete the bottle ( half bottle left) E-sent rx, labs will be done in 4 months

## 2018-03-12 NOTE — Telephone Encounter (Signed)
-----   Message from Leonie Man, MD sent at 03/11/2018  4:55 PM EST ----- Chemistry panel is stable.  Shows normal kidney and liver function. . Cholesterol level is also relatively stable compared to 3 months ago.  We need a little bit better control with LDL trying to get less than 70.  Currently at 35. Plan will be to convert from atorvastatin 40 mg daily to rosuvastatin 40 mg daily after completion of current bottle.  Prescribe rosuvastatin 40 mg p.o. daily, dispense 90 pills with 4 refills.  Glenetta Hew, MD

## 2018-03-17 ENCOUNTER — Other Ambulatory Visit: Payer: Self-pay | Admitting: Cardiology

## 2018-03-18 NOTE — Telephone Encounter (Signed)
Rx request sent to pharmacy.  

## 2018-04-19 ENCOUNTER — Other Ambulatory Visit: Payer: Self-pay | Admitting: Cardiology

## 2018-05-24 ENCOUNTER — Telehealth: Payer: Self-pay | Admitting: Cardiology

## 2018-05-24 NOTE — Telephone Encounter (Signed)
Spoke to wife . She states patient just started rosuvastatin this week - she staes he developed shortness of breathe and  Has become very tired . Patient is relating to Rosuvastatin. Patient had completed atorvastatin prior to starting rosuvastatin and he did not have any problems  Per wife.  RN informed wife to have patient to hold medication until response from Dr Ellyn Hack, she is aware the doctor is out of th e office next week.

## 2018-05-24 NOTE — Telephone Encounter (Signed)
New Message            Pt c/o medication issue:  1. Name of Medication: Rosuvastatin 40 mg  2. How are you currently taking this medication (dosage and times per day)? 1 x a day  3. Are you having a reaction (difficulty breathing--STAT)?  SOB  4. What is your medication issue? Meds not working for patient

## 2018-05-25 NOTE — Telephone Encounter (Signed)
Wait 2 weeks.  Then rechallenge, if symptoms recur.  Then simply go back to atorvastatin Not a known reaction to Crestor.  Glenetta Hew, MD

## 2018-05-31 ENCOUNTER — Telehealth: Payer: Self-pay | Admitting: Internal Medicine

## 2018-05-31 NOTE — Telephone Encounter (Signed)
Spoke to wife. She said he is afraid he would have to wear a mask. He feels claustrophobic and has a hard time breathing. He is afraid he will have to wear one because of everything that is going on. I advised her that was not a valid reason to write him out of jury duty. Courts have been postponed due to the current pandemic. When his time comes, he would not need a mask.

## 2018-05-31 NOTE — Telephone Encounter (Signed)
Left message to call office

## 2018-05-31 NOTE — Telephone Encounter (Signed)
I will close this note until we receive the letter.

## 2018-05-31 NOTE — Telephone Encounter (Signed)
Form placed in Dr Letvak's inbox on his desk 

## 2018-05-31 NOTE — Telephone Encounter (Signed)
I am not sure of why he would qualify for a deferral for jury duty so I am not sure I can do this. I certainly need a specific reason for this, but I don't see a clear indication based on my last visit

## 2018-05-31 NOTE — Telephone Encounter (Signed)
Pt's wife dropped off [employer] for pt. She states it needs to be filled out by Dr. Silvio Pate. Form placed in Rx tower.

## 2018-05-31 NOTE — Telephone Encounter (Signed)
Patient's wife,Sharon, returned Shannon's call.  Please call [name] at [phone].

## 2018-05-31 NOTE — Telephone Encounter (Signed)
Best number [phone] [name] (wife) called  Pt has jury summons needing letter to be excused for jury duty.  Spouse will drop off letter that pt received

## 2018-06-01 NOTE — Telephone Encounter (Signed)
Agreed Have him pick his paperwork back up or put it in the mail to him

## 2018-06-03 NOTE — Telephone Encounter (Signed)
Spoke to wife. She said she had a copy so we can shred what we have.

## 2018-06-18 NOTE — Telephone Encounter (Signed)
Spoke to wife - information given to her. She states she will inform patient.  RN  Informed wife  If patient id unable to tolerate rosuvastatin - contact office - will need to place a new order for atorvastatin  40 mg  And cancel crestor 40 mg    wife will call back if patient can not tolerated the rosuvastatin 40 mg .

## 2018-06-19 ENCOUNTER — Telehealth: Payer: Self-pay | Admitting: Cardiology

## 2018-06-19 NOTE — Telephone Encounter (Signed)
New Message   Pt says she spoke with [name] and she is returning the call with the name of the medication that needs to be Filled      *STAT* If patient is at the pharmacy, call can be transferred to refill team.   1. Which medications need to be refilled? (please list name of each medication and dose if known) Atrovastitan 40mg   2. Which pharmacy/location (including street and city if local pharmacy) is medication to be sent to? [facility]   3. Do they need a 30 day or 90 day supply? 90 Days

## 2018-06-19 NOTE — Telephone Encounter (Deleted)
Atorvastatin refilled.  

## 2018-06-20 MED ORDER — ATORVASTATIN CALCIUM 40 MG PO TABS: 40.0000 mg | ORAL_TABLET | Freq: Every day | ORAL | 3 refills | Status: DC

## 2018-06-20 NOTE — Addendum Note (Signed)
Addended by: Raiford Simmonds on: 06/20/2018 11:28 AM   Modules accepted: Orders

## 2018-06-20 NOTE — Telephone Encounter (Signed)
E SENT  PRESCRIPTION TO [facility] .  ATORVASTATIN 40 MG DAILY

## 2018-08-16 ENCOUNTER — Other Ambulatory Visit: Payer: Self-pay | Admitting: Internal Medicine

## 2018-09-09 ENCOUNTER — Telehealth: Payer: Self-pay | Admitting: *Deleted

## 2018-09-09 ENCOUNTER — Other Ambulatory Visit: Payer: Self-pay | Admitting: Internal Medicine

## 2018-09-09 MED ORDER — SPIRIVA RESPIMAT 1.25 MCG/ACT IN AERS: 1.0000 | INHALATION_SPRAY | Freq: Every day | RESPIRATORY_TRACT | 3 refills | Status: DC

## 2018-09-09 NOTE — Telephone Encounter (Signed)
°  Patient's Wife called back and stated she spoke with [facility] and they are telling the patient that they never received any refills from our office for the patient's Ladona Mow C/B [phone]

## 2018-09-09 NOTE — Addendum Note (Signed)
Addended by: Pilar Grammes on: 09/09/2018 04:53 PM   Modules accepted: Orders

## 2018-09-09 NOTE — Telephone Encounter (Signed)
Spoke to pt's wife to verifiy which Spiriva it was. I went ahead and sent it in again.

## 2018-09-09 NOTE — Telephone Encounter (Signed)
Patient's wife called requesting a refill on Spriva. Advised Mrs. Seyer that a script was sent to [facility] on 08/19/18 with 3 refills.  Mrs. Savary stated that she will talk with someone at [facility] and advised them of this.

## 2018-09-20 NOTE — Telephone Encounter (Signed)
Patient's wife is requesting a call back to discuss prescription. She stated that [facility] is telling them they still have not received this.   C/B # [phone]

## 2018-09-20 NOTE — Telephone Encounter (Signed)
Spoke to pt's wife per DPR. Advised her that we sent the RX 09-09-18. They keep telling her they are waiting to hear from [place]. She is going to call them back and ask what are they waiting on [place] to do since the rx was sent in 09-09-18.

## 2018-09-20 NOTE — Telephone Encounter (Signed)
[facility] called the triage line and I happened to get the call. They were asking about the atypical dosing of 1 puff daily. I told them we put what the pt said he was taking. The person said he would not get his medication until we respond to the dosing question. I advised her the pt's wife was told the rx would be sent as is so he would not be without medication. She said that was not a possibility until we say yes or no to the way it read. I told her to leave it as is for now so that he can get his medication and we will fix it later if needed.  Spoke to wife to let her know what we discussed.

## 2018-09-20 NOTE — Telephone Encounter (Signed)
Patient's Wife called back and stated she called [facility] again   They stated that they are needing clarification on how many puffs the patient's does with the medication. They advised her the have not heard back from the office to clarify this.  [facility] let the wife know they would go ahead and send this medication to the patient so he will not be out.

## 2018-12-11 ENCOUNTER — Encounter: Payer: Medicare Other | Admitting: Internal Medicine

## 2018-12-17 ENCOUNTER — Other Ambulatory Visit: Payer: Self-pay | Admitting: Cardiology

## 2019-01-10 ENCOUNTER — Telehealth: Payer: Self-pay | Admitting: Cardiology

## 2019-01-10 NOTE — Telephone Encounter (Signed)
° ° °  Spouse requesting to accompany patient during appointment 11/2

## 2019-01-10 NOTE — Telephone Encounter (Signed)
[id number] 

## 2019-01-13 ENCOUNTER — Other Ambulatory Visit: Payer: Self-pay

## 2019-01-13 ENCOUNTER — Encounter: Payer: Self-pay | Admitting: Cardiology

## 2019-01-13 ENCOUNTER — Ambulatory Visit: Payer: Medicare Other | Admitting: Cardiology

## 2019-01-13 VITALS — BP 170/90 | HR 65 | Ht 68.0 in | Wt 209.0 lb

## 2019-01-13 DIAGNOSIS — Z0000 Encounter for general adult medical examination without abnormal findings: Secondary | ICD-10-CM | POA: Diagnosis not present

## 2019-01-13 DIAGNOSIS — I10 Essential (primary) hypertension: Secondary | ICD-10-CM

## 2019-01-13 DIAGNOSIS — E785 Hyperlipidemia, unspecified: Secondary | ICD-10-CM | POA: Diagnosis not present

## 2019-01-13 DIAGNOSIS — Z955 Presence of coronary angioplasty implant and graft: Secondary | ICD-10-CM

## 2019-01-13 DIAGNOSIS — E66811 Obesity, class 1: Secondary | ICD-10-CM

## 2019-01-13 DIAGNOSIS — E669 Obesity, unspecified: Secondary | ICD-10-CM

## 2019-01-13 DIAGNOSIS — Z9861 Coronary angioplasty status: Secondary | ICD-10-CM | POA: Diagnosis not present

## 2019-01-13 DIAGNOSIS — I255 Ischemic cardiomyopathy: Secondary | ICD-10-CM

## 2019-01-13 DIAGNOSIS — I2119 ST elevation (STEMI) myocardial infarction involving other coronary artery of inferior wall: Secondary | ICD-10-CM

## 2019-01-13 DIAGNOSIS — I2510 Atherosclerotic heart disease of native coronary artery without angina pectoris: Secondary | ICD-10-CM

## 2019-01-13 DIAGNOSIS — F17210 Nicotine dependence, cigarettes, uncomplicated: Secondary | ICD-10-CM

## 2019-01-13 LAB — CBC
Hematocrit: 51 % (ref 37.5–51.0)
Hemoglobin: 17.9 g/dL — ABNORMAL HIGH (ref 13.0–17.7)
MCH: 32.4 pg (ref 26.6–33.0)
MCHC: 35.1 g/dL (ref 31.5–35.7)
MCV: 92 fL (ref 79–97)
Platelets: 226 10*3/uL (ref 150–450)
RBC: 5.53 x10E6/uL (ref 4.14–5.80)
RDW: 13.2 % (ref 11.6–15.4)
WBC: 8.3 10*3/uL (ref 3.4–10.8)

## 2019-01-13 LAB — COMPREHENSIVE METABOLIC PANEL
ALT: 13 IU/L (ref 0–44)
AST: 13 IU/L (ref 0–40)
Albumin/Globulin Ratio: 1.6 (ref 1.2–2.2)
Albumin: 4.2 g/dL (ref 3.8–4.8)
Alkaline Phosphatase: 91 IU/L (ref 39–117)
BUN/Creatinine Ratio: 19 (ref 10–24)
BUN: 19 mg/dL (ref 8–27)
Bilirubin Total: 0.3 mg/dL (ref 0.0–1.2)
CO2: 21 mmol/L (ref 20–29)
Calcium: 9.8 mg/dL (ref 8.6–10.2)
Chloride: 104 mmol/L (ref 96–106)
Creatinine, Ser: 1 mg/dL (ref 0.76–1.27)
GFR calc Af Amer: 90 mL/min/{1.73_m2} (ref >59)
GFR calc non Af Amer: 78 mL/min/{1.73_m2} (ref >59)
Globulin, Total: 2.6 g/dL (ref 1.5–4.5)
Glucose: 91 mg/dL (ref 65–99)
Potassium: 5 mmol/L (ref 3.5–5.2)
Sodium: 141 mmol/L (ref 134–144)
Total Protein: 6.8 g/dL (ref 6.0–8.5)

## 2019-01-13 LAB — LIPID PANEL
Chol/HDL Ratio: 6.5 ratio — ABNORMAL HIGH (ref 0.0–5.0)
Cholesterol, Total: 242 mg/dL — ABNORMAL HIGH (ref 100–199)
HDL: 37 mg/dL — ABNORMAL LOW (ref >39)
LDL Chol Calc (NIH): 167 mg/dL — ABNORMAL HIGH (ref 0–99)
Triglycerides: 205 mg/dL — ABNORMAL HIGH (ref 0–149)
VLDL Cholesterol Cal: 38 mg/dL (ref 5–40)

## 2019-01-13 LAB — HEMOGLOBIN A1C
Est. average glucose Bld gHb Est-mCnc: 117 mg/dL
Hgb A1c MFr Bld: 5.7 % — ABNORMAL HIGH (ref 4.8–5.6)

## 2019-01-13 MED ORDER — ASPIRIN EC 81 MG PO TBEC: 81.0000 mg | DELAYED_RELEASE_TABLET | Freq: Every day | ORAL | 3 refills | Status: DC

## 2019-01-13 NOTE — Patient Instructions (Signed)
Medication Instructions:  Reduce to ASPIRIN 81 MG  Daily  *If you need a refill on your cardiac medications before your next appointment, please call your pharmacy*  Lab Work: Lipid CMP CBC HGBA1C  If you have labs (blood work) drawn today and your tests are completely normal, you will receive your results only by: Marland Kitchen MyChart Message (if you have MyChart) OR . A paper copy in the mail If you have any lab test that is abnormal or we need to change your treatment, we will call you to review the results.  Testing/Procedures: NOT NEEDED   Follow-Up: At [facility], you and your health needs are our priority.  As part of our continuing mission to provide you with exceptional heart care, we have created designated Provider Care Teams.  These Care Teams include your primary Cardiologist (physician) and Advanced Practice Providers (APPs -  Physician Assistants and Nurse Practitioners) who all work together to provide you with the care you need, when you need it.  Your next appointment:   12 months  The format for your next appointment:   In Person  Provider:   Glenetta Hew, MD  Other Instructions  Monitor your blood pressure for 1 week  - call office with readings

## 2019-01-13 NOTE — Progress Notes (Signed)
PCP: Venia Carbon, MD  Clinic Note: Chief Complaint  Patient presents with  . Follow-up    Notes having angina with having to wear mask for prolonged time  . Coronary Artery Disease  . Hypertension    HPI:   Benjamin Robertson is a 68 y.o. male with a PMH below who presents today for annual f/u for CAD  CABG 2001 - PCI 20165  NSTEMI in May 2016 - PCI to Ostial Cx (not bypassed) - Xience DES 3.0 x 15 (3.6 mm)  ELICEO MOGUL was last seen in Oct 2020 -had used 2-3 nitroglycerin.  None exertional dyspnea going up stairs or up an incline.  Was able to tolerate atorvastatin.  Had lost 12 pounds.  Recent Hospitalizations: none   Reviewed  CV studies:    The following studies were reviewed today: (if available, images/films reviewed: From Epic Chart or Care Everywhere) . none:  Interval History:   Benjamin Robertson is here today for routine follow-up basically saying that he is always active.  He splits wood, chops wood and does all kinds of yard work.  No routine exercise per se, just always active.  He has a lot of stress relief since he is 2 years post retirement.  He definitely is not in favor of wearing the mask.  He says if he is to wear the mask for more than hour or so, he will have chest pain and take nitroglycerin.  This is the only time he really has noticed any any chest pain in the last year.  Other than a couple times wearing the mask, he has not had to use nitroglycerin.  He may get short of breath if he overexerts himself, but not otherwise.  No chest pain associated with it.  Cardiovascular review of symptoms: (Summary): positive for - chest pain, shortness of breath and Shortness of breath if he overexerts, or has to do exercise with mask on negative for - edema, irregular heartbeat, orthopnea, palpitations, paroxysmal nocturnal dyspnea, rapid heart rate or Syncope/near syncope, TIA/amaurosis fugax, claudication  The patient does not have symptoms concerning for COVID-19 infection  (fever, chills, cough, or new shortness of breath).  The patient is practicing social distancing. +  Masking, but has trouble wearing for long periods of time - get CP.Marland Kitchen  Wife does Groceries/shopping. Retired x 2 yrs.   REVIEWED OF SYSTEMS   ROS: A comprehensive was performed. Review of Systems  Constitutional: Negative for malaise/fatigue and weight loss.  HENT: Positive for congestion. Negative for nosebleeds.   Respiratory: Positive for shortness of breath (When wearing the mask).   Cardiovascular: Negative for claudication and leg swelling.  Gastrointestinal: Negative for blood in stool and melena.  Genitourinary: Negative for hematuria.  Musculoskeletal: Positive for joint pain (Mostly knees). Negative for falls.  Neurological: Negative for dizziness, tingling, weakness and headaches.  Endo/Heme/Allergies: Positive for environmental allergies.  Psychiatric/Behavioral: Negative for memory loss. The patient is not nervous/anxious and does not have insomnia.   All other systems reviewed and are negative.  I have reviewed and (if needed) personally updated the patient's problem list, medications, allergies, past medical and surgical history, social and family history.   PAST MEDICAL HISTORY   Past Medical History:  Diagnosis Date  . Arthritis    intermittent  . CAD in native artery 1994, 2001   a) 2001 Exertional Angina --> Myoview: Inf infarct and Ant ischemia --> Cath: 95% pLAD, 99% D1, 80% RI, but Cx-OM1&2 OK , & patent  RCA stent --> CABG: b) Cath in 2003 patent grafts, RCA & Cx; c) 08/2009 Myoview: Mod Basal-Mid inferoseptal, Basal-apical inferior Infarct. No ischemia.; d) May 2016: PCI to Ostial Cx (not bypassed) - Xience DES 3.0 x 15 (3.6 mm)  . Childhood asthma   . Hemorrhoids    history of  . History of ST elevation myocardial infarction (STEMI) of inferior wall 1994; 1997   PCI of RCA - redo PCI BMS ML Vision 3.0 mm x 25 mm to RCA in 1997  . Hyperlipidemia LDL goal <70     At goal by Most recent labs October 2013: TC 123, TG 83, HDL 81, LDL 65  . Hypertension   . Ischemic cardiomyopathy 1994, 2003   Echo February 2014: EF 40-45%; moderate HK of anterolateral myocardium.; Grade 1 diastolic dysfunction  . Kidney stones    3 times had lithrotripsy  . Obesity (BMI 30.0-34.9)   . Pneumonia 2014  . S/P CABG x 3 2001   LIMA-LAD, SVG-diagonal, SVG-Ramus Intermedius (RI) - patent by Cath in 2003    PAST SURGICAL HISTORY   Past Surgical History:  Procedure Laterality Date  . CARDIAC CATHETERIZATION  06/06/1999   Recommend CABG  . CARDIAC CATHETERIZATION  10/04/2001   Patent grafts with a 50% lesion in LAD beyond IMA insertion  . CARDIAC CATHETERIZATION N/A 07/31/2014   Procedure: Left Heart Cath and Cors/Grafts Angiography;  Surgeon: Jettie Booze, MD;  Location: [place] [facility];  Service: Cardiovascular;  Laterality: N/A;  . CARDIAC CATHETERIZATION N/A 07/31/2014   Procedure: Coronary Stent Intervention;  Surgeon: Jettie Booze, MD;  Location: [place] [facility];  Service: Cardiovascular: PCI to Ostial Cx (not bypassed) - Xience DES 3.0 x 15 (3.6 mm)  . CAROTID DOPPLER  05/06/2012   Less than 50% diameter reduction of the Lft Subclavian.  . CORONARY ANGIOPLASTY WITH STENT PLACEMENT  01/21/1996   PTCA of RCA-95% lesion/per wife 2015  . CORONARY ARTERY BYPASS GRAFT  06/07/1999   x3. LIMA to distal LAD, SVG to first diag, and SVG to ramus  . HYDROCELE EXCISION Left 02/28/2016   Procedure: HYDROCELECTOMY ADULT;  Surgeon: Kathie Rhodes, MD;  Location: [facility];  Service: Urology;  Laterality: Left;  . LITHOTRIPSY  "2-3 times"   1988/2003/2007  . NM MYOVIEW LTD  08/19/2009   Moderafte perfusion seen in Basal inferoseptal, Basal inferior, Mid inferoseptal, Mid inferiorm, and Apical inferior regions. No ECG changes. EKG negative for ischemia.  . TRANSTHORACIC ECHOCARDIOGRAM  05/06/2012   EF 40-45%, LV cavity moderately dilated, systolic function mild-moderately  reduced, moderate hypokinesis of anterolatewral myocardium.             PCI 07/2014 - Xience DES 3.0 x 15, Ost Cx (90% reduced to 0%)      MEDICATIONS/ALLERGIES   Current Meds  Medication Sig  . atorvastatin (LIPITOR) 40 MG tablet Take 1 tablet (40 mg total) by mouth daily.  . metoprolol tartrate (LOPRESSOR) 50 MG tablet TAKE ONE-HALF TABLET BY  MOUTH TWICE A DAY  . nitroGLYCERIN (NITROLINGUAL) 0.4 MG/SPRAY spray Place 1 spray under the tongue every 5 (five) minutes x 3 doses as needed for chest pain.  . ramipril (ALTACE) 10 MG capsule TAKE 1 CAPSULE BY MOUTH  DAILY  . Tiotropium Bromide Monohydrate (SPIRIVA RESPIMAT) 1.25 MCG/ACT AERS Take 1 puff by mouth daily.  . [DISCONTINUED] albuterol (PROAIR HFA) 108 (90 Base) MCG/ACT inhaler USE 1 TO 2 PUFFS EVERY 4 HOURS AS NEEDED FOR SHORTNESS OF BREATH  . [  DISCONTINUED] ASPIRIN 81 PO Take 325 mg by mouth daily.   . [DISCONTINUED] hydrocortisone (ANUSOL-HC) 25 MG suppository Place 1 suppository (25 mg total) rectally every 12 (twelve) hours. Use BID for 10 days.  . [DISCONTINUED] loratadine (CLARITIN) 10 MG tablet Take 10 mg by mouth daily.  . [DISCONTINUED] naproxen sodium (ALEVE) 220 MG tablet Take 220 mg by mouth 2 (two) times daily as needed.    Allergies  Allergen Reactions  . Brilinta [Ticagrelor] Shortness Of Breath    Having breathing issues  . Advicor [Niacin-Lovastatin Er] Other (See Comments)    Headache, possibly other reactions  . Lipitor [Atorvastatin] Other (See Comments)    Headache, possibly other reactions  . Pravachol [Pravastatin Sodium] Other (See Comments)    Headache, possibly other reactions  . Rosuvastatin Other (See Comments)     40 MG  TABLET--EXTREME FATIGUE  , MUSCLE  PAIN     SOCIAL HISTORY/FAMILY HISTORY   Social History   Tobacco Use  . Smoking status: Current Every Day Smoker    Packs/day: 0.50    Years: 48.00    Pack years: 24.00    Types: Cigarettes  . Smokeless tobacco: Never Used   Substance Use Topics  . Alcohol use: No  . Drug use: No   Social History   Social History Narrative   Married father of 2 with one grandchild. By his wife.   Continues to smoke one half pack a day.   Very physically active at work but no routine exercise.      No living will    Wife would make decisions for him if needed   Would accept resuscitation   Doesn't want prolonged machines or intervention    family history includes Heart attack in his mother; Heart disease in his mother; Lung cancer in his sister; Lymphoma in his sister; Stroke in his sister.   OBJCTIVE -PE, EKG, labs   Wt Readings from Last 3 Encounters:  01/13/19 209 lb (94.8 kg)  01/02/18 205 lb 4 oz (93.1 kg)  12/05/17 209 lb (94.8 kg)    Physical Exam: BP (!) 170/90   Pulse 65   Ht 5\' 8"  (1.727 m)   Wt 209 lb (94.8 kg)   SpO2 97%   BMI 31.78 kg/m  Physical Exam  Constitutional: He is oriented to person, place, and time. He appears well-developed and well-nourished. No distress.  Well-groomed.  Healthy-appearing.  HENT:  Head: Normocephalic and atraumatic.  Neck: Normal range of motion. Neck supple. No hepatojugular reflux and no JVD present. Carotid bruit is not present.  Pulmonary/Chest: Effort normal. No respiratory distress.  Mild interstitial breath sounds, no wheezes, rales, or rhonchi.  Abdominal: Soft. Bowel sounds are normal. He exhibits no distension. There is no abdominal tenderness. There is no rebound.  No HSM.  Musculoskeletal: Normal range of motion.        General: No edema.  Neurological: He is alert and oriented to person, place, and time.  Skin: Skin is warm and dry.  Overall somewhat ruddy complexion but no obvious rashes or lesions.  Mild bruises on the arms.  Psychiatric: He has a normal mood and affect. His behavior is normal. Judgment and thought content normal.  Vitals reviewed.    Adult ECG Report  Rate: 65 ;  Rhythm: normal sinus rhythm and Left atrial enlargement,  borderline rightward axis of 90 degrees.  Cannot exclude anterior MI, age undetermined.;   Narrative Interpretation: Relatively stable EKG.  Recent Labs:  PCP checked  labs last Dec. -- to check today.   ASSESSMENT/PLAN    Problem List Items Addressed This Visit    CAD in native artery - status post CABG x3 after PCI x2 to the RCA (LIMA-LAD, SVG-diagonal SVG-RI - Primary (Chronic)    Has patent grafts to the LAD, D1 and RI.  Now has DES stent in the ostial circumflex. No active angina symptoms, but does note having issues with mouth breathing using the mask.  This is a little unusual in the fact that he is able to walk and do all kinds of other activities without having dyspnea or chest pain.  Probably more related to air hunger than true ischemia.   Angina seems to be pretty stable on just beta-blocker alone.  He did not do well with nitrates in the past.  He still has intermittent symptoms here and there and we may need to consider Ranexa, but currently angina seems to be controlled       Relevant Medications   aspirin EC 81 MG tablet   Other Relevant Orders   EKG 12-Lead (Completed)   CBC (Completed)   Comprehensive metabolic panel (Completed)   Hemoglobin A1c (Completed)   Lipid panel (Completed)   CAD S/P PCI - DES PCI to prox Cx XIENCE ALPINE RX 3.0X15  (Chronic)    Has stents in both the RCA and now ostial circumflex.  Remains on aspirin which we can reduce to 81 mg. Has statin listed, but I do not know if he is taking it.  He is on a beta-blocker and ACE inhibitor.      Relevant Medications   aspirin EC 81 MG tablet   Other Relevant Orders   EKG 12-Lead (Completed)   Hemoglobin A1c (Completed)   Ischemic cardiomyopathy (Chronic)    Mildly reduced EF by both echo and Myoview.  No active heart failure symptoms.  He is on metoprolol and ACE inhibitor.  Not on diuretic.  Seems euvolemic.  I am a little bit is interested with his dyspnea using the mask, but I do suspect  that part of it is just simply lack of getting fresh air.      Relevant Medications   aspirin EC 81 MG tablet   Other Relevant Orders   EKG 12-Lead (Completed)   CBC (Completed)   Comprehensive metabolic panel (Completed)   Lipid panel (Completed)   Hyperlipidemia with target LDL less than 70 (Chronic)    Labs were checked today.  Previously well controlled as of last winter. Lab Results  Component Value Date   CHOL 242 (H) 01/13/2019   HDL 37 (L) 01/13/2019   LDLCALC 167 (H) 01/13/2019   TRIG 205 (H) 01/13/2019   CHOLHDL 6.5 (H) 01/13/2019   Unbelievable that his LDL has gotten that much worse.  Apparently he was not taking his atorvastatin.  We have restarted on now.  Need to reassess, and likely will require additional therapy.  Recheck in 3 to 4 months.  Has had issues with statins in the past.  May need to refer to Cardiovascular Risk Reduction (CVRR)-Lipid Clinic      Relevant Medications   aspirin EC 81 MG tablet   Other Relevant Orders   Hemoglobin A1c (Completed)   Lipid panel (Completed)   History of ST elevation myocardial infarction (STEMI) of inferior wall (Chronic)    Status post bare-metal stent to the RCA back in 97.  Prior inferior inferoseptal infarct confirmed by Myoview.  Mildly reduced EF but otherwise no  real heart failure symptoms of PND orthopnea.  Likely microvascular ischemia.      Relevant Medications   aspirin EC 81 MG tablet   Essential hypertension (Chronic)    Blood pressure is quite high today, but he tells me that at home it was 130/60 this morning.  On my recheck it was a little bit reduced down to 170/90 from 187/80 6 mmHg.  Need to closely monitor blood pressure over the next few weeks.  Discussed with PCP in follow-up.  Would probably recommend converting from metoprolol to carvedilol and or adding a diuretic such as HCTZ or spironolactone.      Relevant Medications   aspirin EC 81 MG tablet   Cigarette smoker one half pack a day or  less (Chronic)    Really not interested in speaking about smoking cessation today. He has cut back, and says maybe he smokes 2 to 3 packs a week.  Unfortunately, he has no interest in quitting.      Obesity (BMI 30.0-34.9) (Chronic)    He has been doing pretty well weight loss, but now with the Covid lockdown issues, he has not been able to be as active as he once was.  Also admits to some dietary indiscretion.      Presence of drug coated stent in left circumflex coronary artery    No longer on Thienopyridine antiplatelet agent.  Is only on aspirin.  Will reduce to 81 mg.      Preventative health care   Relevant Orders   CBC (Completed)   Comprehensive metabolic panel (Completed)   Hemoglobin A1c (Completed)   Lipid panel (Completed)     Due for annual follow-up labs, chemistry, lipids, CBC and A1c - may need additional labs for PCP.   Lab Results  Component Value Date   HGBA1C 5.7 (H) 01/13/2019   Lab Results  Component Value Date   WBC 8.3 01/13/2019   HGB 17.9 (H) 01/13/2019   HCT 51.0 01/13/2019   MCV 92 01/13/2019   PLT 226 01/13/2019   Lab Results  Component Value Date   CREATININE 1.00 01/13/2019   BUN 19 01/13/2019   NA 141 01/13/2019   K 5.0 01/13/2019   CL 104 01/13/2019   CO2 21 01/13/2019     COVID-19 Education: The signs and symptoms of COVID-19 were discussed with the patient and how to seek care for testing (follow up with PCP or arrange E-visit).   The importance of social distancing was discussed today.  I spent a total of 45minutes with the patient and chart review. >  50% of the time was spent in direct patient consultation.  Additional time spent with chart review (studies, outside notes, etc): 4 Total Time: 24  min   Current medicines are reviewed at length with the patient today.  (+/- concerns) n/a   Patient Instructions / Medication Changes & Studies & Tests Ordered   Patient Instructions  Medication Instructions:  Reduce to  ASPIRIN 81 MG  Daily  *If you need a refill on your cardiac medications before your next appointment, please call your pharmacy*  Lab Work: Lipid CMP CBC HGBA1C  If you have labs (blood work) drawn today and your tests are completely normal, you will receive your results only by: Marland Kitchen MyChart Message (if you have MyChart) OR . A paper copy in the mail If you have any lab test that is abnormal or we need to change your treatment, we will call you to review the  results.  Testing/Procedures: NOT NEEDED   Follow-Up: At [facility], you and your health needs are our priority.  As part of our continuing mission to provide you with exceptional heart care, we have created designated Provider Care Teams.  These Care Teams include your primary Cardiologist (physician) and Advanced Practice Providers (APPs -  Physician Assistants and Nurse Practitioners) who all work together to provide you with the care you need, when you need it.  Your next appointment:   12 months  The format for your next appointment:   In Person  Provider:   Glenetta Hew, MD  Other Instructions  Monitor your blood pressure for 1 week  - call office with readings    Studies Ordered:   Orders Placed This Encounter  Procedures  . CBC  . Comprehensive metabolic panel  . Hemoglobin A1c  . Lipid panel  . EKG 12-Lead    Glenetta Hew, M.D., M.S. Interventional Cardiologist   Pager # [phone] Phone # [phone] [address] [place],  [address]   Thank you for choosing Heartcare at [facility]!!

## 2019-01-13 NOTE — Telephone Encounter (Signed)
LM2CB. Will forward to nurse for review.

## 2019-01-14 ENCOUNTER — Encounter: Payer: Self-pay | Admitting: Cardiology

## 2019-01-14 NOTE — Assessment & Plan Note (Signed)
Really not interested in speaking about smoking cessation today. He has cut back, and says maybe he smokes 2 to 3 packs a week.  Unfortunately, he has no interest in quitting.

## 2019-01-14 NOTE — Assessment & Plan Note (Signed)
Has stents in both the RCA and now ostial circumflex.  Remains on aspirin which we can reduce to 81 mg. Has statin listed, but I do not know if he is taking it.  He is on a beta-blocker and ACE inhibitor.

## 2019-01-14 NOTE — Assessment & Plan Note (Signed)
Has patent grafts to the LAD, D1 and RI.  Now has DES stent in the ostial circumflex. No active angina symptoms, but does note having issues with mouth breathing using the mask.  This is a little unusual in the fact that he is able to walk and do all kinds of other activities without having dyspnea or chest pain.  Probably more related to air hunger than true ischemia.   Angina seems to be pretty stable on just beta-blocker alone.  He did not do well with nitrates in the past.  He still has intermittent symptoms here and there and we may need to consider Ranexa, but currently angina seems to be controlled

## 2019-01-14 NOTE — Assessment & Plan Note (Signed)
He has been doing pretty well weight loss, but now with the Covid lockdown issues, he has not been able to be as active as he once was.  Also admits to some dietary indiscretion.

## 2019-01-14 NOTE — Assessment & Plan Note (Signed)
No longer on Thienopyridine antiplatelet agent.  Is only on aspirin.  Will reduce to 81 mg.

## 2019-01-14 NOTE — Assessment & Plan Note (Signed)
Status post bare-metal stent to the RCA back in 97.  Prior inferior inferoseptal infarct confirmed by Myoview.  Mildly reduced EF but otherwise no real heart failure symptoms of PND orthopnea.  Likely microvascular ischemia.

## 2019-01-14 NOTE — Assessment & Plan Note (Addendum)
Labs were checked today.  Previously well controlled as of last winter. Lab Results  Component Value Date   CHOL 242 (H) 01/13/2019   HDL 37 (L) 01/13/2019   LDLCALC 167 (H) 01/13/2019   TRIG 205 (H) 01/13/2019   CHOLHDL 6.5 (H) 01/13/2019   Unbelievable that his LDL has gotten that much worse.  Apparently he was not taking his atorvastatin.  We have restarted on now.  Need to reassess, and likely will require additional therapy.  Recheck in 3 to 4 months.  Has had issues with statins in the past.  May need to refer to Cardiovascular Risk Reduction (CVRR)-Lipid Clinic

## 2019-01-14 NOTE — Assessment & Plan Note (Signed)
Mildly reduced EF by both echo and Myoview.  No active heart failure symptoms.  He is on metoprolol and ACE inhibitor.  Not on diuretic.  Seems euvolemic.  I am a little bit is interested with his dyspnea using the mask, but I do suspect that part of it is just simply lack of getting fresh air.

## 2019-01-14 NOTE — Assessment & Plan Note (Signed)
Blood pressure is quite high today, but he tells me that at home it was 130/60 this morning.  On my recheck it was a little bit reduced down to 170/90 from 187/80 6 mmHg.  Need to closely monitor blood pressure over the next few weeks.  Discussed with PCP in follow-up.  Would probably recommend converting from metoprolol to carvedilol and or adding a diuretic such as HCTZ or spironolactone.

## 2019-01-14 NOTE — Progress Notes (Signed)
CBC shows red blood cell count is a little bit high, but not abnormally so. Chemistry panel looks good with normal kidney and liver function.  Hemoglobin A1c stable at 5.7 Cholesterol level has gotten very bad.  Total cholesterol is up to 242, triglycerides are high at 205, HDL is stable, but LDL is up to 167 from 81.  Need to confirm if he was fasting, and also if he is taking atorvastatin appropriately.  Would like to restart statin, add Zetia 10 mg and recheck lipids in 3 months.  Glenetta Hew, MD

## 2019-01-21 ENCOUNTER — Telehealth: Payer: Self-pay | Admitting: *Deleted

## 2019-01-21 DIAGNOSIS — I255 Ischemic cardiomyopathy: Secondary | ICD-10-CM

## 2019-01-21 DIAGNOSIS — E785 Hyperlipidemia, unspecified: Secondary | ICD-10-CM

## 2019-01-21 DIAGNOSIS — Z79899 Other long term (current) drug therapy: Secondary | ICD-10-CM

## 2019-01-21 DIAGNOSIS — I2510 Atherosclerotic heart disease of native coronary artery without angina pectoris: Secondary | ICD-10-CM

## 2019-01-21 MED ORDER — EZETIMIBE 10 MG PO TABS: 10.0000 mg | ORAL_TABLET | Freq: Every day | ORAL | 6 refills | Status: DC

## 2019-01-21 MED ORDER — ATORVASTATIN CALCIUM 40 MG PO TABS: 40.0000 mg | ORAL_TABLET | Freq: Every day | ORAL | 6 refills | Status: DC

## 2019-01-21 NOTE — Telephone Encounter (Signed)
-----   Message from Leonie Man, MD sent at 01/14/2019  9:45 PM EST ----- CBC shows red blood cell count is a little bit high, but not abnormally so. Chemistry panel looks good with normal kidney and liver function.  Hemoglobin A1c stable at 5.7 Cholesterol level has gotten very bad.  Total cholesterol is up to 242, triglycerides are high at 205, HDL is stable, but LDL is up to 167 from 81.  Need to confirm if he was fasting, and also if he is taking atorvastatin appropriately.  Would like to restart statin, add Zetia 10 mg and recheck lipids in 3 months.  Glenetta Hew, MD

## 2019-01-21 NOTE — Telephone Encounter (Signed)
Spoke to wife . She states patient  Was fasting day of  Testing. She is not sure if patient has been taking atorvastatin 40 mg . But she will check and have restart medication.  aware to start  Taking zetia 10 mg   Recheck labs in  @3  months ( feb/march 2021)   zetia sent to local pharmacy and labslip maieed to patient.     she also gave RN  Blood pressure reading for the last week as requested.  NOV 2  124/73  P-63  149/78   P 73 NOV 3  147/82   P 54   140/76   P 63 NOV 4  150/77  P 66   150/74  P 67 NOV 5   159/86  P59    123/67 P 65 NOV 6   155/79  P 58     166/86  P 61 NOV 7    141/71   P67     125/60  P 66 NOV 8     127/70    P 63      112/60     P 61  WIFE IS AWARE will  DEFER TO Dr Ellyn Hack

## 2019-02-24 ENCOUNTER — Telehealth: Payer: Self-pay | Admitting: Cardiology

## 2019-02-24 NOTE — Telephone Encounter (Signed)
Pt c/o medication issue:  1. Name of Medication: ezetimibe (ZETIA) 10 MG tablet atorvastatin (LIPITOR) 40 MG tablet  2. How are you currently taking this medication (dosage and times per day)? 10mg , once daily  3. Are you having a reaction (difficulty breathing--STAT)?  no  4. What is your medication issue? He is having very bad muscle cramps, in his legs, arms, hands.

## 2019-02-24 NOTE — Telephone Encounter (Signed)
I think at this point, we need to just have him follow-up with CVRR to discuss how to treat lipids.  Glenetta Hew, MD

## 2019-02-24 NOTE — Telephone Encounter (Signed)
I spoke with pt's wife. Pt was recently started on Zetia. He also resumed Atorvastatin. Since taking these he has developed muscle cramps. Wife describes as severe. Has been trying mustard to see if this helps but he is continuing to have cramps.  Wife is "prettty sure" pt was not taking atorvastatin prior to last office visit and had stopped due to cramps.  She thinks Zetia is making cramps worse. I told her to stop Zetia and Atorvastatin for now and I would send message to Dr Ellyn Hack for review/recommendations.

## 2019-02-25 NOTE — Telephone Encounter (Signed)
Can you please schedule him for CVRR appointment in January.  No rush.  If he wants something before end of year you can try the [facility] [address] CVRR schedule.

## 2019-02-26 NOTE — Telephone Encounter (Signed)
Advised Ms. Benjamin Robertson that he can try Cholestoff by [employer] for now.  Pt has appointment end of January with CVRR

## 2019-02-26 NOTE — Telephone Encounter (Signed)
Spoke with Benjamin Robertson to schedule Pharm D appointment to discuss lipid treatment.  She is asking if there are any natural medications [name] could take for his cholesterol

## 2019-03-03 ENCOUNTER — Encounter: Payer: Self-pay | Admitting: Internal Medicine

## 2019-03-03 ENCOUNTER — Other Ambulatory Visit: Payer: Self-pay | Admitting: Cardiology

## 2019-03-03 ENCOUNTER — Other Ambulatory Visit: Payer: Self-pay

## 2019-03-03 ENCOUNTER — Ambulatory Visit (INDEPENDENT_AMBULATORY_CARE_PROVIDER_SITE_OTHER): Payer: Medicare Other | Admitting: Internal Medicine

## 2019-03-03 VITALS — BP 132/82 | HR 75 | Temp 98.0°F | Ht 67.0 in | Wt 206.0 lb

## 2019-03-03 DIAGNOSIS — I25119 Atherosclerotic heart disease of native coronary artery with unspecified angina pectoris: Secondary | ICD-10-CM | POA: Insufficient documentation

## 2019-03-03 DIAGNOSIS — J449 Chronic obstructive pulmonary disease, unspecified: Secondary | ICD-10-CM

## 2019-03-03 DIAGNOSIS — Z23 Encounter for immunization: Secondary | ICD-10-CM | POA: Diagnosis not present

## 2019-03-03 DIAGNOSIS — I10 Essential (primary) hypertension: Secondary | ICD-10-CM

## 2019-03-03 DIAGNOSIS — Z0000 Encounter for general adult medical examination without abnormal findings: Secondary | ICD-10-CM | POA: Diagnosis not present

## 2019-03-03 DIAGNOSIS — I255 Ischemic cardiomyopathy: Secondary | ICD-10-CM

## 2019-03-03 DIAGNOSIS — Z7189 Other specified counseling: Secondary | ICD-10-CM

## 2019-03-03 MED ORDER — NITROGLYCERIN 0.4 MG/SPRAY TL SOLN: 1.0000 | 2 refills | Status: DC | PRN

## 2019-03-03 NOTE — Assessment & Plan Note (Signed)
I have personally reviewed the Medicare Annual Wellness questionnaire and have noted 1. The patient's medical and social history 2. Their use of alcohol, tobacco or illicit drugs 3. Their current medications and supplements 4. The patient's functional ability including ADL's, fall risks, home safety risks and hearing or visual             impairment. 5. Diet and physical activities 6. Evidence for depression or mood disorders  The patients weight, height, BMI and visual acuity have been recorded in the chart I have made referrals, counseling and provided education to the patient based review of the above and I have provided the pt with a written personalized care plan for preventive services.  I have provided you with a copy of your personalized plan for preventive services. Please take the time to review along with your updated medication list.  Pneumovax today Yearly flu vaccine Consider repeat colon in 2029 Consider PSA again next year Discussed cigarette cessation---vital!! Stays fairly active

## 2019-03-03 NOTE — Assessment & Plan Note (Signed)
No clinical CHF

## 2019-03-03 NOTE — Assessment & Plan Note (Signed)
Uses nitro once in a while Some of his pain may be muscular--seems less since off statin

## 2019-03-03 NOTE — Telephone Encounter (Signed)
Requested Prescriptions   Signed Prescriptions Disp Refills  . nitroGLYCERIN (NITROLINGUAL) 0.4 MG/SPRAY spray 4.9 g 2    Sig: Place 1 spray under the tongue every 5 (five) minutes x 3 doses as needed for chest pain.    Authorizing Provider: Leonie Man    Ordering User: Britt Bottom

## 2019-03-03 NOTE — Telephone Encounter (Signed)
*  STAT* If patient is at the pharmacy, call can be transferred to refill team.   1. Which medications need to be refilled? (please list name of each medication and dose if known) nitroGLYCERIN (NITROLINGUAL) 0.4 MG/SPRAY spray  2. Which pharmacy/location (including street and city if local pharmacy) is medication to be sent to? [id number] [place] [place] - [address]  3. Do they need a 30 day or 90 day supply? N/A

## 2019-03-03 NOTE — Assessment & Plan Note (Signed)
See social history 

## 2019-03-03 NOTE — Telephone Encounter (Signed)
Wife is aware -- about cholestoff She states she picked up the medication  And patient is taking it 2 tablets daily.

## 2019-03-03 NOTE — Assessment & Plan Note (Signed)
Good symptom relief with spiriva Again discussed cessation---said he thinks Dr Ellyn Hack is going to discuss chantix

## 2019-03-03 NOTE — Addendum Note (Signed)
Addended by: Pilar Grammes on: 03/03/2019 04:45 PM   Modules accepted: Orders

## 2019-03-03 NOTE — Progress Notes (Signed)
Subjective:    Patient ID: Benjamin Robertson, male    DOB: 1950-11-14, 68 y.o.   MRN: [id number]  HPI Here for Medicare wellness visit and follow up of chronic health conditions Reviewed form and advanced directives Reviewed other doctors No alcohol Still smokes a little---discussed total cessation (down to 1/2 PPD). Dr Ellyn Hack asked him not to use nicotine replacement Trying to walk regularly--cuts and splits wood, etc Vision and hearing are okay No falls No sig depression or anhedonia Independent with instrumental ADLs No sig memory issues  He feels the spiriva has really helps No rescue inhaler now  No recent chest pain Has used the nitro sporadically Actually has needed it if he needs a mask for a prolonged time Felt the statin was causing this and bad cramps (all gone since stopping it) Now just on zetia No palpitations No dizziness or syncope No edema  Current Outpatient Medications on File Prior to Visit  Medication Sig Dispense Refill  . aspirin EC 81 MG tablet Take 1 tablet (81 mg total) by mouth daily. 90 tablet 3  . ezetimibe (ZETIA) 10 MG tablet Take 1 tablet (10 mg total) by mouth daily. 30 tablet 6  . metoprolol tartrate (LOPRESSOR) 50 MG tablet TAKE ONE-HALF TABLET BY  MOUTH TWICE A DAY 90 tablet 3  . nitroGLYCERIN (NITROLINGUAL) 0.4 MG/SPRAY spray Place 1 spray under the tongue every 5 (five) minutes x 3 doses as needed for chest pain. 4.9 g 2  . Plant Sterols and Stanols (CHOLESTOFF PO) Take by mouth.    . ramipril (ALTACE) 10 MG capsule TAKE 1 CAPSULE BY MOUTH  DAILY 90 capsule 3  . Tiotropium Bromide Monohydrate (SPIRIVA RESPIMAT) 1.25 MCG/ACT AERS Take 1 puff by mouth daily. 12 g 3   No current facility-administered medications on file prior to visit.    Allergies  Allergen Reactions  . Brilinta [Ticagrelor] Shortness Of Breath    Having breathing issues  . Advicor [Niacin-Lovastatin Er] Other (See Comments)    Headache, possibly other reactions  .  Lipitor [Atorvastatin] Other (See Comments)    Headache, possibly other reactions  . Pravachol [Pravastatin Sodium] Other (See Comments)    Headache, possibly other reactions  . Rosuvastatin Other (See Comments)     40 MG  TABLET--EXTREME FATIGUE  , MUSCLE  PAIN     Past Medical History:  Diagnosis Date  . Arthritis    intermittent  . CAD in native artery 1994, 2001   a) 2001 Exertional Angina --> Myoview: Inf infarct and Ant ischemia --> Cath: 95% pLAD, 99% D1, 80% RI, but Cx-OM1&2 OK , & patent RCA stent --> CABG: b) Cath in 2003 patent grafts, RCA & Cx; c) 08/2009 Myoview: Mod Basal-Mid inferoseptal, Basal-apical inferior Infarct. No ischemia.; d) May 2016: PCI to Ostial Cx (not bypassed) - Xience DES 3.0 x 15 (3.6 mm)  . Childhood asthma   . Hemorrhoids    history of  . History of ST elevation myocardial infarction (STEMI) of inferior wall 1994; 1997   PCI of RCA - redo PCI BMS ML Vision 3.0 mm x 25 mm to RCA in 1997  . Hyperlipidemia LDL goal <70    At goal by Most recent labs October 2013: TC 123, TG 83, HDL 81, LDL 65  . Hypertension   . Ischemic cardiomyopathy 1994, 2003   Echo February 2014: EF 40-45%; moderate HK of anterolateral myocardium.; Grade 1 diastolic dysfunction  . Kidney stones    3 times had  lithrotripsy  . Obesity (BMI 30.0-34.9)   . Pneumonia 2014  . S/P CABG x 3 2001   LIMA-LAD, SVG-diagonal, SVG-Ramus Intermedius (RI) - patent by Cath in 2003    Past Surgical History:  Procedure Laterality Date  . CARDIAC CATHETERIZATION  06/06/1999   Recommend CABG  . CARDIAC CATHETERIZATION  10/04/2001   Patent grafts with a 50% lesion in LAD beyond IMA insertion  . CARDIAC CATHETERIZATION N/A 07/31/2014   Procedure: Left Heart Cath and Cors/Grafts Angiography;  Surgeon: Jettie Booze, MD;  Location: [facility];  Service: Cardiovascular;  Laterality: N/A;  . CARDIAC CATHETERIZATION N/A 07/31/2014   Procedure: Coronary Stent Intervention;  Surgeon: Jettie Booze, MD;  Location: [facility];  Service: Cardiovascular: PCI to Ostial Cx (not bypassed) - Xience DES 3.0 x 15 (3.6 mm)  . CAROTID DOPPLER  05/06/2012   Less than 50% diameter reduction of the Lft Subclavian.  . CORONARY ANGIOPLASTY WITH STENT PLACEMENT  01/21/1996   PTCA of RCA-95% lesion/per wife 2015  . CORONARY ARTERY BYPASS GRAFT  06/07/1999   x3. LIMA to distal LAD, SVG to first diag, and SVG to ramus  . HYDROCELE EXCISION Left 02/28/2016   Procedure: HYDROCELECTOMY ADULT;  Surgeon: Kathie Rhodes, MD;  Location: [facility];  Service: Urology;  Laterality: Left;  . LITHOTRIPSY  "2-3 times"   1988/2003/2007  . NM MYOVIEW LTD  08/19/2009   Moderafte perfusion seen in Basal inferoseptal, Basal inferior, Mid inferoseptal, Mid inferiorm, and Apical inferior regions. No ECG changes. EKG negative for ischemia.  . TRANSTHORACIC ECHOCARDIOGRAM  05/06/2012   EF 40-45%, LV cavity moderately dilated, systolic function mild-moderately reduced, moderate hypokinesis of anterolatewral myocardium.    Family History  Problem Relation Age of Onset  . Heart attack Mother   . Heart disease Mother   . Stroke Sister        during cancer Rx  . Lymphoma Sister   . Lung cancer Sister     Social History   Socioeconomic History  . Marital status: Married    Spouse name: Not on file  . Number of children: 2  . Years of education: Not on file  . Highest education level: Not on file  Occupational History  . 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 Smoking status: Current Every Day Smoker    Packs/day: 0.50    Years: 48.00    Pack years: 24.00    Types: Cigarettes  . Smokeless tobacco: Never Used  Substance and Sexual Activity  . Alcohol use: No  . Drug use: No  . Sexual activity: Never  Other Topics Concern  . Not on file  Social History Narrative   Married father of 2 with one grandchild. By his wife.   Continues to smoke one half pack a day.    Very physically active at work but no routine exercise.      No living will    Wife would make decisions for him if needed   Would accept resuscitation   Doesn't want prolonged machines or intervention   Social Determinants of Health   Financial Resource Strain:   . Difficulty of Paying Living Expenses: Not on file  Food Insecurity:   . Worried About Charity fundraiser in the Last Year: Not on file  . Ran Out of Food in the Last Year: Not on file  Transportation Needs:   . Lack of Transportation (Medical):  Not on file  . Lack of Transportation (Non-Medical): Not on file  Physical Activity:   . Days of Exercise per Week: Not on file  . Minutes of Exercise per Session: Not on file  Stress:   . Feeling of Stress : Not on file  Social Connections:   . Frequency of Communication with Friends and Family: Not on file  . Frequency of Social Gatherings with Friends and Family: Not on file  . Attends Religious Services: Not on file  . Active Member of Clubs or Organizations: Not on file  . Attends Archivist Meetings: Not on file  . Marital Status: Not on file  Intimate Partner Violence:   . Fear of Current or Ex-Partner: Not on file  . Emotionally Abused: Not on file  . Physically Abused: Not on file  . Sexually Abused: Not on file   Review of Systems  No headaches Appetite is good Weight is stable Sleeps well Wears seat belt Teeth are fine---overdue for dentist with COVID No rash or suspicious lesions No heartburn or dysphagia Bowels are fine--no blood Urine stream is fine. No dribbling. Stable nocturia x 2 No sig back or joint pains      Objective:   Physical Exam  Constitutional: He is oriented to person, place, and time. He appears well-developed. No distress.  HENT:  Mouth/Throat: Oropharynx is clear and moist. No oropharyngeal exudate.  Neck: No thyromegaly present.  Cardiovascular: Normal rate, regular rhythm, normal heart sounds and intact distal  pulses. Exam reveals no gallop.  No murmur heard. Click towards apex  Respiratory: Effort normal. No respiratory distress. He has no wheezes. He has no rales.  Decreased breath sounds but clear  GI: Soft. There is no abdominal tenderness.  Musculoskeletal:        General: No tenderness or edema.  Lymphadenopathy:    He has no cervical adenopathy.  Neurological: He is alert and oriented to person, place, and time.  President--- "Dwaine Deter, [place]" [phone] [id number] Recall 2/3  Didn't finish high school  Skin: No rash noted. No erythema.  Psychiatric: He has a normal mood and affect. His behavior is normal.           Assessment & Plan:

## 2019-03-03 NOTE — Progress Notes (Signed)
Hearing Screening   Method: Audiometry   125Hz  250Hz  500Hz  1000Hz  2000Hz  3000Hz  4000Hz  6000Hz  8000Hz   Right ear:   20 25 20   0    Left ear:   20 25 20   0      Visual Acuity Screening   Right eye Left eye Both eyes  Without correction: 20/25 20/25 20/25   With correction:

## 2019-03-03 NOTE — Assessment & Plan Note (Signed)
BP Readings from Last 3 Encounters:  03/03/19 132/82  01/13/19 (!) 170/90  01/02/18 132/82   Good control here No changes

## 2019-04-10 ENCOUNTER — Ambulatory Visit: Payer: Medicare Other

## 2019-04-22 ENCOUNTER — Other Ambulatory Visit: Payer: Self-pay | Admitting: Cardiology

## 2019-06-17 DIAGNOSIS — Z79899 Other long term (current) drug therapy: Secondary | ICD-10-CM | POA: Diagnosis not present

## 2019-06-17 DIAGNOSIS — I255 Ischemic cardiomyopathy: Secondary | ICD-10-CM | POA: Diagnosis not present

## 2019-06-17 DIAGNOSIS — I2510 Atherosclerotic heart disease of native coronary artery without angina pectoris: Secondary | ICD-10-CM | POA: Diagnosis not present

## 2019-06-17 DIAGNOSIS — E785 Hyperlipidemia, unspecified: Secondary | ICD-10-CM | POA: Diagnosis not present

## 2019-06-17 LAB — HEPATIC FUNCTION PANEL
ALT: 12 IU/L (ref 0–44)
AST: 15 IU/L (ref 0–40)
Albumin: 4.2 g/dL (ref 3.8–4.8)
Alkaline Phosphatase: 83 IU/L (ref 39–117)
Bilirubin Total: 0.2 mg/dL (ref 0.0–1.2)
Bilirubin, Direct: 0.06 mg/dL (ref 0.00–0.40)
Total Protein: 7 g/dL (ref 6.0–8.5)

## 2019-06-17 LAB — LIPID PANEL
Chol/HDL Ratio: 7.3 ratio — ABNORMAL HIGH (ref 0.0–5.0)
Cholesterol, Total: 232 mg/dL — ABNORMAL HIGH (ref 100–199)
HDL: 32 mg/dL — ABNORMAL LOW (ref >39)
LDL Chol Calc (NIH): 152 mg/dL — ABNORMAL HIGH (ref 0–99)
Triglycerides: 259 mg/dL — ABNORMAL HIGH (ref 0–149)
VLDL Cholesterol Cal: 48 mg/dL — ABNORMAL HIGH (ref 5–40)

## 2019-06-20 ENCOUNTER — Telehealth: Payer: Self-pay | Admitting: Cardiology

## 2019-06-20 NOTE — Telephone Encounter (Signed)
Called to schedule Lipid consult with the Pharm D (per Dr. Ellyn Hack) --no answer / no voice mail

## 2019-07-10 ENCOUNTER — Other Ambulatory Visit: Payer: Self-pay

## 2019-07-10 ENCOUNTER — Ambulatory Visit (INDEPENDENT_AMBULATORY_CARE_PROVIDER_SITE_OTHER): Payer: Medicare Other | Admitting: Pharmacist Clinician (PhC)/ Clinical Pharmacy Specialist

## 2019-07-10 DIAGNOSIS — E785 Hyperlipidemia, unspecified: Secondary | ICD-10-CM

## 2019-07-10 MED ORDER — EZETIMIBE-SIMVASTATIN 10-40 MG PO TABS: 1.0000 | ORAL_TABLET | Freq: Every day | ORAL | 6 refills | Status: DC

## 2019-07-10 NOTE — Assessment & Plan Note (Signed)
Patient with ASCVD currently only taking Cholestoff, which showed no significant drop in LDL.  Reviewed information on PCSK-9 inhibitor including mechanism of action, side effects and expected results.  Patient not unwilling to try, but would prefer to go back on Vytorin 10/40 as he had tolerated it several years ago.  He is agreeable to adding Repatha if LDL not to goal after 3 months of Vytorin.  Will have him repeat labs in 3 months.  He is aware to call if cannot tolerate the vytorn, so we can get approval for the [place].   Also reviewed triglyceride levels and encouraged him to adjust diet accordingly.

## 2019-07-10 NOTE — Patient Instructions (Addendum)
Your Results:             Your most recent labs Goal  Total Cholesterol 232 < 200  Triglycerides 259 < 150  HDL (happy/good cholesterol) 32 > 40  LDL (lousy/bad cholesterol 152 < 70   < 100   Medication changes:  Start Vytorin 10/40 mg once daily (simvastatin/ezetimibe).  If you do well with this we can send a prescription to your mail order pharmacy.   After 3 months we will repeat labs.  At that time if you are not at goal (LDL < 70) we will add Repatha 140 mg every 14 days.   You can continue with the [place] if you wish.    If you are not able to tolerate the Vytorin, please let us know and we can start you on the Repatha sooner.    Lab orders:  Repeat labs after 3 months on medication.  Patient Assistance:  The [employer] Well foundation offers assistance to help pay for medication copays.  They will cover copays for all cholesterol lowering meds, including statins, fibrates, omega-3 oils, ezetimibe, Repatha, Praluent, Nexletol, Nexlizet.  The cards are usually good for $2,500 or 12 months, whichever comes first. 1. Go to healthwellfoundation.org 2. Click on "Apply Now" 3. Answer questions as to whom is applying (patient or representative) 4. Your disease fund will be "hypercholesterolemia - Medicare access" 5. They will ask questions about finances and which medications you are taking for cholesterol 6. When you submit, the approval is usually within minutes.  You will need to print the card information from the site 7. You will need to show this information to your pharmacy, they will bill your Medicare Part D plan first -then bill Health Well --for the copay.   You can also call them at [phone], although the hold times can be quite long.    Please call to let us know if you need prescription sent to mail order or if unable to tolerate.   Aeon Koors/Raquel at [phone] Thank you for choosing [facility]

## 2019-07-10 NOTE — Progress Notes (Signed)
07/10/2019 Benjamin Robertson 1950-09-07 [id number]   HPI:  Benjamin Robertson is a 69 y.o. male patient of Dr Ellyn Hack, who presents today for a lipid clinic evaluation.  See pertinent past medical history below.  He was last seen by Dr. Ellyn Hack in November.  His LDL cholesterol has been elevated for some time, as he has not been able to tolerate statins due to myalgias.    Today he states that he took one for many years, given to him originally by Dr. Tami Robertson (who left [facility] in [facility]) and never had a problem with it.  He would very much like to go back to that medication.  It is noted in his chart that since then he did take simvastatin by itself and had myalgias, and his wife recalls that he did not do welll.  However he is insistent that he will be fine back on the Vytorin.    Past Medical History: ASCVD NSTEMI May 2016 with DES to ostial Cx, RCA  hypertension On ramipril and metoprolol  HFrEF Most recent lexiscan with  LVEF at 45-54%    Current Medications: ezetimibe 10 mg, cholestoff 2 tabs daily  Cholesterol Goals: LDL < 70   Intolerant/previously tried: atorvastatin, rosuvastatin caused myalgias  Family history: mother died from [place] at 43; father living at 65; 1/8 siblings with heart disease; 2 children, no issues with cholesterol/heart disease yeat  Social history:  2-3 ppw smoker, not interested in quitting  Diet: mostly home cooked, occasional fried, but mostly grilled; beans; snacks on nuts  Exercise:  Has 8-9 acres, always out working on land, garden  Labs: 01/2019 TC 242, TG 205, HDL 37, LDL 167  06/2019:  TC 232, TG 259, HDL 32, LDL 152 (on Cholestoff and ezetimibe)   Current Outpatient Medications  Medication Sig Dispense Refill  . aspirin EC 81 MG tablet Take 1 tablet (81 mg total) by mouth daily. 90 tablet 3  . metoprolol tartrate (LOPRESSOR) 50 MG tablet TAKE ONE-HALF TABLET BY  MOUTH TWICE A DAY 90 tablet 1  . naproxen sodium (ALEVE) 220 MG tablet Take 220 mg by mouth.    .  nitroGLYCERIN (NITROLINGUAL) 0.4 MG/SPRAY spray Place 1 spray under the tongue every 5 (five) minutes x 3 doses as needed for chest pain. 4.9 g 2  . Plant Sterols and Stanols (CHOLESTOFF PO) Take by mouth.    . ramipril (ALTACE) 10 MG capsule TAKE 1 CAPSULE BY MOUTH  DAILY 90 capsule 3  . Tiotropium Bromide Monohydrate (SPIRIVA RESPIMAT) 1.25 MCG/ACT AERS Take 1 puff by mouth daily. 12 g 3  . ezetimibe-simvastatin (VYTORIN) 10-40 MG tablet Take 1 tablet by mouth daily. 30 tablet 6   No current facility-administered medications for this visit.    Allergies  Allergen Reactions  . Brilinta [Ticagrelor] Shortness Of Breath    Having breathing issues  . Advicor [Niacin-Lovastatin Er] Other (See Comments)    Headache, possibly other reactions  . Lipitor [Atorvastatin] Other (See Comments)    Headache, possibly other reactions  . Pravachol [Pravastatin Sodium] Other (See Comments)    Headache, possibly other reactions  . Rosuvastatin Other (See Comments)     40 MG  TABLET--EXTREME FATIGUE  , MUSCLE  PAIN     Past Medical History:  Diagnosis Date  . Arthritis    intermittent  . CAD in native artery 1994, 2001   a) 2001 Exertional Angina --> Myoview: Inf infarct and Ant ischemia --> Cath: 95% pLAD, 99% D1, 80% RI,  but Cx-OM1&2 OK , & patent RCA stent --> CABG: b) Cath in 2003 patent grafts, RCA & Cx; c) 08/2009 Myoview: Mod Basal-Mid inferoseptal, Basal-apical inferior Infarct. No ischemia.; d) May 2016: PCI to Ostial Cx (not bypassed) - Xience DES 3.0 x 15 (3.6 mm)  . Childhood asthma   . Hemorrhoids    history of  . History of ST elevation myocardial infarction (STEMI) of inferior wall 1994; 1997   PCI of RCA - redo PCI BMS ML Vision 3.0 mm x 25 mm to RCA in 1997  . Hyperlipidemia LDL goal <70    At goal by Most recent labs October 2013: TC 123, TG 83, HDL 81, LDL 65  . Hypertension   . Ischemic cardiomyopathy 1994, 2003   Echo February 2014: EF 40-45%; moderate HK of anterolateral  myocardium.; Grade 1 diastolic dysfunction  . Kidney stones    3 times had lithrotripsy  . Obesity (BMI 30.0-34.9)   . Pneumonia 2014  . S/P CABG x 3 2001   LIMA-LAD, SVG-diagonal, SVG-Ramus Intermedius (RI) - patent by Cath in 2003    Blood pressure (!) 172/78, pulse (!) 58, temperature 98.3 F (36.8 C), resp. rate 14, height 5\' 8"  (1.727 m), weight 210 lb (95.3 kg), SpO2 97 %.   Hyperlipidemia with target LDL less than 70 Patient with ASCVD currently only taking Cholestoff, which showed no significant drop in LDL.  Reviewed information on PCSK-9 inhibitor including mechanism of action, side effects and expected results.  Patient not unwilling to try, but would prefer to go back on Vytorin 10/40 as he had tolerated it several years ago.  He is agreeable to adding Repatha if LDL not to goal after 3 months of Vytorin.  Will have him repeat labs in 3 months.  He is aware to call if cannot tolerate the vytorn, so we can get approval for the [place].   Also reviewed triglyceride levels and encouraged him to adjust diet accordingly.     Tommy Medal PharmD CPP [facility] [address] [place], [place] [address] [phone]

## 2019-07-22 ENCOUNTER — Ambulatory Visit (INDEPENDENT_AMBULATORY_CARE_PROVIDER_SITE_OTHER): Payer: Medicare Other | Admitting: Internal Medicine

## 2019-07-22 ENCOUNTER — Encounter: Payer: Self-pay | Admitting: Internal Medicine

## 2019-07-22 ENCOUNTER — Other Ambulatory Visit: Payer: Self-pay | Admitting: Internal Medicine

## 2019-07-22 ENCOUNTER — Telehealth: Payer: Self-pay

## 2019-07-22 ENCOUNTER — Ambulatory Visit
Admission: RE | Admit: 2019-07-22 | Discharge: 2019-07-22 | Disposition: A | Discharge status: Home / Self Care | Payer: Medicare Other | Source: Ambulatory Visit | Attending: Internal Medicine | Admitting: Internal Medicine

## 2019-07-22 ENCOUNTER — Other Ambulatory Visit: Payer: Self-pay

## 2019-07-22 VITALS — BP 138/78 | HR 64 | Temp 98.0°F | Ht 68.0 in | Wt 202.0 lb

## 2019-07-22 DIAGNOSIS — R3129 Other microscopic hematuria: Secondary | ICD-10-CM

## 2019-07-22 DIAGNOSIS — N132 Hydronephrosis with renal and ureteral calculous obstruction: Secondary | ICD-10-CM | POA: Diagnosis not present

## 2019-07-22 DIAGNOSIS — R109 Unspecified abdominal pain: Secondary | ICD-10-CM | POA: Diagnosis not present

## 2019-07-22 LAB — RENAL FUNCTION PANEL
Albumin: 4.1 g/dL (ref 3.5–5.2)
BUN: 21 mg/dL (ref 6–23)
CO2: 27 mEq/L (ref 19–32)
Calcium: 9.7 mg/dL (ref 8.4–10.5)
Chloride: 99 mEq/L (ref 96–112)
Creatinine, Ser: 1.46 mg/dL (ref 0.40–1.50)
GFR: 48.01 mL/min — ABNORMAL LOW (ref >60.00)
Glucose, Bld: 118 mg/dL — ABNORMAL HIGH (ref 70–99)
Phosphorus: 3.7 mg/dL (ref 2.3–4.6)
Potassium: 4.5 mEq/L (ref 3.5–5.1)
Sodium: 132 mEq/L — ABNORMAL LOW (ref 135–145)

## 2019-07-22 LAB — POC URINALSYSI DIPSTICK (AUTOMATED)
Bilirubin, UA: NEGATIVE
Glucose, UA: NEGATIVE
Ketones, UA: NEGATIVE
Nitrite, UA: NEGATIVE
Protein, UA: NEGATIVE
Spec Grav, UA: 1.015 (ref 1.010–1.025)
Urobilinogen, UA: 0.2 E.U./dL
pH, UA: 6 (ref 5.0–8.0)

## 2019-07-22 LAB — CBC
HCT: 51.3 % (ref 39.0–52.0)
Hemoglobin: 17.7 g/dL — ABNORMAL HIGH (ref 13.0–17.0)
MCHC: 34.5 g/dL (ref 30.0–36.0)
MCV: 93.9 fl (ref 78.0–100.0)
Platelets: 224 10*3/uL (ref 150.0–400.0)
RBC: 5.46 Mil/uL (ref 4.22–5.81)
RDW: 13.5 % (ref 11.5–15.5)
WBC: 10.5 10*3/uL (ref 4.0–10.5)

## 2019-07-22 MED ORDER — TAMSULOSIN HCL 0.4 MG PO CAPS
0.4000 mg | ORAL_CAPSULE | Freq: Every day | ORAL | 3 refills | Status: DC
Start: 2019-07-22 — End: 2019-10-06

## 2019-07-22 NOTE — Assessment & Plan Note (Signed)
Further support that his symptoms are stone related Will get CT and decide on action based on that

## 2019-07-22 NOTE — Progress Notes (Signed)
Subjective:    Patient ID: Benjamin Robertson, male    DOB: [birth year], 69 y.o.   MRN: [id number]  HPI Here due to abdominal and flank pain This visit occurred during the SARS-CoV-2 public health emergency.  Safety protocols were in place, including screening questions prior to the visit, additional usage of staff PPE, and extensive cleaning of exam room while observing appropriate contact time as indicated for disinfecting solutions.   Having RLQ pain since last night Was out bush hogging and lifting 50# bags of feed Then came in and started with urinary urgency (and very little output) No dysuria or hematuria Pain radiated around back Had cold sweat Reminded him of long ago kidney stones  Went to sleep (didn't want to go to ER) Pain persisted to midnight--got up and drank more fluids and took tylenol Up like 10 times with urgency---always just a little bit Right scrotal pain yesterday also--this is also better  Had normal stream this morning Pain is gone  Current Outpatient Medications on File Prior to Visit  Medication Sig Dispense Refill  . aspirin EC 81 MG tablet Take 1 tablet (81 mg total) by mouth daily. 90 tablet 3  . ezetimibe-simvastatin (VYTORIN) 10-40 MG tablet Take 1 tablet by mouth daily. 30 tablet 6  . metoprolol tartrate (LOPRESSOR) 50 MG tablet TAKE ONE-HALF TABLET BY  MOUTH TWICE A DAY 90 tablet 1  . naproxen sodium (ALEVE) 220 MG tablet Take 220 mg by mouth.    . nitroGLYCERIN (NITROLINGUAL) 0.4 MG/SPRAY spray Place 1 spray under the tongue every 5 (five) minutes x 3 doses as needed for chest pain. 4.9 g 2  . Plant Sterols and Stanols (CHOLESTOFF PO) Take by mouth.    . ramipril (ALTACE) 10 MG capsule TAKE 1 CAPSULE BY MOUTH  DAILY 90 capsule 3  . Tiotropium Bromide Monohydrate (SPIRIVA RESPIMAT) 1.25 MCG/ACT AERS Take 1 puff by mouth daily. 12 g 3   No current facility-administered medications on file prior to visit.    Allergies  Allergen Reactions  . Brilinta  [Ticagrelor] Shortness Of Breath    Having breathing issues  . Advicor [Niacin-Lovastatin Er] Other (See Comments)    Headache, possibly other reactions  . Lipitor [Atorvastatin] Other (See Comments)    Headache, possibly other reactions  . Pravachol [Pravastatin Sodium] Other (See Comments)    Headache, possibly other reactions  . Rosuvastatin Other (See Comments)     40 MG  TABLET--EXTREME FATIGUE  , MUSCLE  PAIN     Past Medical History:  Diagnosis Date  . Arthritis    intermittent  . CAD in native artery 1994, 2001   a) 2001 Exertional Angina --> Myoview: Inf infarct and Ant ischemia --> Cath: 95% pLAD, 99% D1, 80% RI, but Cx-OM1&2 OK , & patent RCA stent --> CABG: b) Cath in 2003 patent grafts, RCA & Cx; c) 08/2009 Myoview: Mod Basal-Mid inferoseptal, Basal-apical inferior Infarct. No ischemia.; d) May 2016: PCI to Ostial Cx (not bypassed) - Xience DES 3.0 x 15 (3.6 mm)  . Childhood asthma   . Hemorrhoids    history of  . History of ST elevation myocardial infarction (STEMI) of inferior wall 1994; 1997   PCI of RCA - redo PCI BMS ML Vision 3.0 mm x 25 mm to RCA in 1997  . Hyperlipidemia LDL goal <70    At goal by Most recent labs October 2013: TC 123, TG 83, HDL 81, LDL 65  . Hypertension   . Ischemic  cardiomyopathy 1994, 2003   Echo February 2014: EF 40-45%; moderate HK of anterolateral myocardium.; Grade 1 diastolic dysfunction  . Kidney stones    3 times had lithrotripsy  . Obesity (BMI 30.0-34.9)   . Pneumonia 2014  . S/P CABG x 3 2001   LIMA-LAD, SVG-diagonal, SVG-Ramus Intermedius (RI) - patent by Cath in 2003    Past Surgical History:  Procedure Laterality Date  . CARDIAC CATHETERIZATION  06/06/1999   Recommend CABG  . CARDIAC CATHETERIZATION  10/04/2001   Patent grafts with a 50% lesion in LAD beyond IMA insertion  . CARDIAC CATHETERIZATION N/A 07/31/2014   Procedure: Left Heart Cath and Cors/Grafts Angiography;  Surgeon: Jettie Booze, MD;  Location: [facility];  Service: Cardiovascular;  Laterality: N/A;  . CARDIAC CATHETERIZATION N/A 07/31/2014   Procedure: Coronary Stent Intervention;  Surgeon: Jettie Booze, MD;  Location: [facility];  Service: Cardiovascular: PCI to Ostial Cx (not bypassed) - Xience DES 3.0 x 15 (3.6 mm)  . CAROTID DOPPLER  05/06/2012   Less than 50% diameter reduction of the Lft Subclavian.  . CORONARY ANGIOPLASTY WITH STENT PLACEMENT  01/21/1996   PTCA of RCA-95% lesion/per wife 2015  . CORONARY ARTERY BYPASS GRAFT  06/07/1999   x3. LIMA to distal LAD, SVG to first diag, and SVG to ramus  . HYDROCELE EXCISION Left 02/28/2016   Procedure: HYDROCELECTOMY ADULT;  Surgeon: Kathie Rhodes, MD;  Location: [facility];  Service: Urology;  Laterality: Left;  . LITHOTRIPSY  "2-3 times"   1988/2003/2007  . NM MYOVIEW LTD  08/19/2009   Moderafte perfusion seen in Basal inferoseptal, Basal inferior, Mid inferoseptal, Mid inferiorm, and Apical inferior regions. No ECG changes. EKG negative for ischemia.  . TRANSTHORACIC ECHOCARDIOGRAM  05/06/2012   EF 40-45%, LV cavity moderately dilated, systolic function mild-moderately reduced, moderate hypokinesis of anterolatewral myocardium.    Family History  Problem Relation Age of Onset  . Heart attack Mother   . Heart disease Mother   . Stroke Sister        during cancer Rx  . Lymphoma Sister   . Lung cancer Sister     Social History   Socioeconomic History  . Marital status: Married    Spouse name: Not on file  . Number of children: 2  . Years of education: Not on file  . Highest education level: Not on file  Occupational History  . 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 Smoking status: Current Every Day Smoker    Packs/day: 0.50    Years: 48.00    Pack years: 24.00    Types: Cigarettes  . Smokeless tobacco: Never Used  Substance and Sexual Activity  . Alcohol use: No  . Drug use: No  . Sexual activity: Never    Other Topics Concern  . Not on file  Social History Narrative   Married father of 2 with one grandchild. By his wife.   Continues to smoke one half pack a day.   Very physically active at work but no routine exercise.      No living will    Wife would make decisions for him if needed. Sons would be alternates   Would accept resuscitation   Doesn't want prolonged machines or intervention   Social Determinants of Health   Financial Resource Strain:   . Difficulty of Paying Living Expenses:   Food Insecurity:   . Worried About Estate manager/land agent  of Food in the Last Year:   . [place] in the Last Year:   Transportation Needs:   . Lack of Transportation (Medical):   Marland Kitchen Lack of Transportation (Non-Medical):   Physical Activity:   . Days of Exercise per Week:   . Minutes of Exercise per Session:   Stress:   . Feeling of Stress :   Social Connections:   . Frequency of Communication with Friends and Family:   . Frequency of Social Gatherings with Friends and Family:   . Attends Religious Services:   . Active Member of Clubs or Organizations:   . Attends Archivist Meetings:   Marland Kitchen Marital Status:   Intimate Partner Violence:   . Fear of Current or Ex-Partner:   . Emotionally Abused:   Marland Kitchen Physically Abused:   . Sexually Abused:    Review of Systems No fever Some nausea last night No cough or SOB    Objective:   Physical Exam  Neck: No thyromegaly present.  Cardiovascular: Normal rate, regular rhythm and normal heart sounds. Exam reveals no gallop.  No murmur heard. Respiratory: Effort normal. No respiratory distress. He has no wheezes. He has no rales.  Decreased breath sounds Slight rhonchi  GI: Soft. He exhibits no distension. There is no abdominal tenderness. There is no rebound and no guarding.  Genitourinary:    Genitourinary Comments: No scrotal tenderness   Musculoskeletal:     Comments: No CVA tenderness now (did have lower right flank pain)   Lymphadenopathy:    He has no cervical adenopathy.           Assessment & Plan:

## 2019-07-22 NOTE — Addendum Note (Signed)
Addended by: Pilar Grammes on: 07/22/2019 08:52 AM   Modules accepted: Orders

## 2019-07-22 NOTE — Assessment & Plan Note (Signed)
Similar to past kidney stone symptoms Has microscopic hematuria Obstructive symptoms seem better today Not consistent with UTI but will check  May have passed stone but can't exclude obstruction/hydronephrosis Will check non contrast CT today Urology/consider tamsulosin (sees [facility]) depending on results

## 2019-07-22 NOTE — Progress Notes (Signed)
amsu

## 2019-07-22 NOTE — Telephone Encounter (Signed)
[facility] - Client TELEPHONE ADVICE RECORD Access[facility]urse Patient Name: Benjamin Robertson Gender: Male DOB: [birth year] Age: 69 Y 37 D Return Phone Number: [phone] (Primary), [phone] (Secondary) Address[place] [address] Client [place] [facility] - Client Client Site [place] - Day Physician Benjamin Robertson - MD Contact Type Call Who Is Calling Patient / Member / Family / Caregiver Call Type Triage / Clinical Caller Name Benjamin Robertson Relationship To Patient Spouse Return Phone Number [phone] (Primary) Chief Complaint Back Pain - General Reason for Call Symptomatic / Request for [place] states husband is having sharp pains in the kidney area that is radiating from the back to the front. He is only producing a little urine at a time. Translation No Nurse Assessment Nurse: Benjamin Emery, RN, Benjamin Robertson Date/Time Benjamin Robertson Time): 07/21/2019 4:35:25 PM Confirm and document reason for call. If symptomatic, describe symptoms. ---Caller states that patient is having right flank pain, having trouble peeing, cold sheet. Feels like there is a knife sticking in his back. Has the patient had close contact with a person known or suspected to have the novel coronavirus illness OR traveled / lives in area with major community spread (including international travel) in the last 14 days from the onset of symptoms? * If Asymptomatic, screen for exposure and travel within the last 14 days. ---No Does the patient have any new or worsening symptoms? ---Yes Will a triage be completed? ---Yes Related visit to physician within the last 2 weeks? ---N/A Does the PT have any chronic conditions? (i.e. diabetes, asthma, this includes High risk factors for pregnancy, etc.) ---Yes List chronic conditions. ---blood pressure, asthma, Is this a behavioral health or substance abuse call?  ---No Guidelines Guideline Title Affirmed Question Affirmed Notes Nurse Date/Time (Eastern Time) Flank Pain [1] SEVERE pain (e.g., excruciating, scale 8-10) AND [2] present > 1 hour Benjamin Robertson 07/21/2019 4:37:49 PM Disp. Time (Eastern Time) Disposition Final UserPLEASE NOTE: All timestamps contained within this report are represented as [place] Standard Time. CONFIDENTIALTY NOTICE: This fax transmission is intended only for the addressee. It contains information that is legally privileged, confidential or otherwise protected from use or disclosure. If you are not the intended recipient, you are strictly prohibited from reviewing, disclosing, copying using or disseminating any of this information or taking any action in reliance on or regarding this information. If you have received this fax in error, please notify us immediately by telephone so that we can arrange for its return to [place]. Phone: [phone], Toll-Free: [phone], Fax: [phone] Page: 2 of 2 Call Id: [id number] 07/21/2019 4:41:22 PM Go to ED Now Yes Benjamin Emery, RN, Benjamin Robertson Disagree/Comply Disagree Caller Understands Yes PreDisposition Call Doctor Care Advice Given Per Guideline GO TO ED NOW: * You need to be seen in the Emergency Department. ANOTHER ADULT SHOULD DRIVE: * It is better and safer if another adult drives instead of you. Comments User: Benjamin Corolla, RN Date/Time Benjamin Robertson Time): 07/21/2019 4:43:56 PM Patient declined to go to the ED. Caller connected to the back line. Referrals GO TO FACILITY REFUSED Warm transfer to backline

## 2019-07-22 NOTE — Telephone Encounter (Signed)
Pt had apt with PCP this morning

## 2019-07-23 LAB — URINE CULTURE
MICRO NUMBER:: 10463885
Result:: NO GROWTH
SPECIMEN QUALITY:: ADEQUATE

## 2019-08-18 ENCOUNTER — Telehealth: Payer: Self-pay

## 2019-08-18 DIAGNOSIS — J441 Chronic obstructive pulmonary disease with (acute) exacerbation: Secondary | ICD-10-CM | POA: Diagnosis not present

## 2019-08-18 DIAGNOSIS — R05 Cough: Secondary | ICD-10-CM | POA: Diagnosis not present

## 2019-08-18 NOTE — Telephone Encounter (Signed)
It doesn't sound like we will be allowed to bring him in with those symptoms. It would be best to be seen in person--like at an Urgent Care---wouldn't suggest the one on [address] since there is no x-ray

## 2019-08-18 NOTE — Telephone Encounter (Signed)
Patient's wife notified of Dr.Letvak's recommendations and verbalized understanding. She states she will take him to UC today.

## 2019-08-18 NOTE — Telephone Encounter (Signed)
Patient's wife contacted the office and states that patient has had a cough for about 1 week. She states that the cough is semi-productive, and lasts all day, but is worse at night time, and he cannot rest. She states that it does seem to be getting worse, and she want's to have him come in and be evaluated, and possibly have a chest xray? I advised with sx, we could not bring him in due to covid precautions, but wife asked that I would send a note to Dr. Silvio Pate to see what we could do.  Please advise.

## 2019-08-18 NOTE — Telephone Encounter (Signed)
Noted  

## 2019-08-23 DIAGNOSIS — J441 Chronic obstructive pulmonary disease with (acute) exacerbation: Secondary | ICD-10-CM | POA: Diagnosis not present

## 2019-08-23 DIAGNOSIS — R030 Elevated blood-pressure reading, without diagnosis of hypertension: Secondary | ICD-10-CM | POA: Diagnosis not present

## 2019-10-06 ENCOUNTER — Encounter: Payer: Self-pay | Admitting: Internal Medicine

## 2019-10-06 ENCOUNTER — Ambulatory Visit (INDEPENDENT_AMBULATORY_CARE_PROVIDER_SITE_OTHER): Payer: Medicare Other | Admitting: Internal Medicine

## 2019-10-06 ENCOUNTER — Other Ambulatory Visit: Payer: Self-pay

## 2019-10-06 VITALS — BP 136/78 | HR 59 | Temp 98.0°F | Ht 68.0 in | Wt 195.0 lb

## 2019-10-06 DIAGNOSIS — E785 Hyperlipidemia, unspecified: Secondary | ICD-10-CM

## 2019-10-06 DIAGNOSIS — J449 Chronic obstructive pulmonary disease, unspecified: Secondary | ICD-10-CM | POA: Diagnosis not present

## 2019-10-06 DIAGNOSIS — F17210 Nicotine dependence, cigarettes, uncomplicated: Secondary | ICD-10-CM

## 2019-10-06 LAB — LIPID PANEL
Cholesterol: 206 mg/dL — ABNORMAL HIGH (ref 0–200)
HDL: 34.3 mg/dL — ABNORMAL LOW (ref >39.00)
NonHDL: 171.47
Total CHOL/HDL Ratio: 6
Triglycerides: 274 mg/dL — ABNORMAL HIGH (ref 0.0–149.0)
VLDL: 54.8 mg/dL — ABNORMAL HIGH (ref 0.0–40.0)

## 2019-10-06 LAB — LDL CHOLESTEROL, DIRECT: Direct LDL: 154 mg/dL

## 2019-10-06 MED ORDER — VARENICLINE TARTRATE 0.5 MG X 11 & 1 MG X 42 PO MISC: ORAL | 0 refills | Status: DC

## 2019-10-06 MED ORDER — ALBUTEROL SULFATE HFA 108 (90 BASE) MCG/ACT IN AERS: 2.0000 | INHALATION_SPRAY | Freq: Four times a day (QID) | RESPIRATORY_TRACT | 1 refills | Status: AC | PRN

## 2019-10-06 MED ORDER — VARENICLINE TARTRATE 1 MG PO TABS: 1.0000 mg | ORAL_TABLET | Freq: Two times a day (BID) | ORAL | 3 refills | Status: DC

## 2019-10-06 NOTE — Assessment & Plan Note (Signed)
Will try chantix for this See back soon to confirm he has stopped

## 2019-10-06 NOTE — Assessment & Plan Note (Signed)
Intolerant of statins Using OTC med---will recheck labs May want to consider repatha

## 2019-10-06 NOTE — Assessment & Plan Note (Signed)
Recent exacerbation Better now--but chronic bronchitis component now Continue ipratropium--may want to consider more meds or pulmonary Will refill albuterol

## 2019-10-06 NOTE — Progress Notes (Signed)
Subjective:    Patient ID: Benjamin Robertson, male    DOB: 01-28-1951, 69 y.o.   MRN: [id number]  HPI Here for follow up of COPD exacerbation This visit occurred during the SARS-CoV-2 public health emergency.  Safety protocols were in place, including screening questions prior to the visit, additional usage of staff PPE, and extensive cleaning of exam room while observing appropriate contact time as indicated for disinfecting solutions.   Had acute illness with cough, etc May have been related to pollen exposure He feels he is over this  Gets some relief from the ipratropium--but feels his SOB gets worse at night Still with AM thick clear sputum  Still smoking ---"I'm fixing to quit" Smokes 1/3 PPD  Has been taking cholestoff for a few months Wonders if that has helped some  Current Outpatient Medications on File Prior to Visit  Medication Sig Dispense Refill  . aspirin 325 MG tablet Take 325 mg by mouth daily.    . Magnesium Oxide 250 MG TABS Take by mouth.    . metoprolol tartrate (LOPRESSOR) 50 MG tablet TAKE ONE-HALF TABLET BY  MOUTH TWICE A DAY 90 tablet 1  . naproxen sodium (ALEVE) 220 MG tablet Take 220 mg by mouth.    . nitroGLYCERIN (NITROLINGUAL) 0.4 MG/SPRAY spray Place 1 spray under the tongue every 5 (five) minutes x 3 doses as needed for chest pain. 4.9 g 2  . Plant Sterols and Stanols (CHOLESTOFF PO) Take by mouth.    . ramipril (ALTACE) 10 MG capsule TAKE 1 CAPSULE BY MOUTH  DAILY 90 capsule 3  . Tiotropium Bromide Monohydrate (SPIRIVA RESPIMAT) 1.25 MCG/ACT AERS Take 1 puff by mouth daily. 12 g 3   No current facility-administered medications on file prior to visit.    Allergies  Allergen Reactions  . Brilinta [Ticagrelor] Shortness Of Breath    Having breathing issues  . Advicor [Niacin-Lovastatin Er] Other (See Comments)    Headache, possibly other reactions  . Lipitor [Atorvastatin] Other (See Comments)    Headache, possibly other reactions  . Pravachol  [Pravastatin Sodium] Other (See Comments)    Headache, possibly other reactions  . Rosuvastatin Other (See Comments)     40 MG  TABLET--EXTREME FATIGUE  , MUSCLE  PAIN     Past Medical History:  Diagnosis Date  . Arthritis    intermittent  . CAD in native artery 1994, 2001   a) 2001 Exertional Angina --> Myoview: Inf infarct and Ant ischemia --> Cath: 95% pLAD, 99% D1, 80% RI, but Cx-OM1&2 OK , & patent RCA stent --> CABG: b) Cath in 2003 patent grafts, RCA & Cx; c) 08/2009 Myoview: Mod Basal-Mid inferoseptal, Basal-apical inferior Infarct. No ischemia.; d) May 2016: PCI to Ostial Cx (not bypassed) - Xience DES 3.0 x 15 (3.6 mm)  . Childhood asthma   . Hemorrhoids    history of  . History of ST elevation myocardial infarction (STEMI) of inferior wall 1994; 1997   PCI of RCA - redo PCI BMS ML Vision 3.0 mm x 25 mm to RCA in 1997  . Hyperlipidemia LDL goal <70    At goal by Most recent labs October 2013: TC 123, TG 83, HDL 81, LDL 65  . Hypertension   . Ischemic cardiomyopathy 1994, 2003   Echo February 2014: EF 40-45%; moderate HK of anterolateral myocardium.; Grade 1 diastolic dysfunction  . Kidney stones    3 times had lithrotripsy  . Obesity (BMI 30.0-34.9)   . Pneumonia 2014  .  S/P CABG x 3 2001   LIMA-LAD, SVG-diagonal, SVG-Ramus Intermedius (RI) - patent by Cath in 2003    Past Surgical History:  Procedure Laterality Date  . CARDIAC CATHETERIZATION  06/06/1999   Recommend CABG  . CARDIAC CATHETERIZATION  10/04/2001   Patent grafts with a 50% lesion in LAD beyond IMA insertion  . CARDIAC CATHETERIZATION N/A 07/31/2014   Procedure: Left Heart Cath and Cors/Grafts Angiography;  Surgeon: Jettie Booze, MD;  Location: [facility];  Service: Cardiovascular;  Laterality: N/A;  . CARDIAC CATHETERIZATION N/A 07/31/2014   Procedure: Coronary Stent Intervention;  Surgeon: Jettie Booze, MD;  Location: [facility];  Service: Cardiovascular: PCI to Ostial Cx (not  bypassed) - Xience DES 3.0 x 15 (3.6 mm)  . CAROTID DOPPLER  05/06/2012   Less than 50% diameter reduction of the Lft Subclavian.  . CORONARY ANGIOPLASTY WITH STENT PLACEMENT  01/21/1996   PTCA of RCA-95% lesion/per wife 2015  . CORONARY ARTERY BYPASS GRAFT  06/07/1999   x3. LIMA to distal LAD, SVG to first diag, and SVG to ramus  . HYDROCELE EXCISION Left 02/28/2016   Procedure: HYDROCELECTOMY ADULT;  Surgeon: Kathie Rhodes, MD;  Location: [facility];  Service: Urology;  Laterality: Left;  . LITHOTRIPSY  "2-3 times"   1988/2003/2007  . NM MYOVIEW LTD  08/19/2009   Moderafte perfusion seen in Basal inferoseptal, Basal inferior, Mid inferoseptal, Mid inferiorm, and Apical inferior regions. No ECG changes. EKG negative for ischemia.  . TRANSTHORACIC ECHOCARDIOGRAM  05/06/2012   EF 40-45%, LV cavity moderately dilated, systolic function mild-moderately reduced, moderate hypokinesis of anterolatewral myocardium.    Family History  Problem Relation Age of Onset  . Heart attack Mother   . Heart disease Mother   . Stroke Sister        during cancer Rx  . Lymphoma Sister   . Lung cancer Sister     Social History   Socioeconomic History  . Marital status: Married    Spouse name: Not on file  . Number of children: 2  . Years of education: Not on file  . Highest education level: Not on file  Occupational History  . 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 Smoking status: Current Every Day Smoker    Packs/day: 0.50    Years: 48.00    Pack years: 24.00    Types: Cigarettes  . Smokeless tobacco: Never Used  Substance and Sexual Activity  . Alcohol use: No  . Drug use: No  . Sexual activity: Never  Other Topics Concern  . Not on file  Social History Narrative   Married father of 2 with one grandchild. By his wife.   Continues to smoke one half pack a day.   Very physically active at work but no routine exercise.      No living will    Wife would  make decisions for him if needed. Sons would be alternates   Would accept resuscitation   Doesn't want prolonged machines or intervention   Social Determinants of Health   Financial Resource Strain:   . Difficulty of Paying Living Expenses:   Food Insecurity:   . Worried About Charity fundraiser in the Last Year:   . Arboriculturist in the Last Year:   Transportation Needs:   . Film/video editor (Medical):   Marland Kitchen Lack of Transportation (Non-Medical):   Physical Activity:   . Days  of Exercise per Week:   . Minutes of Exercise per Session:   Stress:   . Feeling of Stress :   Social Connections:   . Frequency of Communication with Friends and Family:   . Frequency of Social Gatherings with Friends and Family:   . Attends Religious Services:   . Active Member of Clubs or Organizations:   . Attends Archivist Meetings:   Marland Kitchen Marital Status:   Intimate Partner Violence:   . Fear of Current or Ex-Partner:   . Emotionally Abused:   Marland Kitchen Physically Abused:   . Sexually Abused:    Review of Systems  Sleeps okay Appetite is fine Has lost some weight     Objective:   Physical Exam Constitutional:      Appearance: Normal appearance.  Cardiovascular:     Rate and Rhythm: Normal rate and regular rhythm.     Heart sounds: No murmur heard.  No gallop.   Pulmonary:     Effort: Pulmonary effort is normal.     Breath sounds: No wheezing or rales.     Comments: Decreased breath sounds but clear Neurological:     Mental Status: He is alert.            Assessment & Plan:

## 2019-10-21 ENCOUNTER — Telehealth: Payer: Self-pay

## 2019-10-21 MED ORDER — REPATHA SURECLICK 140 MG/ML ~~LOC~~ SOAJ: 140.0000 mg | SUBCUTANEOUS | 11 refills | Status: DC

## 2019-10-21 NOTE — Telephone Encounter (Signed)
lmomed the pt to start the repatha, rx sent, instructed the pt to call back if unaffordable

## 2019-10-27 ENCOUNTER — Other Ambulatory Visit: Payer: Self-pay | Admitting: Cardiology

## 2019-11-14 ENCOUNTER — Inpatient Hospital Stay (HOSPITAL_COMMUNITY)
Admission: EM | Admit: 2019-11-14 | Discharge: 2019-11-19 | DRG: 247 | Disposition: A | Discharge status: Home / Self Care | Payer: Medicare Other | Attending: Cardiology | Admitting: Cardiology

## 2019-11-14 ENCOUNTER — Other Ambulatory Visit: Payer: Self-pay

## 2019-11-14 ENCOUNTER — Emergency Department (HOSPITAL_COMMUNITY): Payer: Medicare Other

## 2019-11-14 DIAGNOSIS — Z7982 Long term (current) use of aspirin: Secondary | ICD-10-CM | POA: Diagnosis not present

## 2019-11-14 DIAGNOSIS — F17210 Nicotine dependence, cigarettes, uncomplicated: Secondary | ICD-10-CM | POA: Diagnosis present

## 2019-11-14 DIAGNOSIS — Z87442 Personal history of urinary calculi: Secondary | ICD-10-CM

## 2019-11-14 DIAGNOSIS — Z955 Presence of coronary angioplasty implant and graft: Secondary | ICD-10-CM | POA: Diagnosis not present

## 2019-11-14 DIAGNOSIS — R42 Dizziness and giddiness: Secondary | ICD-10-CM | POA: Diagnosis not present

## 2019-11-14 DIAGNOSIS — I249 Acute ischemic heart disease, unspecified: Secondary | ICD-10-CM

## 2019-11-14 DIAGNOSIS — I2582 Chronic total occlusion of coronary artery: Secondary | ICD-10-CM | POA: Diagnosis not present

## 2019-11-14 DIAGNOSIS — Z951 Presence of aortocoronary bypass graft: Secondary | ICD-10-CM

## 2019-11-14 DIAGNOSIS — Z888 Allergy status to other drugs, medicaments and biological substances status: Secondary | ICD-10-CM | POA: Diagnosis not present

## 2019-11-14 DIAGNOSIS — I10 Essential (primary) hypertension: Secondary | ICD-10-CM | POA: Diagnosis present

## 2019-11-14 DIAGNOSIS — Z720 Tobacco use: Secondary | ICD-10-CM | POA: Diagnosis not present

## 2019-11-14 DIAGNOSIS — I214 Non-ST elevation (NSTEMI) myocardial infarction: Secondary | ICD-10-CM

## 2019-11-14 DIAGNOSIS — Z79899 Other long term (current) drug therapy: Secondary | ICD-10-CM

## 2019-11-14 DIAGNOSIS — E785 Hyperlipidemia, unspecified: Secondary | ICD-10-CM | POA: Diagnosis not present

## 2019-11-14 DIAGNOSIS — Z807 Family history of other malignant neoplasms of lymphoid, hematopoietic and related tissues: Secondary | ICD-10-CM | POA: Diagnosis not present

## 2019-11-14 DIAGNOSIS — E669 Obesity, unspecified: Secondary | ICD-10-CM | POA: Diagnosis present

## 2019-11-14 DIAGNOSIS — Z8249 Family history of ischemic heart disease and other diseases of the circulatory system: Secondary | ICD-10-CM | POA: Diagnosis not present

## 2019-11-14 DIAGNOSIS — I200 Unstable angina: Secondary | ICD-10-CM | POA: Diagnosis not present

## 2019-11-14 DIAGNOSIS — Z823 Family history of stroke: Secondary | ICD-10-CM | POA: Diagnosis not present

## 2019-11-14 DIAGNOSIS — I255 Ischemic cardiomyopathy: Secondary | ICD-10-CM | POA: Diagnosis not present

## 2019-11-14 DIAGNOSIS — Z801 Family history of malignant neoplasm of trachea, bronchus and lung: Secondary | ICD-10-CM

## 2019-11-14 DIAGNOSIS — I25110 Atherosclerotic heart disease of native coronary artery with unstable angina pectoris: Principal | ICD-10-CM | POA: Diagnosis present

## 2019-11-14 DIAGNOSIS — I252 Old myocardial infarction: Secondary | ICD-10-CM | POA: Diagnosis not present

## 2019-11-14 DIAGNOSIS — M1990 Unspecified osteoarthritis, unspecified site: Secondary | ICD-10-CM | POA: Diagnosis not present

## 2019-11-14 DIAGNOSIS — R079 Chest pain, unspecified: Secondary | ICD-10-CM | POA: Diagnosis not present

## 2019-11-14 DIAGNOSIS — J449 Chronic obstructive pulmonary disease, unspecified: Secondary | ICD-10-CM | POA: Diagnosis present

## 2019-11-14 DIAGNOSIS — Z20822 Contact with and (suspected) exposure to covid-19: Secondary | ICD-10-CM | POA: Diagnosis present

## 2019-11-14 LAB — HEPATIC FUNCTION PANEL
ALT: 16 U/L (ref 0–44)
AST: 20 U/L (ref 15–41)
Albumin: 4 g/dL (ref 3.5–5.0)
Alkaline Phosphatase: 62 U/L (ref 38–126)
Bilirubin, Direct: 0.2 mg/dL (ref 0.0–0.2)
Indirect Bilirubin: 0.6 mg/dL (ref 0.3–0.9)
Total Bilirubin: 0.8 mg/dL (ref 0.3–1.2)
Total Protein: 6.9 g/dL (ref 6.5–8.1)

## 2019-11-14 LAB — CBC
HCT: 53.3 % — ABNORMAL HIGH (ref 39.0–52.0)
Hemoglobin: 17.9 g/dL — ABNORMAL HIGH (ref 13.0–17.0)
MCH: 31.6 pg (ref 26.0–34.0)
MCHC: 33.6 g/dL (ref 30.0–36.0)
MCV: 94.2 fL (ref 80.0–100.0)
Platelets: 253 10*3/uL (ref 150–400)
RBC: 5.66 MIL/uL (ref 4.22–5.81)
RDW: 13.4 % (ref 11.5–15.5)
WBC: 9.7 10*3/uL (ref 4.0–10.5)
nRBC: 0 % (ref 0.0–0.2)

## 2019-11-14 LAB — BASIC METABOLIC PANEL
Anion gap: 11 (ref 5–15)
BUN: 15 mg/dL (ref 8–23)
CO2: 26 mmol/L (ref 22–32)
Calcium: 9.8 mg/dL (ref 8.9–10.3)
Chloride: 102 mmol/L (ref 98–111)
Creatinine, Ser: 1.17 mg/dL (ref 0.61–1.24)
GFR calc Af Amer: >60 mL/min (ref >60)
GFR calc non Af Amer: >60 mL/min (ref >60)
Glucose, Bld: 148 mg/dL — ABNORMAL HIGH (ref 70–99)
Potassium: 4.5 mmol/L (ref 3.5–5.1)
Sodium: 139 mmol/L (ref 135–145)

## 2019-11-14 LAB — TROPONIN I (HIGH SENSITIVITY)
Troponin I (High Sensitivity): 74 ng/L — ABNORMAL HIGH (ref <18)
Troponin I (High Sensitivity): 81 ng/L — ABNORMAL HIGH (ref <18)

## 2019-11-14 LAB — LIPASE, BLOOD: Lipase: 44 U/L (ref 11–51)

## 2019-11-14 LAB — SARS CORONAVIRUS 2 BY RT PCR (HOSPITAL ORDER, PERFORMED IN ~~LOC~~ HOSPITAL LAB): SARS Coronavirus 2: NEGATIVE

## 2019-11-14 MED ORDER — NITROGLYCERIN IN D5W 200-5 MCG/ML-% IV SOLN
0.0000 ug/min | INTRAVENOUS | Status: DC
  Administered 2019-11-14: 5 ug/min via INTRAVENOUS
  Administered 2019-11-15: 45 ug/min via INTRAVENOUS
  Filled 2019-11-14 (×2): qty 250

## 2019-11-14 MED ORDER — MORPHINE SULFATE (PF) 4 MG/ML IV SOLN
4.0000 mg | INTRAVENOUS | Status: DC | PRN
  Administered 2019-11-16 – 2019-11-18 (×2): 4 mg via INTRAVENOUS
  Filled 2019-11-14 (×3): qty 1

## 2019-11-14 MED ORDER — METOCLOPRAMIDE HCL 5 MG/ML IJ SOLN
10.0000 mg | Freq: Once | INTRAMUSCULAR | Status: AC
  Administered 2019-11-14: 10 mg via INTRAVENOUS
  Filled 2019-11-14: qty 2

## 2019-11-14 MED ORDER — HEPARIN (PORCINE) 25000 UT/250ML-% IV SOLN
1300.0000 [IU]/h | INTRAVENOUS | Status: DC
  Administered 2019-11-14 – 2019-11-15 (×2): 1300 [IU]/h via INTRAVENOUS
  Filled 2019-11-14 (×3): qty 250

## 2019-11-14 MED ORDER — MORPHINE SULFATE (PF) 4 MG/ML IV SOLN
4.0000 mg | Freq: Once | INTRAVENOUS | Status: AC
  Administered 2019-11-14: 4 mg via INTRAVENOUS
  Filled 2019-11-14: qty 1

## 2019-11-14 MED ORDER — HEPARIN BOLUS VIA INFUSION
4000.0000 [IU] | Freq: Once | INTRAVENOUS | Status: AC
  Administered 2019-11-14: 4000 [IU] via INTRAVENOUS
  Filled 2019-11-14: qty 4000

## 2019-11-14 NOTE — ED Provider Notes (Signed)
Nursing brought me the patient's EKG which showed some concern for ischemia.  When compared to prior, it looks somewhat similar however, I went to evaluate the patient myself in triage.  Patient describes he is having pressure-like chest pain going down his left arm and into his jaw that feels similar to prior MI.  He reports exertional shortness of breath, lightheadedness, nausea, and diaphoresis.  The pain did not radiate through to his back and was not sharp in description  Patient's breath sounds were clear and symmetric on my exam he did have a slight murmur.  Patient was not hypotensive or tachycardic on my exam.  I called cardiology and they did not feel the patient qualified as a STEMI at this time.  They did request he get a bed in the emergency department, get labs, and have a nitro and heparin drip initiated.  They request the consult cardiology team to be called after work-up has been completed.  Anticipate admission by cardiology after work-up is further along.    Shanine Kreiger, Gwenyth Allegra, MD 11/14/19 [phone]

## 2019-11-14 NOTE — ED Provider Notes (Signed)
[facility] EMERGENCY DEPARTMENT Provider Note   CSN: [id number] Arrival date & time: 11/14/19  1859     History Chief Complaint  Patient presents with   Chest Pain    Benjamin Robertson is a 69 y.o. male with prior history of significant coronary artery disease status post CABG and multiple stents, presented to emergency department with chest pain and neck pain that began last night.  Symptoms began at rest last night.  He says they are extremely reminiscent of his prior heart attacks.  He took 1 nitroglycerin tablet last night and multiple more this morning, with minimal relief of his pain.  He continues having severe pain in his chest that radiates towards his back.  He reports nausea.  Reports shortness of breath and lightheadedness.  Chest pain is centrally located and radiates to the neck.  It is 10 out of 10 intensity.  It is similar to prior pain is had with his cardiac episodes.  It began gradually last night.  Nothing has made it better or worse.  Allergies to lipitor.  HPI     Past Medical History:  Diagnosis Date   Arthritis    intermittent   CAD in native artery 1994, 2001   a) 2001 Exertional Angina --> Myoview: Inf infarct and Ant ischemia --> Cath: 95% pLAD, 99% D1, 80% RI, but Cx-OM1&2 OK , & patent RCA stent --> CABG: b) Cath in 2003 patent grafts, RCA & Cx; c) 08/2009 Myoview: Mod Basal-Mid inferoseptal, Basal-apical inferior Infarct. No ischemia.; d) May 2016: PCI to Ostial Cx (not bypassed) - Xience DES 3.0 x 15 (3.6 mm)   Childhood asthma    Hemorrhoids    history of   History of ST elevation myocardial infarction (STEMI) of inferior wall 1994; 1997   PCI of RCA - redo PCI BMS ML Vision 3.0 mm x 25 mm to RCA in 1997   Hyperlipidemia LDL goal <70    At goal by Most recent labs October 2013: TC 123, TG 83, HDL 81, LDL 65   Hypertension    Ischemic cardiomyopathy 1994, 2003   Echo February 2014: EF 40-45%; moderate HK of anterolateral  myocardium.; Grade 1 diastolic dysfunction   Kidney stones    3 times had lithrotripsy   Obesity (BMI 30.0-34.9)    Pneumonia 2014   S/P CABG x 3 2001   LIMA-LAD, SVG-diagonal, SVG-Ramus Intermedius (RI) - patent by Cath in 2003    Patient Active Problem List   Diagnosis Date Noted   Right flank pain 07/22/2019   Microscopic hematuria 07/22/2019   Atherosclerotic heart disease of native coronary artery with angina pectoris ([place]) 03/03/2019   Preventative health care 12/05/2017   History of kidney stones 12/05/2017   Advance directive discussed with patient 12/05/2017   COPD (chronic obstructive pulmonary disease) ([place]) 05/30/2017   CAD S/P PCI - DES PCI to prox Cx XIENCE ALPINE RX 3.0X15  07/31/2014   Presence of drug coated stent in left circumflex coronary artery 07/31/2014    Class: Status post   Unstable angina ([place]) 07/30/2014   Essential hypertension 12/13/2013   Left anterior fascicular block    Ischemic cardiomyopathy    Hyperlipidemia with target LDL less than 70    Cigarette smoker one half pack a day or less    Obesity (BMI 30.0-34.9)    CAD in native artery - status post CABG x3 after PCI x2 to the RCA (LIMA-LAD, SVG-diagonal SVG-RI 03/16/1992    Past  Surgical History:  Procedure Laterality Date   CARDIAC CATHETERIZATION  06/06/1999   Recommend CABG   CARDIAC CATHETERIZATION  10/04/2001   Patent grafts with a 50% lesion in LAD beyond IMA insertion   CARDIAC CATHETERIZATION N/A 07/31/2014   Procedure: Left Heart Cath and Cors/Grafts Angiography;  Surgeon: Jettie Booze, MD;  Location: [facility];  Service: Cardiovascular;  Laterality: N/A;   CARDIAC CATHETERIZATION N/A 07/31/2014   Procedure: Coronary Stent Intervention;  Surgeon: Jettie Booze, MD;  Location: [facility];  Service: Cardiovascular: PCI to Ostial Cx (not bypassed) - Xience DES 3.0 x 15 (3.6 mm)   CAROTID DOPPLER  05/06/2012   Less than 50% diameter  reduction of the Lft Subclavian.   CORONARY ANGIOPLASTY WITH STENT PLACEMENT  01/21/1996   PTCA of RCA-95% lesion/per wife 2015   CORONARY ARTERY BYPASS GRAFT  06/07/1999   x3. LIMA to distal LAD, SVG to first diag, and SVG to ramus   HYDROCELE EXCISION Left 02/28/2016   Procedure: HYDROCELECTOMY ADULT;  Surgeon: Kathie Rhodes, MD;  Location: [facility];  Service: Urology;  Laterality: Left;   LITHOTRIPSY  "2-3 times"   1988/2003/2007   NM MYOVIEW LTD  08/19/2009   Moderafte perfusion seen in Basal inferoseptal, Basal inferior, Mid inferoseptal, Mid inferiorm, and Apical inferior regions. No ECG changes. EKG negative for ischemia.   TRANSTHORACIC ECHOCARDIOGRAM  05/06/2012   EF 40-45%, LV cavity moderately dilated, systolic function mild-moderately reduced, moderate hypokinesis of anterolatewral myocardium.       Family History  Problem Relation Age of Onset   Heart attack Mother    Heart disease Mother    Stroke Sister        during cancer Rx   Lymphoma Sister    Lung cancer Sister     Social History   Tobacco Use   Smoking status: Current Every Day Smoker    Packs/day: 0.50    Years: 48.00    Pack years: 24.00    Types: Cigarettes   Smokeless tobacco: Never Used  Substance Use Topics   Alcohol use: No   Drug use: No    Home Medications Prior to Admission medications   Medication Sig Start Date End Date Taking? Authorizing Provider  albuterol (VENTOLIN HFA) 108 (90 Base) MCG/ACT inhaler Inhale 2 puffs into the lungs every 6 (six) hours as needed for wheezing or shortness of breath. 10/06/19  Yes Venia Carbon, MD  aspirin 325 MG tablet Take 325 mg by mouth daily.   Yes [provider]  Evolocumab (REPATHA SURECLICK) 409 MG/ML SOAJ Inject 140 mg into the skin every 14 (fourteen) days. 10/21/19  Yes Leonie Man, MD  Magnesium Oxide 250 MG TABS Take 1 tablet by mouth daily.    Yes [provider]  metoprolol tartrate (LOPRESSOR) 50 MG  tablet TAKE ONE-HALF TABLET BY  MOUTH TWICE DAILY 10/27/19  Yes Leonie Man, MD  naproxen sodium (ALEVE) 220 MG tablet Take 220 mg by mouth daily as needed (pain).    Yes [provider]  nitroGLYCERIN (NITROLINGUAL) 0.4 MG/SPRAY spray Place 1 spray under the tongue every 5 (five) minutes x 3 doses as needed for chest pain. 03/03/19  Yes Leonie Man, MD  ramipril (ALTACE) 10 MG capsule TAKE 1 CAPSULE BY MOUTH  DAILY 12/17/18  Yes Leonie Man, MD  Tiotropium Bromide Monohydrate (SPIRIVA RESPIMAT) 1.25 MCG/ACT AERS Take 1 puff by mouth daily. 09/09/18  Yes Venia Carbon, MD  varenicline (CHANTIX PAK) 0.5 MG X 11 & 1 MG X 42 tablet Take one 0.5 mg tablet by mouth once daily for 3 days, then increase to one 0.5 mg tablet twice daily for 4 days, then increase to one 1 mg tablet twice daily. Patient not taking: Reported on 11/14/2019 10/06/19   Venia Carbon, MD  varenicline (CHANTIX) 1 MG tablet Take 1 tablet (1 mg total) by mouth 2 (two) times daily. Patient not taking: Reported on 11/14/2019 10/06/19   Venia Carbon, MD    Allergies    Brilinta [ticagrelor], Advicor [niacin-lovastatin er], Lipitor [atorvastatin], Pravachol [pravastatin sodium], and Rosuvastatin  Review of Systems   Review of Systems  Constitutional: Negative for chills and fever.  Eyes: Negative for pain and visual disturbance.  Respiratory: Positive for shortness of breath. Negative for cough.   Cardiovascular: Positive for chest pain. Negative for palpitations.  Gastrointestinal: Positive for abdominal pain and nausea. Negative for vomiting.  Genitourinary: Negative for dysuria and hematuria.  Musculoskeletal: Negative for arthralgias and back pain.  Skin: Negative for pallor and rash.  Neurological: Positive for headaches. Negative for syncope.  Psychiatric/Behavioral: Negative for agitation and confusion.  All other systems reviewed and are negative.   Physical Exam Updated Vital Signs BP  137/74    Pulse 82    Temp 98.8 F (37.1 C) (Oral)    Resp 17    Ht 5\' 8"  (1.727 m)    Wt 88.5 kg    SpO2 92%    BMI 29.65 kg/m   Physical Exam Vitals and nursing note reviewed.  Constitutional:      Appearance: He is well-developed. He is ill-appearing.  HENT:     Head: Normocephalic and atraumatic.  Eyes:     Conjunctiva/sclera: Conjunctivae normal.  Cardiovascular:     Rate and Rhythm: Normal rate and regular rhythm.     Heart sounds: Normal heart sounds.  Pulmonary:     Effort: Pulmonary effort is normal. No respiratory distress.     Breath sounds: Normal breath sounds.  Abdominal:     Palpations: Abdomen is soft.     Tenderness: There is no abdominal tenderness.  Musculoskeletal:     Cervical back: Neck supple.  Skin:    General: Skin is warm and dry.  Neurological:     General: No focal deficit present.     Mental Status: He is alert and oriented to person, place, and time.     ED Results / Procedures / Treatments   Labs (all labs ordered are listed, but only abnormal results are displayed) Labs Reviewed  BASIC METABOLIC PANEL - Abnormal; Notable for the following components:      Result Value   Glucose, Bld 148 (*)    All other components within normal limits  CBC - Abnormal; Notable for the following components:   Hemoglobin 17.9 (*)    HCT 53.3 (*)    All other components within normal limits  TROPONIN I (HIGH SENSITIVITY) - Abnormal; Notable for the following components:   Troponin I (High Sensitivity) 74 (*)    All other components within normal limits  TROPONIN I (HIGH SENSITIVITY) - Abnormal; Notable for the following components:   Troponin I (High Sensitivity) 81 (*)    All other components within normal limits  TROPONIN I (HIGH SENSITIVITY) - Abnormal; Notable for the following components:   Troponin I (High Sensitivity) 118 (*)    All other components within normal limits  SARS CORONAVIRUS 2 BY RT  PCR ([facility])  HEPATIC FUNCTION PANEL  LIPASE, BLOOD  HEPARIN LEVEL (UNFRACTIONATED)  HIV ANTIBODY (ROUTINE TESTING W REFLEX)  BASIC METABOLIC PANEL  CBC  LIPID PANEL  PROTIME-INR  TROPONIN I (HIGH SENSITIVITY)    EKG EKG Interpretation  Date/Time:  Friday November 14 2019 20:59:04 EDT Ventricular Rate:  73 PR Interval:  180 QRS Duration: 95 QT Interval:  370 QTC Calculation: 408 R Axis:   103 Text Interpretation: Sinus rhythm Right axis deviation Low voltage, precordial leads Abnormal T, consider ischemia, lateral leads Isolated ST elevation in III, ST depressions in lead I and aVL Confirmed by Octaviano Glow [phone]) on 11/14/2019 9:18:03 PM   Radiology DG Chest 2 View  Result Date: 11/14/2019 CLINICAL DATA:  Chest pain EXAM: CHEST - 2 VIEW COMPARISON:  01/29/2015 FINDINGS: Lungs are clear. No pneumothorax or pleural effusion. Coronary artery bypass grafting has been performed. Cardiac size within normal limits. The pulmonary vascularity is normal. Degenerative changes are seen within the thoracic spine. IMPRESSION: No acute process in the chest. Electronically Signed   By: Fidela Salisbury MD   On: 11/14/2019 19:33    Procedures .Critical Care Performed by: Wyvonnia Dusky, MD Authorized by: Wyvonnia Dusky, MD   Critical care provider statement:    Critical care time (minutes):  45   Critical care was necessary to treat or prevent imminent or life-threatening deterioration of the following conditions:  Circulatory failure   Critical care was time spent personally by me on the following activities:  Discussions with consultants, evaluation of patient's response to treatment, examination of patient, ordering and performing treatments and interventions, ordering and review of laboratory studies, ordering and review of radiographic studies, pulse oximetry, re-evaluation of patient's condition, obtaining history from patient or surrogate and review of old charts Comments:      IV heparin for unstable angina   (including critical care time)  Medications Ordered in ED Medications  nitroGLYCERIN 50 mg in dextrose 5 % 250 mL (0.2 mg/mL) infusion (40 mcg/min Intravenous Rate/Dose Change 11/15/19 0002)  heparin ADULT infusion 100 units/mL (25000 units/246mL sodium chloride 0.45%) (1,300 Units/hr Intravenous New Bag/Given 11/14/19 2039)  morphine 4 MG/ML injection 4 mg (has no administration in time range)  aspirin EC tablet 81 mg (has no administration in time range)  nitroGLYCERIN (NITROSTAT) SL tablet 0.4 mg (has no administration in time range)  acetaminophen (TYLENOL) tablet 650 mg (has no administration in time range)  ondansetron (ZOFRAN) injection 4 mg (has no administration in time range)  metoprolol tartrate (LOPRESSOR) tablet 25 mg (has no administration in time range)  ramipril (ALTACE) capsule 10 mg (has no administration in time range)  heparin bolus via infusion 4,000 Units (4,000 Units Intravenous Bolus from Bag 11/14/19 2040)  morphine 4 MG/ML injection 4 mg (4 mg Intravenous Given 11/14/19 2035)  metoCLOPramide (REGLAN) injection 10 mg (10 mg Intravenous Given 11/14/19 2307)    ED Course  I have reviewed the triage vital signs and the nursing notes.  Pertinent labs & imaging results that were available during my care of the patient were reviewed by me and considered in my medical decision making (see chart for details).  69 year old male present emergency department with acute chest pain with onset 12 + hours ago last night, without relief with sublingual nitro at home.  He took full dose aspirin prior to arriving.  He reports that the symptoms are exactly identical to his prior heart attacks.  On  arrival he is mildly hypertensive otherwise has stable vital signs.  He is not hypoxic.  He appears to be in a moderate amount of distress.  EKG on arrival per my review shows minimal ST elevation in lead III, with no reciprocal depressions.  This does not meet  STEMI criteria.  Patient was initially evaluated and screened by Dr. Glynda Jaeger EDP who reached out to cardiology by phone, who felt this ECG was not consistent with STEMI, and had recommended medical workup including labs and pain mgmt, and then a formal cardiology consult in the ED.  Labs are ordered.  This includes a chest x-ray and Covid screen.  We will give the patient 4 mg of morphine for pain.  Given his description that the pain is identical to his prior MI, I have a lower suspicion for aortic dissection or PE at this time.  Trop 70-80's here on repeat, flat.  3rd trop pending. BMP and CBC largely unremarkable.  Some elevation of hgb noted 17.9.   Covid negative  Xray chest per my review shows no focal consolidations or PTX   Clinical Course as of Nov 15 31  Fri Nov 14, 2019  2031 He took aspirin 325 thoday before coming in.   [MT]  2117 Repeat ECG with no evolution of ischemic changes, no STEMI   [MT]  2146 2nd trop flat, page out to cardiology for evaluation   [MT]  2240 Cards fellow to admit patient for unstable angina.   [MT]  2245 Reglan ordered for headache   [MT]    Clinical Course User Index [MT] Demitrus Francisco, Carola Rhine, MD    Final Clinical Impression(s) / ED Diagnoses Final diagnoses:  Unstable angina [facility])    Rx / DC Orders ED Discharge Orders    None       Darrill Vreeland, Carola Rhine, MD 11/15/19 [phone]

## 2019-11-14 NOTE — Progress Notes (Signed)
ANTICOAGULATION CONSULT NOTE - Initial Consult  Pharmacy Consult for IV heparin Indication: chest pain/ACS  Allergies  Allergen Reactions  . Brilinta [Ticagrelor] Shortness Of Breath    Having breathing issues  . Advicor [Niacin-Lovastatin Er] Other (See Comments)    Headache, possibly other reactions  . Lipitor [Atorvastatin] Other (See Comments)    Headache, possibly other reactions  . Pravachol [Pravastatin Sodium] Other (See Comments)    Headache, possibly other reactions  . Rosuvastatin Other (See Comments)     40 MG  TABLET--EXTREME FATIGUE  , MUSCLE  PAIN     Patient Measurements: Height: 5\' 8"  (172.7 cm) Weight: 88.5 kg (195 lb) IBW/kg (Calculated) : 68.4 Heparin Dosing Weight: 86.4kg  Vital Signs: Temp: 98.8 F (37.1 C) (09/03 1911) Temp Source: Oral (09/03 1911) BP: 184/81 (09/03 1911) Pulse Rate: 76 (09/03 1911)  Labs: Recent Labs    11/14/19 1913  HGB 17.9*  HCT 53.3*  PLT 253    CrCl cannot be calculated (Patient's most recent lab result is older than the maximum 21 days allowed.).   Medical History: Past Medical History:  Diagnosis Date  . Arthritis    intermittent  . CAD in native artery 1994, 2001   a) 2001 Exertional Angina --> Myoview: Inf infarct and Ant ischemia --> Cath: 95% pLAD, 99% D1, 80% RI, but Cx-OM1&2 OK , & patent RCA stent --> CABG: b) Cath in 2003 patent grafts, RCA & Cx; c) 08/2009 Myoview: Mod Basal-Mid inferoseptal, Basal-apical inferior Infarct. No ischemia.; d) May 2016: PCI to Ostial Cx (not bypassed) - Xience DES 3.0 x 15 (3.6 mm)  . Childhood asthma   . Hemorrhoids    history of  . History of ST elevation myocardial infarction (STEMI) of inferior wall 1994; 1997   PCI of RCA - redo PCI BMS ML Vision 3.0 mm x 25 mm to RCA in 1997  . Hyperlipidemia LDL goal <70    At goal by Most recent labs October 2013: TC 123, TG 83, HDL 81, LDL 65  . Hypertension   . Ischemic cardiomyopathy 1994, 2003   Echo February 2014: EF 40-45%;  moderate HK of anterolateral myocardium.; Grade 1 diastolic dysfunction  . Kidney stones    3 times had lithrotripsy  . Obesity (BMI 30.0-34.9)   . Pneumonia 2014  . S/P CABG x 3 2001   LIMA-LAD, SVG-diagonal, SVG-Ramus Intermedius (RI) - patent by Cath in 2003    Assessment: 69yoM with chest pressure/pain radiating to left arm and jaw. EKG concerning for ischemia but not full STEMI. Not on AC PTA. CBC WNL.   Goal of Therapy:  Heparin level 0.3-0.7 units/ml Monitor platelets by anticoagulation protocol: Yes   Plan:  Give 4,000 units bolus x 1 Start heparin infusion at 1,300 units/hr Check anti-Xa level in 6 hours and daily while on heparin Continue to monitor H&H and platelets  Mercy Riding, PharmD PGY1 Acute Care Pharmacy Resident Please refer to [facility] for unit-specific pharmacist

## 2019-11-14 NOTE — ED Triage Notes (Signed)
Pt here reports chest pain since last night. Took one nitro last night and four since 1200 today. Reports pain radiating up into neck. Hx MI x 4 and open heart surgery.

## 2019-11-15 ENCOUNTER — Encounter (HOSPITAL_COMMUNITY): Payer: Self-pay | Admitting: Cardiovascular Disease

## 2019-11-15 ENCOUNTER — Inpatient Hospital Stay (HOSPITAL_COMMUNITY): Payer: Medicare Other

## 2019-11-15 DIAGNOSIS — Z20822 Contact with and (suspected) exposure to covid-19: Secondary | ICD-10-CM | POA: Diagnosis present

## 2019-11-15 DIAGNOSIS — I255 Ischemic cardiomyopathy: Secondary | ICD-10-CM | POA: Diagnosis present

## 2019-11-15 DIAGNOSIS — F17210 Nicotine dependence, cigarettes, uncomplicated: Secondary | ICD-10-CM | POA: Diagnosis present

## 2019-11-15 DIAGNOSIS — I214 Non-ST elevation (NSTEMI) myocardial infarction: Secondary | ICD-10-CM

## 2019-11-15 DIAGNOSIS — Z955 Presence of coronary angioplasty implant and graft: Secondary | ICD-10-CM | POA: Diagnosis not present

## 2019-11-15 DIAGNOSIS — E669 Obesity, unspecified: Secondary | ICD-10-CM | POA: Diagnosis present

## 2019-11-15 DIAGNOSIS — I10 Essential (primary) hypertension: Secondary | ICD-10-CM | POA: Diagnosis not present

## 2019-11-15 DIAGNOSIS — Z951 Presence of aortocoronary bypass graft: Secondary | ICD-10-CM | POA: Diagnosis not present

## 2019-11-15 DIAGNOSIS — I2582 Chronic total occlusion of coronary artery: Secondary | ICD-10-CM | POA: Diagnosis not present

## 2019-11-15 DIAGNOSIS — Z720 Tobacco use: Secondary | ICD-10-CM | POA: Diagnosis not present

## 2019-11-15 DIAGNOSIS — Z807 Family history of other malignant neoplasms of lymphoid, hematopoietic and related tissues: Secondary | ICD-10-CM | POA: Diagnosis not present

## 2019-11-15 DIAGNOSIS — Z888 Allergy status to other drugs, medicaments and biological substances status: Secondary | ICD-10-CM | POA: Diagnosis not present

## 2019-11-15 DIAGNOSIS — I25110 Atherosclerotic heart disease of native coronary artery with unstable angina pectoris: Secondary | ICD-10-CM | POA: Diagnosis not present

## 2019-11-15 DIAGNOSIS — E785 Hyperlipidemia, unspecified: Secondary | ICD-10-CM | POA: Diagnosis not present

## 2019-11-15 DIAGNOSIS — Z79899 Other long term (current) drug therapy: Secondary | ICD-10-CM | POA: Diagnosis not present

## 2019-11-15 DIAGNOSIS — Z8249 Family history of ischemic heart disease and other diseases of the circulatory system: Secondary | ICD-10-CM | POA: Diagnosis not present

## 2019-11-15 DIAGNOSIS — I249 Acute ischemic heart disease, unspecified: Secondary | ICD-10-CM | POA: Diagnosis not present

## 2019-11-15 DIAGNOSIS — Z801 Family history of malignant neoplasm of trachea, bronchus and lung: Secondary | ICD-10-CM | POA: Diagnosis not present

## 2019-11-15 DIAGNOSIS — Z7982 Long term (current) use of aspirin: Secondary | ICD-10-CM | POA: Diagnosis not present

## 2019-11-15 DIAGNOSIS — I252 Old myocardial infarction: Secondary | ICD-10-CM | POA: Diagnosis not present

## 2019-11-15 DIAGNOSIS — M1990 Unspecified osteoarthritis, unspecified site: Secondary | ICD-10-CM | POA: Diagnosis present

## 2019-11-15 DIAGNOSIS — I200 Unstable angina: Secondary | ICD-10-CM

## 2019-11-15 DIAGNOSIS — R42 Dizziness and giddiness: Secondary | ICD-10-CM | POA: Diagnosis present

## 2019-11-15 DIAGNOSIS — Z87442 Personal history of urinary calculi: Secondary | ICD-10-CM | POA: Diagnosis not present

## 2019-11-15 DIAGNOSIS — Z823 Family history of stroke: Secondary | ICD-10-CM | POA: Diagnosis not present

## 2019-11-15 HISTORY — PX: TRANSTHORACIC ECHOCARDIOGRAM: SHX275

## 2019-11-15 LAB — LIPID PANEL
Cholesterol: 127 mg/dL (ref 0–200)
HDL: 41 mg/dL (ref >40)
LDL Cholesterol: 61 mg/dL (ref 0–99)
Total CHOL/HDL Ratio: 3.1 RATIO
Triglycerides: 123 mg/dL (ref <150)
VLDL: 25 mg/dL (ref 0–40)

## 2019-11-15 LAB — CBC
HCT: 48.1 % (ref 39.0–52.0)
Hemoglobin: 15.8 g/dL (ref 13.0–17.0)
MCH: 30.7 pg (ref 26.0–34.0)
MCHC: 32.8 g/dL (ref 30.0–36.0)
MCV: 93.4 fL (ref 80.0–100.0)
Platelets: 221 10*3/uL (ref 150–400)
RBC: 5.15 MIL/uL (ref 4.22–5.81)
RDW: 13.2 % (ref 11.5–15.5)
WBC: 8.8 10*3/uL (ref 4.0–10.5)
nRBC: 0 % (ref 0.0–0.2)

## 2019-11-15 LAB — BASIC METABOLIC PANEL
Anion gap: 9 (ref 5–15)
BUN: 15 mg/dL (ref 8–23)
CO2: 26 mmol/L (ref 22–32)
Calcium: 9.3 mg/dL (ref 8.9–10.3)
Chloride: 104 mmol/L (ref 98–111)
Creatinine, Ser: 0.96 mg/dL (ref 0.61–1.24)
GFR calc Af Amer: >60 mL/min (ref >60)
GFR calc non Af Amer: >60 mL/min (ref >60)
Glucose, Bld: 123 mg/dL — ABNORMAL HIGH (ref 70–99)
Potassium: 4.2 mmol/L (ref 3.5–5.1)
Sodium: 139 mmol/L (ref 135–145)

## 2019-11-15 LAB — ECHOCARDIOGRAM COMPLETE
Area-P 1/2: 3.39 cm2
Height: 68 in
S' Lateral: 3 cm
Weight: 3001.78 oz

## 2019-11-15 LAB — TROPONIN I (HIGH SENSITIVITY)
Troponin I (High Sensitivity): 118 ng/L (ref <18)
Troponin I (High Sensitivity): 177 ng/L (ref <18)
Troponin I (High Sensitivity): 465 ng/L (ref <18)

## 2019-11-15 LAB — HEPARIN LEVEL (UNFRACTIONATED)
Heparin Unfractionated: 0.46 IU/mL (ref 0.30–0.70)
Heparin Unfractionated: 0.4 IU/mL (ref 0.30–0.70)

## 2019-11-15 LAB — PROTIME-INR
INR: 1 (ref 0.8–1.2)
Prothrombin Time: 12.7 seconds (ref 11.4–15.2)

## 2019-11-15 LAB — HIV ANTIBODY (ROUTINE TESTING W REFLEX): HIV Screen 4th Generation wRfx: NONREACTIVE

## 2019-11-15 MED ORDER — NITROGLYCERIN 0.4 MG SL SUBL: 0.4000 mg | SUBLINGUAL_TABLET | SUBLINGUAL | Status: DC | PRN

## 2019-11-15 MED ORDER — RAMIPRIL 10 MG PO CAPS
10.0000 mg | ORAL_CAPSULE | Freq: Every day | ORAL | Status: DC
  Administered 2019-11-15 – 2019-11-19 (×3): 10 mg via ORAL
  Filled 2019-11-15 (×5): qty 1

## 2019-11-15 MED ORDER — METOPROLOL TARTRATE 25 MG PO TABS
25.0000 mg | ORAL_TABLET | Freq: Two times a day (BID) | ORAL | Status: DC
  Administered 2019-11-15 – 2019-11-19 (×8): 25 mg via ORAL
  Filled 2019-11-15 (×9): qty 1

## 2019-11-15 MED ORDER — ONDANSETRON HCL 4 MG/2ML IJ SOLN: 4.0000 mg | Freq: Four times a day (QID) | INTRAMUSCULAR | Status: DC | PRN

## 2019-11-15 MED ORDER — ASPIRIN EC 81 MG PO TBEC: 81.0000 mg | DELAYED_RELEASE_TABLET | Freq: Every day | ORAL | Status: DC

## 2019-11-15 MED ORDER — ACETAMINOPHEN 325 MG PO TABS
650.0000 mg | ORAL_TABLET | ORAL | Status: DC | PRN
  Administered 2019-11-15 – 2019-11-16 (×3): 650 mg via ORAL
  Filled 2019-11-15 (×4): qty 2

## 2019-11-15 NOTE — Progress Notes (Signed)
ANTICOAGULATION CONSULT NOTE  Pharmacy Consult for IV heparin Indication: chest pain/ACS  Assessment: 69yoM with chest pressure/pain radiating to left arm and jaw. EKG concerning for ischemia but not full STEMI. Not on AC PTA. CBC WNL.  Heparin level 0.46 units/ml  Goal of Therapy:  Heparin level 0.3-0.7 units/ml Monitor platelets by anticoagulation protocol: Yes   Plan:  Cont heparin infusion at 1,300 units/hr Check anti-Xa level in 6 hours to confirm Continue to monitor H&H and platelets  Thanks for allowing pharmacy to be a part of this patient's care.  Excell Seltzer, PharmD Clinical Pharmacist

## 2019-11-15 NOTE — Progress Notes (Signed)
Interval cardiology note: Prior history of NSTEMI with PCI may 2016.  Continue IV heparin, troponin uptrending.  Labs stable.  Plan for cath, earliest probably Tuesday 9/7 due to holiday and early invasive strategy.   Meds: ASA 81 mg, metoprolol 25 mg BID, ramipril 10 mg daily. Difficulty with statins in past, may now need [id number].  Will obtain echo.

## 2019-11-15 NOTE — Progress Notes (Signed)
ANTICOAGULATION CONSULT NOTE  Pharmacy Consult for IV heparin Indication: chest pain/ACS  Allergies  Allergen Reactions   Brilinta [Ticagrelor] Shortness Of Breath    Having breathing issues   Advicor [Niacin-Lovastatin Er] Other (See Comments)    Headache, possibly other reactions   Lipitor [Atorvastatin] Other (See Comments)    Headache, possibly other reactions   Pravachol [Pravastatin Sodium] Other (See Comments)    Headache, possibly other reactions   Rosuvastatin Other (See Comments)     40 MG  TABLET--EXTREME FATIGUE  , MUSCLE  PAIN     Patient Measurements: Height: 5\' 8"  (172.7 cm) Weight: 85.1 kg (187 lb 9.8 oz) IBW/kg (Calculated) : 68.4 Heparin Dosing Weight: 85.4  Vital Signs: Temp: 98.1 F (36.7 C) (09/04 0925) Temp Source: Oral (09/04 0925) BP: 92/60 (09/04 0925) Pulse Rate: 67 (09/04 0925)  Labs: Recent Labs    11/14/19 1913 11/14/19 2040 11/14/19 2249 11/15/19 0044 11/15/19 0158 11/15/19 0739  HGB 17.9*  --   --   --  15.8  --   HCT 53.3*  --   --   --  48.1  --   PLT 253  --   --   --  221  --   LABPROT  --   --   --   --  12.7  --   INR  --   --   --   --  1.0  --   HEPARINUNFRC  --   --   --   --  0.46 0.40  CREATININE 1.17  --   --   --  0.96  --   TROPONINIHS 74*   < > 118* 177*  --  465*   < > = values in this interval not displayed.    Estimated Creatinine Clearance: 78.2 mL/min (by C-G formula based on SCr of 0.96 mg/dL).   Medical History: Past Medical History:  Diagnosis Date   Arthritis    intermittent   CAD in native artery 1994, 2001   a) 2001 Exertional Angina --> Myoview: Inf infarct and Ant ischemia --> Cath: 95% pLAD, 99% D1, 80% RI, but Cx-OM1&2 OK , & patent RCA stent --> CABG: b) Cath in 2003 patent grafts, RCA & Cx; c) 08/2009 Myoview: Mod Basal-Mid inferoseptal, Basal-apical inferior Infarct. No ischemia.; d) May 2016: PCI to Ostial Cx (not bypassed) - Xience DES 3.0 x 15 (3.6 mm)   Childhood asthma     Hemorrhoids    history of   History of ST elevation myocardial infarction (STEMI) of inferior wall 1994; 1997   PCI of RCA - redo PCI BMS ML Vision 3.0 mm x 25 mm to RCA in 1997   Hyperlipidemia LDL goal <70    At goal by Most recent labs October 2013: TC 123, TG 83, HDL 81, LDL 65   Hypertension    Ischemic cardiomyopathy 1994, 2003   Echo February 2014: EF 40-45%; moderate HK of anterolateral myocardium.; Grade 1 diastolic dysfunction   Kidney stones    3 times had lithrotripsy   Obesity (BMI 30.0-34.9)    Pneumonia 2014   S/P CABG x 3 2001   LIMA-LAD, SVG-diagonal, SVG-Ramus Intermedius (RI) - patent by Cath in 2003    Medications:  Scheduled:   [START ON 11/16/2019] aspirin EC  81 mg Oral Daily   metoprolol tartrate  25 mg Oral BID   ramipril  10 mg Oral Daily    Assessment: Benjamin Robertson with chest pressure/pain radiating to left arm  and jaw. EKG concerning for ischemia but not full STEMI. Not on AC PTA.   Confirmatory heparin level this morning was therapeutic. CBC wnl.  Goal of Therapy:  Heparin level 0.3-0.7 units/ml Monitor platelets by anticoagulation protocol: Yes   Plan:  Continue IV heparin at 1300 units/hr Daily heparin level and CBC Monitor for signs and symptoms of bleeding  Fara Olden, PharmD PGY-1 Pharmacy Resident 11/15/2019 9:36 AM  Please check [email or web address] for unit-specific pharmacy phone numbers.

## 2019-11-15 NOTE — H&P (Signed)
Cardiology Admission History and Physical:   Patient ID: Benjamin Robertson; MRN: [id number]; DOB: 10/03/1950   Admission date: 11/14/2019  Primary Care Provider: Venia Carbon, MD Primary Cardiologist: Glenetta Hew, MD    Chief Complaint:  Chest pain  History of Present Illness:   Benjamin Robertson is a 69 y.o. male with a history of CAD (s/p CABG with LIMA-LAD, SVG-Diag, SVG-RI and also PCI's to RCA & ostial LCx), ischemic cardiomyopathy, hypertension, hyperlipidemia, arthritis and tobacco use, who was admitted to the hospital with complaints of chest pain.  Patient's symptoms started at nighttime when he experienced chest and neck pain.  He only had minimal relief with nitroglycerin.  The pain radiated to his back and was associated with nausea and some shortness of breath.  He also described transient lightheadedness.  In the emergency department he was treated with nitroglycerin, IV heparin and morphine.  The electrocardiogram showed minimal ST elevation in lead III with T wave inversions and slight ST depressions in the lateral leads.  The on-call interventional cardiologist was consulted but it was felt that the patient did not meet STEMI criteria.  The high-sensitivity troponins were 74, 81 and 118.  The other labs were as follows: Potassium 4.5, BUN 15, creatinine 1.17, WBC 9.7, hemoglobin 17.9, hematocrit 53.3 and platelets 253.  Covid test was negative.  The chest x-ray was unremarkable.   Past Medical History:  Diagnosis Date  . Arthritis    intermittent  . CAD in native artery 1994, 2001   a) 2001 Exertional Angina --> Myoview: Inf infarct and Ant ischemia --> Cath: 95% pLAD, 99% D1, 80% RI, but Cx-OM1&2 OK , & patent RCA stent --> CABG: b) Cath in 2003 patent grafts, RCA & Cx; c) 08/2009 Myoview: Mod Basal-Mid inferoseptal, Basal-apical inferior Infarct. No ischemia.; d) May 2016: PCI to Ostial Cx (not bypassed) - Xience DES 3.0 x 15 (3.6 mm)  . Childhood asthma   . Hemorrhoids     history of  . History of ST elevation myocardial infarction (STEMI) of inferior wall 1994; 1997   PCI of RCA - redo PCI BMS ML Vision 3.0 mm x 25 mm to RCA in 1997  . Hyperlipidemia LDL goal <70    At goal by Most recent labs October 2013: TC 123, TG 83, HDL 81, LDL 65  . Hypertension   . Ischemic cardiomyopathy 1994, 2003   Echo February 2014: EF 40-45%; moderate HK of anterolateral myocardium.; Grade 1 diastolic dysfunction  . Kidney stones    3 times had lithrotripsy  . Obesity (BMI 30.0-34.9)   . Pneumonia 2014  . S/P CABG x 3 2001   LIMA-LAD, SVG-diagonal, SVG-Ramus Intermedius (RI) - patent by Cath in 2003    Past Surgical History:  Procedure Laterality Date  . CARDIAC CATHETERIZATION  06/06/1999   Recommend CABG  . CARDIAC CATHETERIZATION  10/04/2001   Patent grafts with a 50% lesion in LAD beyond IMA insertion  . CARDIAC CATHETERIZATION N/A 07/31/2014   Procedure: Left Heart Cath and Cors/Grafts Angiography;  Surgeon: Jettie Booze, MD;  Location: [facility];  Service: Cardiovascular;  Laterality: N/A;  . CARDIAC CATHETERIZATION N/A 07/31/2014   Procedure: Coronary Stent Intervention;  Surgeon: Jettie Booze, MD;  Location: [facility];  Service: Cardiovascular: PCI to Ostial Cx (not bypassed) - Xience DES 3.0 x 15 (3.6 mm)  . CAROTID DOPPLER  05/06/2012   Less than 50% diameter reduction of the Lft Subclavian.  . CORONARY  ANGIOPLASTY WITH STENT PLACEMENT  01/21/1996   PTCA of RCA-95% lesion/per wife 2015  . CORONARY ARTERY BYPASS GRAFT  06/07/1999   x3. LIMA to distal LAD, SVG to first diag, and SVG to ramus  . HYDROCELE EXCISION Left 02/28/2016   Procedure: HYDROCELECTOMY ADULT;  Surgeon: Kathie Rhodes, MD;  Location: [facility];  Service: Urology;  Laterality: Left;  . LITHOTRIPSY  "2-3 times"   1988/2003/2007  . NM MYOVIEW LTD  08/19/2009   Moderafte perfusion seen in Basal inferoseptal, Basal inferior, Mid inferoseptal, Mid inferiorm, and Apical  inferior regions. No ECG changes. EKG negative for ischemia.  . TRANSTHORACIC ECHOCARDIOGRAM  05/06/2012   EF 40-45%, LV cavity moderately dilated, systolic function mild-moderately reduced, moderate hypokinesis of anterolatewral myocardium.     Medications Prior to Admission: Prior to Admission medications   Medication Sig Start Date End Date Taking? Authorizing Provider  albuterol (VENTOLIN HFA) 108 (90 Base) MCG/ACT inhaler Inhale 2 puffs into the lungs every 6 (six) hours as needed for wheezing or shortness of breath. 10/06/19  Yes Venia Carbon, MD  aspirin 325 MG tablet Take 325 mg by mouth daily.   Yes [provider]  Evolocumab (REPATHA SURECLICK) 299 MG/ML SOAJ Inject 140 mg into the skin every 14 (fourteen) days. 10/21/19  Yes Leonie Man, MD  Magnesium Oxide 250 MG TABS Take 1 tablet by mouth daily.    Yes [provider]  metoprolol tartrate (LOPRESSOR) 50 MG tablet TAKE ONE-HALF TABLET BY  MOUTH TWICE DAILY 10/27/19  Yes Leonie Man, MD  naproxen sodium (ALEVE) 220 MG tablet Take 220 mg by mouth daily as needed (pain).    Yes [provider]  nitroGLYCERIN (NITROLINGUAL) 0.4 MG/SPRAY spray Place 1 spray under the tongue every 5 (five) minutes x 3 doses as needed for chest pain. 03/03/19  Yes Leonie Man, MD  ramipril (ALTACE) 10 MG capsule TAKE 1 CAPSULE BY MOUTH  DAILY 12/17/18  Yes Leonie Man, MD  Tiotropium Bromide Monohydrate (SPIRIVA RESPIMAT) 1.25 MCG/ACT AERS Take 1 puff by mouth daily. 09/09/18  Yes Venia Carbon, MD  varenicline (CHANTIX PAK) 0.5 MG X 11 & 1 MG X 42 tablet Take one 0.5 mg tablet by mouth once daily for 3 days, then increase to one 0.5 mg tablet twice daily for 4 days, then increase to one 1 mg tablet twice daily. Patient not taking: Reported on 11/14/2019 10/06/19   Venia Carbon, MD  varenicline (CHANTIX) 1 MG tablet Take 1 tablet (1 mg total) by mouth 2 (two) times daily. Patient not taking: Reported on  11/14/2019 10/06/19   Venia Carbon, MD     Allergies:    Allergies  Allergen Reactions  . Brilinta [Ticagrelor] Shortness Of Breath    Having breathing issues  . Advicor [Niacin-Lovastatin Er] Other (See Comments)    Headache, possibly other reactions  . Lipitor [Atorvastatin] Other (See Comments)    Headache, possibly other reactions  . Pravachol [Pravastatin Sodium] Other (See Comments)    Headache, possibly other reactions  . Rosuvastatin Other (See Comments)     40 MG  TABLET--EXTREME FATIGUE  , MUSCLE  PAIN     Social History:   Social History   Socioeconomic History  . Marital status: Married    Spouse name: Not on file  . Number of children: 2  . Years of education: Not on file  . Highest education level: Not on file  Occupational History  . 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 Smoking status: Current Every Day Smoker    Packs/day: 0.50    Years: 48.00    Pack years: 24.00    Types: Cigarettes  . Smokeless tobacco: Never Used  Substance and Sexual Activity  . Alcohol use: No  . Drug use: No  . Sexual activity: Never  Other Topics Concern  . Not on file  Social History Narrative   Married father of 2 with one grandchild. By his wife.   Continues to smoke one half pack a day.   Very physically active at work but no routine exercise.      No living will    Wife would make decisions for him if needed. Sons would be alternates   Would accept resuscitation   Doesn't want prolonged machines or intervention   Social Determinants of Health   Financial Resource Strain:   . Difficulty of Paying Living Expenses: Not on file  Food Insecurity:   . Worried About Charity fundraiser in the Last Year: Not on file  . Ran Out of Food in the Last Year: Not on file  Transportation Needs:   . Lack of Transportation (Medical): Not on file  . Lack of Transportation (Non-Medical): Not on file  Physical Activity:   . Days of  Exercise per Week: Not on file  . Minutes of Exercise per Session: Not on file  Stress:   . Feeling of Stress : Not on file  Social Connections:   . Frequency of Communication with Friends and Family: Not on file  . Frequency of Social Gatherings with Friends and Family: Not on file  . Attends Religious Services: Not on file  . Active Member of Clubs or Organizations: Not on file  . Attends Archivist Meetings: Not on file  . Marital Status: Not on file  Intimate Partner Violence:   . Fear of Current or Ex-Partner: Not on file  . Emotionally Abused: Not on file  . Physically Abused: Not on file  . Sexually Abused: Not on file     Family History:  The patient's family history includes Heart attack in his mother; Heart disease in his mother; Lung cancer in his sister; Lymphoma in his sister; Stroke in his sister.     Review of Systems: [y] = yes, [ ]  = no   . General: Weight gain [ ] ; Weight loss [ ] ; Anorexia [ ] ; Fatigue [ ] ; Fever [ ] ; Chills [ ] ; Weakness [ ]   . Cardiac: Chest pain/pressure [Y]; Resting SOB [Y]; Exertional SOB [ ] ; Orthopnea [ ] ; Pedal Edema [ ] ; Palpitations [ ] ; Syncope [ ] ; Presyncope [ ] ; Paroxysmal nocturnal dyspnea[ ]   . Pulmonary: Cough [ ] ; Wheezing[ ] ; Hemoptysis[ ] ; Sputum [ ] ; Snoring [ ]   . GI: Vomiting[ ] ; Dysphagia[ ] ; Melena[ ] ; Hematochezia [ ] ; Heartburn[ ] ; Abdominal pain [Y]; Constipation [ ] ; Diarrhea [ ] ; BRBPR [ ]   . GU: Hematuria[ ] ; Dysuria [ ] ; Nocturia[ ]   . Vascular: Pain in legs with walking [ ] ; Pain in feet with lying flat [ ] ; Non-healing sores [ ] ; Stroke [ ] ; TIA [ ] ; Slurred speech [ ] ;  . Neuro: Headaches[Y]; Vertigo[ ] ; Seizures[ ] ; Paresthesias[ ] ;Blurred vision [ ] ; Diplopia [ ] ; Vision changes [ ]   . Ortho/Skin: Arthritis [ ] ; Joint pain [ ] ; Muscle pain [ ] ; Joint swelling [ ] ; Back Pain [ ] ; Rash [ ]   .  Psych: Depression[ ] ; Anxiety[ ]   . Heme: Bleeding problems [ ] ; Clotting disorders [ ] ; Anemia [ ]    . Endocrine: Diabetes [ ] ; Thyroid dysfunction[ ]      Physical Exam/Data:   Vitals:   11/14/19 2245 11/14/19 2300 11/14/19 2315 11/14/19 2330  BP: 135/81 135/82 (!) 120/94 137/74  Pulse: 77 74 82 82  Resp: 20 16 20 17   Temp:      TempSrc:      SpO2:  94% 92%   Weight:      Height:       No intake or output data in the 24 hours ending 11/15/19 0005 Filed Weights   11/14/19 1911  Weight: 88.5 kg   Body mass index is 29.65 kg/m.  General:  Well nourished, well developed, in mild discomfort HEENT: normal Lymph: no adenopathy Neck: no JVD Endocrine:  No thryomegaly Vascular: No carotid bruits; FA pulses 2+ bilaterally without bruits  Cardiac:  normal S1, S2; RRR; no murmur  Lungs:  clear to auscultation bilaterally, no wheezing, rhonchi or rales  Abd: soft, nontender, no hepatomegaly  Ext: no edema Musculoskeletal:  No deformities, BUE and BLE strength normal and equal Skin: warm and dry  Neuro:  CNs 2-12 intact, no focal abnormalities noted Psych:  Normal affect     Laboratory Data:  Chemistry Recent Labs  Lab 11/14/19 1913  NA 139  K 4.5  CL 102  CO2 26  GLUCOSE 148*  BUN 15  CREATININE 1.17  CALCIUM 9.8  GFRNONAA >60  GFRAA >60  ANIONGAP 11    Recent Labs  Lab 11/14/19 2040  PROT 6.9  ALBUMIN 4.0  AST 20  ALT 16  ALKPHOS 62  BILITOT 0.8   Hematology Recent Labs  Lab 11/14/19 1913  WBC 9.7  RBC 5.66  HGB 17.9*  HCT 53.3*  MCV 94.2  MCH 31.6  MCHC 33.6  RDW 13.4  PLT 253   Cardiac EnzymesNo results for input(s): TROPONINI in the last 168 hours. No results for input(s): TROPIPOC in the last 168 hours.  BNPNo results for input(s): BNP, PROBNP in the last 168 hours.  DDimer No results for input(s): DDIMER in the last 168 hours.  Radiology/Studies:  DG Chest 2 View  Result Date: 11/14/2019 CLINICAL DATA:  Chest pain EXAM: CHEST - 2 VIEW COMPARISON:  01/29/2015 FINDINGS: Lungs are clear. No pneumothorax or pleural effusion. Coronary  artery bypass grafting has been performed. Cardiac size within normal limits. The pulmonary vascularity is normal. Degenerative changes are seen within the thoracic spine. IMPRESSION: No acute process in the chest. Electronically Signed   By: Fidela Salisbury MD   On: 11/14/2019 19:33    Assessment and Plan:   1. Non-ST elevation myocardial infarction Patient has known CAD with prior CABG and multiple PCI's.  He presents with typical chest pain and EKG changes suggestive of ischemia.  The high-sensitivity troponins are abnormal.  -Continue to trend cardiac biomarkers -Serial ECGs -Aspirin 81 mg daily -Check a lipid panel (patient has a history of intolerance to statins) -Nitroglycerin PRN -Unfractionated heparin IV infusion -Continue metoprolol and ACE inhibitor  Patient will likely require coronary and graft angiography on this hospital admission.  This will help identify the culprit lesion for the patient's presentation.  We will go ahead and admit the patient to the cardiology service.    Severity of Illness: The appropriate patient status for this patient is INPATIENT. Inpatient status is judged to be reasonable and necessary in order to  provide the required intensity of service to ensure the patient's safety. The patient's presenting symptoms, physical exam findings, and initial radiographic and laboratory data in the context of their chronic comorbidities is felt to place them at high risk for further clinical deterioration. Furthermore, it is not anticipated that the patient will be medically stable for discharge from the hospital within 2 midnights of admission. The following factors support the patient status of inpatient.   " The patient's presenting symptoms include chest pain. " The worrisome physical exam findings include abnormal ECG. " The initial radiographic and laboratory data are worrisome because of elevated troponin. " The chronic co-morbidities include hypertension and  hyperlipidemia.   * I certify that at the point of admission it is my clinical judgment that the patient will require inpatient hospital care spanning beyond 2 midnights from the point of admission due to high intensity of service, high risk for further deterioration and high frequency of surveillance required.*    For questions or updates, please contact [facility] Please consult [email or web address] for contact info under Cardiology/STEMI.    Signed, Meade Maw, MD  11/15/2019 12:05 AM

## 2019-11-15 NOTE — ED Notes (Signed)
Date and time results received: 11/15/19 0009 (use smartphrase ".now" to insert current time)  Test: troponin Critical Value: 118  Name of Provider Notified: Akhter  Orders Received? Or Actions Taken?: waiting response

## 2019-11-15 NOTE — Progress Notes (Signed)
   Spoke to pt wife.  She is concerned about pt, says they have not been told anything.   Advised that last tropH was the highest.  No CP or SOB on the heparin and IV Nitro.  Advised that the cath lab is not open on Monday because of the holiday. He is stable enough not to be an emergent cath, but that means he has to wait till Tuesday.   Advised that we cannot send him home for outpt eval. Feel strongly that he might have more pain if off the IV rx.  Wife states she will speak to him, requests Dr Ellyn Hack be informed.   Sent note to him, will assign him as a round on Monday.  Rosaria Ferries, PA-C 11/15/2019 12:29 PM

## 2019-11-15 NOTE — Progress Notes (Signed)
*  PRELIMINARY RESULTS* Echocardiogram 2D Echocardiogram has been performed.  Samuel Germany 11/15/2019, 4:00 PM

## 2019-11-15 NOTE — ED Notes (Signed)
Dr. Paticia Stack at bedside states pt is ok to get PO metoprolol

## 2019-11-16 ENCOUNTER — Encounter (HOSPITAL_COMMUNITY)
Admission: EM | Disposition: A | Discharge status: Home / Self Care | Payer: Self-pay | Source: Home / Self Care | Attending: Internal Medicine

## 2019-11-16 DIAGNOSIS — I25110 Atherosclerotic heart disease of native coronary artery with unstable angina pectoris: Principal | ICD-10-CM

## 2019-11-16 DIAGNOSIS — I249 Acute ischemic heart disease, unspecified: Secondary | ICD-10-CM

## 2019-11-16 HISTORY — PX: TRANSTHORACIC ECHOCARDIOGRAM: SHX275

## 2019-11-16 HISTORY — PX: CORONARY STENT INTERVENTION: CATH118234

## 2019-11-16 HISTORY — PX: CORONARY/GRAFT ACUTE MI REVASCULARIZATION: CATH118305

## 2019-11-16 HISTORY — PX: LEFT HEART CATH AND CORS/GRAFTS ANGIOGRAPHY: CATH118250

## 2019-11-16 LAB — CBC
HCT: 44.5 % (ref 39.0–52.0)
Hemoglobin: 14.7 g/dL (ref 13.0–17.0)
MCH: 31 pg (ref 26.0–34.0)
MCHC: 33 g/dL (ref 30.0–36.0)
MCV: 93.9 fL (ref 80.0–100.0)
Platelets: 197 10*3/uL (ref 150–400)
RBC: 4.74 MIL/uL (ref 4.22–5.81)
RDW: 13.2 % (ref 11.5–15.5)
WBC: 8.2 10*3/uL (ref 4.0–10.5)
nRBC: 0 % (ref 0.0–0.2)

## 2019-11-16 LAB — TROPONIN I (HIGH SENSITIVITY)
Troponin I (High Sensitivity): 506 ng/L (ref <18)
Troponin I (High Sensitivity): 650 ng/L (ref <18)

## 2019-11-16 LAB — SURGICAL PCR SCREEN
MRSA, PCR: NEGATIVE
Staphylococcus aureus: NEGATIVE

## 2019-11-16 SURGERY — CORONARY STENT INTERVENTION
Anesthesia: LOCAL

## 2019-11-16 MED ORDER — SODIUM CHLORIDE 0.9 % IV SOLN: 250.0000 mL | INTRAVENOUS | Status: DC | PRN

## 2019-11-16 MED ORDER — BIVALIRUDIN BOLUS VIA INFUSION - CUPID
INTRAVENOUS | Status: DC | PRN
  Administered 2019-11-16: 64.65 mg via INTRAVENOUS

## 2019-11-16 MED ORDER — NITROGLYCERIN 1 MG/10 ML FOR IR/CATH LAB
INTRA_ARTERIAL | Status: DC | PRN
  Administered 2019-11-16 (×3): 200 ug via INTRACORONARY

## 2019-11-16 MED ORDER — HEPARIN (PORCINE) IN NACL 1000-0.9 UT/500ML-% IV SOLN
INTRAVENOUS | Status: AC
  Filled 2019-11-16: qty 1000

## 2019-11-16 MED ORDER — SODIUM CHLORIDE 0.9% FLUSH
3.0000 mL | INTRAVENOUS | Status: DC | PRN
  Administered 2019-11-16: 3 mL via INTRAVENOUS

## 2019-11-16 MED ORDER — HYDRALAZINE HCL 20 MG/ML IJ SOLN: 10.0000 mg | INTRAMUSCULAR | Status: AC | PRN

## 2019-11-16 MED ORDER — ASPIRIN 81 MG PO CHEW
CHEWABLE_TABLET | ORAL | Status: AC
  Filled 2019-11-16: qty 1

## 2019-11-16 MED ORDER — LABETALOL HCL 5 MG/ML IV SOLN: 10.0000 mg | INTRAVENOUS | Status: AC | PRN

## 2019-11-16 MED ORDER — SODIUM CHLORIDE 0.9 % WEIGHT BASED INFUSION: 1.0000 mL/kg/h | INTRAVENOUS | Status: DC

## 2019-11-16 MED ORDER — SODIUM CHLORIDE 0.9% FLUSH: 3.0000 mL | Freq: Two times a day (BID) | INTRAVENOUS | Status: DC

## 2019-11-16 MED ORDER — CHLORHEXIDINE GLUCONATE CLOTH 2 % EX PADS
6.0000 | MEDICATED_PAD | Freq: Every day | CUTANEOUS | Status: DC
  Administered 2019-11-16 – 2019-11-17 (×2): 6 via TOPICAL

## 2019-11-16 MED ORDER — ASPIRIN 81 MG PO CHEW
81.0000 mg | CHEWABLE_TABLET | ORAL | Status: AC
  Administered 2019-11-16: 81 mg via ORAL

## 2019-11-16 MED ORDER — BIVALIRUDIN TRIFLUOROACETATE 250 MG IV SOLR
INTRAVENOUS | Status: AC
  Filled 2019-11-16: qty 250

## 2019-11-16 MED ORDER — IOHEXOL 350 MG/ML SOLN
INTRAVENOUS | Status: AC
  Filled 2019-11-16: qty 1

## 2019-11-16 MED ORDER — MIDAZOLAM HCL 2 MG/2ML IJ SOLN
INTRAMUSCULAR | Status: AC
  Filled 2019-11-16: qty 2

## 2019-11-16 MED ORDER — SODIUM CHLORIDE 0.9 % IV SOLN
INTRAVENOUS | Status: AC | PRN
  Administered 2019-11-16 (×2): 1.75 mg/kg/h via INTRAVENOUS

## 2019-11-16 MED ORDER — IOHEXOL 350 MG/ML SOLN
INTRAVENOUS | Status: DC | PRN
  Administered 2019-11-16: 205 mL

## 2019-11-16 MED ORDER — SODIUM CHLORIDE 0.9 % IV SOLN
1.7500 mg/kg/h | INTRAVENOUS | Status: AC
  Filled 2019-11-16: qty 250

## 2019-11-16 MED ORDER — CLOPIDOGREL BISULFATE 300 MG PO TABS
ORAL_TABLET | ORAL | Status: DC | PRN
  Administered 2019-11-16: 600 mg via ORAL

## 2019-11-16 MED ORDER — CLOPIDOGREL BISULFATE 75 MG PO TABS
75.0000 mg | ORAL_TABLET | Freq: Every day | ORAL | Status: DC
  Administered 2019-11-17 – 2019-11-19 (×3): 75 mg via ORAL
  Filled 2019-11-16 (×3): qty 1

## 2019-11-16 MED ORDER — ALPRAZOLAM 0.5 MG PO TABS
1.0000 mg | ORAL_TABLET | Freq: Two times a day (BID) | ORAL | Status: DC | PRN
  Administered 2019-11-17: 1 mg via ORAL
  Filled 2019-11-16: qty 2

## 2019-11-16 MED ORDER — ASPIRIN 81 MG PO CHEW
81.0000 mg | CHEWABLE_TABLET | Freq: Every day | ORAL | Status: DC
  Administered 2019-11-17: 81 mg via ORAL
  Filled 2019-11-16: qty 1

## 2019-11-16 MED ORDER — HEPARIN (PORCINE) IN NACL 1000-0.9 UT/500ML-% IV SOLN
INTRAVENOUS | Status: DC | PRN
  Administered 2019-11-16 (×2): 500 mL

## 2019-11-16 MED ORDER — HEPARIN (PORCINE) 25000 UT/250ML-% IV SOLN
1400.0000 [IU]/h | INTRAVENOUS | Status: DC
  Administered 2019-11-16: 1300 [IU]/h via INTRAVENOUS
  Administered 2019-11-18: 1400 [IU]/h via INTRAVENOUS
  Filled 2019-11-16 (×3): qty 250

## 2019-11-16 MED ORDER — NITROGLYCERIN IN D5W 200-5 MCG/ML-% IV SOLN
0.0000 ug/min | INTRAVENOUS | Status: DC
  Administered 2019-11-17: 50 ug/min via INTRAVENOUS
  Filled 2019-11-16 (×3): qty 250

## 2019-11-16 MED ORDER — FENTANYL CITRATE (PF) 100 MCG/2ML IJ SOLN
INTRAMUSCULAR | Status: AC
Start: 2019-11-16 — End: ?
  Filled 2019-11-16: qty 2

## 2019-11-16 MED ORDER — LIDOCAINE HCL (PF) 1 % IJ SOLN
INTRAMUSCULAR | Status: DC | PRN
  Administered 2019-11-16: 15 mL

## 2019-11-16 MED ORDER — UMECLIDINIUM BROMIDE 62.5 MCG/INH IN AEPB
1.0000 | INHALATION_SPRAY | Freq: Every day | RESPIRATORY_TRACT | Status: DC
  Administered 2019-11-16 – 2019-11-17 (×2): 1 via RESPIRATORY_TRACT
  Filled 2019-11-16: qty 7

## 2019-11-16 MED ORDER — ALBUTEROL SULFATE (2.5 MG/3ML) 0.083% IN NEBU: 2.5000 mg | INHALATION_SOLUTION | Freq: Four times a day (QID) | RESPIRATORY_TRACT | Status: DC | PRN

## 2019-11-16 MED ORDER — SODIUM CHLORIDE 0.9 % IV SOLN: INTRAVENOUS | Status: DC

## 2019-11-16 MED ORDER — FENTANYL CITRATE (PF) 100 MCG/2ML IJ SOLN
INTRAMUSCULAR | Status: DC | PRN
Start: 2019-11-16 — End: 2019-11-16
  Administered 2019-11-16 (×2): 25 ug via INTRAVENOUS
  Administered 2019-11-16: 50 ug via INTRAVENOUS

## 2019-11-16 MED ORDER — ARFORMOTEROL TARTRATE 15 MCG/2ML IN NEBU
15.0000 ug | INHALATION_SOLUTION | Freq: Two times a day (BID) | RESPIRATORY_TRACT | Status: DC
  Administered 2019-11-16 – 2019-11-19 (×5): 15 ug via RESPIRATORY_TRACT
  Filled 2019-11-16 (×6): qty 2

## 2019-11-16 MED ORDER — MIDAZOLAM HCL 2 MG/2ML IJ SOLN
INTRAMUSCULAR | Status: DC | PRN
  Administered 2019-11-16: 1 mg via INTRAVENOUS
  Administered 2019-11-16: 2 mg via INTRAVENOUS
  Administered 2019-11-16: 1 mg via INTRAVENOUS

## 2019-11-16 MED ORDER — SODIUM CHLORIDE 0.9 % IV SOLN
250.0000 mL | INTRAVENOUS | Status: DC | PRN
  Administered 2019-11-16: 250 mL via INTRAVENOUS

## 2019-11-16 MED ORDER — SODIUM CHLORIDE 0.9% FLUSH
3.0000 mL | Freq: Two times a day (BID) | INTRAVENOUS | Status: DC
  Administered 2019-11-16 – 2019-11-17 (×3): 3 mL via INTRAVENOUS

## 2019-11-16 MED ORDER — TIOTROPIUM BROMIDE MONOHYDRATE 1.25 MCG/ACT IN AERS: 1.0000 | INHALATION_SPRAY | Freq: Every day | RESPIRATORY_TRACT | Status: DC

## 2019-11-16 MED ORDER — ACETAMINOPHEN 325 MG PO TABS
650.0000 mg | ORAL_TABLET | ORAL | Status: DC | PRN
  Administered 2019-11-16 – 2019-11-18 (×6): 650 mg via ORAL
  Filled 2019-11-16 (×5): qty 2

## 2019-11-16 MED ORDER — SODIUM CHLORIDE 0.9% FLUSH: 3.0000 mL | INTRAVENOUS | Status: DC | PRN

## 2019-11-16 MED ORDER — ONDANSETRON HCL 4 MG/2ML IJ SOLN: 4.0000 mg | Freq: Four times a day (QID) | INTRAMUSCULAR | Status: DC | PRN

## 2019-11-16 MED ORDER — CLOPIDOGREL BISULFATE 300 MG PO TABS
ORAL_TABLET | ORAL | Status: AC
  Filled 2019-11-16: qty 2

## 2019-11-16 MED ORDER — SODIUM CHLORIDE 0.9 % WEIGHT BASED INFUSION
3.0000 mL/kg/h | INTRAVENOUS | Status: AC
  Administered 2019-11-16: 3 mL/kg/h via INTRAVENOUS

## 2019-11-16 MED ORDER — NITROGLYCERIN 1 MG/10 ML FOR IR/CATH LAB
INTRA_ARTERIAL | Status: AC
  Filled 2019-11-16: qty 10

## 2019-11-16 MED ORDER — HEPARIN SODIUM (PORCINE) 1000 UNIT/ML IJ SOLN
INTRAMUSCULAR | Status: AC
  Filled 2019-11-16: qty 1

## 2019-11-16 MED ORDER — LIDOCAINE HCL (PF) 1 % IJ SOLN
INTRAMUSCULAR | Status: AC
  Filled 2019-11-16: qty 30

## 2019-11-16 SURGICAL SUPPLY — 18 items
BALLN SAPPHIRE 2.0X12 (BALLOONS) ×2
BALLN SAPPHIRE ~~LOC~~ 2.75X18 (BALLOONS) ×1 IMPLANT
BALLOON SAPPHIRE 2.0X12 (BALLOONS) IMPLANT
CATH INFINITI 5 FR IM (CATHETERS) ×1 IMPLANT
CATH INFINITI 5FR MULTPACK ANG (CATHETERS) ×1 IMPLANT
CATH VISTA GUIDE 6FR JR4 (CATHETERS) ×1 IMPLANT
GLIDESHEATH SLEND SS 6F .021 (SHEATH) ×1 IMPLANT
GUIDEWIRE INQWIRE 1.5J.035X260 (WIRE) IMPLANT
INQWIRE 1.5J .035X260CM (WIRE) ×2
KIT ENCORE 26 ADVANTAGE (KITS) ×1 IMPLANT
KIT HEART LEFT (KITS) ×2 IMPLANT
PACK CARDIAC CATHETERIZATION (CUSTOM PROCEDURE TRAY) ×2 IMPLANT
SHEATH PINNACLE 6F 10CM (SHEATH) ×1 IMPLANT
STENT RESOLUTE ONYX 2.5X30 (Permanent Stent) ×1 IMPLANT
TRANSDUCER W/STOPCOCK (MISCELLANEOUS) ×2 IMPLANT
TUBING CIL FLEX 10 FLL-RA (TUBING) ×2 IMPLANT
WIRE COUGAR XT STRL 190CM (WIRE) ×1 IMPLANT
WIRE EMERALD 3MM-J .035X150CM (WIRE) ×1 IMPLANT

## 2019-11-16 NOTE — H&P (View-Only) (Signed)
Progress Note  Patient Name: Benjamin Robertson Date of Encounter: 11/16/2019  Primary Cardiologist:  Glenetta Hew, MD  Subjective   8/10 chest and throat pain  Inpatient Medications    Scheduled Meds:  aspirin EC  81 mg Oral Daily   metoprolol tartrate  25 mg Oral BID   ramipril  10 mg Oral Daily   Continuous Infusions:  heparin 1,300 Units/hr (11/15/19 1115)   nitroGLYCERIN 55 mcg/min (11/16/19 0738)   PRN Meds: acetaminophen, morphine injection, nitroGLYCERIN, ondansetron (ZOFRAN) IV   Vital Signs    Vitals:   11/16/19 0723 11/16/19 0737 11/16/19 0742 11/16/19 0743  BP: (!) 157/125 114/63 98/62   Pulse: 70 70 69 70  Resp:      Temp:      TempSrc:      SpO2: (!) 89% (!) 89% 91% 96%  Weight:      Height:        Intake/Output Summary (Last 24 hours) at 11/16/2019 0809 Last data filed at 11/16/2019 0514 Gross per 24 hour  Intake 1075.95 ml  Output 225 ml  Net 850.95 ml   Filed Weights   11/15/19 0106 11/15/19 0530 11/16/19 0300  Weight: 85.4 kg 85.1 kg 86.2 kg   Last Weight  Most recent update: 11/16/2019  3:04 AM   Weight  86.2 kg (190 lb)           Weight change: -2.268 kg   Telemetry    SR - Personally Reviewed  ECG    SR - Personally Reviewed  Physical Exam   GEN: chest pain   Neck: No JVD Cardiac: RRR, no murmurs, rubs, or gallops.  Respiratory: Clear to auscultation bilaterally. GI: Soft, nontender, non-distended  MS: No edema; No deformity. Neuro:  Nonfocal  Psych: Normal affect   Labs    Hematology Recent Labs  Lab 11/14/19 1913 11/15/19 0158  WBC 9.7 8.8  RBC 5.66 5.15  HGB 17.9* 15.8  HCT 53.3* 48.1  MCV 94.2 93.4  MCH 31.6 30.7  MCHC 33.6 32.8  RDW 13.4 13.2  PLT 253 221    Chemistry Recent Labs  Lab 11/14/19 1913 11/14/19 2040 11/15/19 0158  NA 139  --  139  K 4.5  --  4.2  CL 102  --  104  CO2 26  --  26  GLUCOSE 148*  --  123*  BUN 15  --  15  CREATININE 1.17  --  0.96  CALCIUM 9.8  --  9.3  PROT   --  6.9  --   ALBUMIN  --  4.0  --   AST  --  20  --   ALT  --  16  --   ALKPHOS  --  62  --   BILITOT  --  0.8  --   GFRNONAA >60  --  >60  GFRAA >60  --  >60  ANIONGAP 11  --  9     High Sensitivity Troponin:   Recent Labs  Lab 11/14/19 1913 11/14/19 2040 11/14/19 2249 11/15/19 0044 11/15/19 0739  TROPONINIHS 74* 81* 118* 177* 465*      BNPNo results for input(s): BNP, PROBNP in the last 168 hours.   DDimer No results for input(s): DDIMER in the last 168 hours.   Radiology    DG Chest 2 View  Result Date: 11/14/2019 CLINICAL DATA:  Chest pain EXAM: CHEST - 2 VIEW COMPARISON:  01/29/2015 FINDINGS: Lungs are clear. No pneumothorax or pleural effusion.  Coronary artery bypass grafting has been performed. Cardiac size within normal limits. The pulmonary vascularity is normal. Degenerative changes are seen within the thoracic spine. IMPRESSION: No acute process in the chest. Electronically Signed   By: Fidela Salisbury MD   On: 11/14/2019 19:33   ECHOCARDIOGRAM COMPLETE  Result Date: 11/15/2019    ECHOCARDIOGRAM REPORT   Patient Name:   [name] Date of Exam: 11/15/2019 Medical Rec #:  [id number]    Height:       68.0 in Accession #:    [id number]   Weight:       187.6 lb Date of Birth:  April 09, 1950     BSA:          1.989 m Patient Age:    23 years     BP:           92/60 mmHg Patient Gender: M            HR:           67 bpm. Exam Location:  Inpatient Procedure: 2D Echo, Cardiac Doppler and Color Doppler Indications:    NSTEMI I21.4  History:        Patient has prior history of Echocardiogram examinations, most                 recent 05/06/2012. Cardiomyopathy, CAD, Prior CABG, COPD; Risk                 Factors:Hypertension, Dyslipidemia and Current Smoker. ST                 elevation myocardial infarction (STEMI) of inferior wall (HCC)                 (From Hx).  Sonographer:    Alvino Chapel RCS Referring Phys: [id number] Bibb  1. Left ventricular ejection  fraction, by estimation, is 55 to 60%. The left ventricle has normal function. Left ventricular endocardial border not optimally defined to evaluate regional wall motion, but appears grossly normal. Left ventricular diastolic parameters are consistent with Grade I diastolic dysfunction (impaired relaxation).  2. Right ventricular systolic function is mildly reduced. The right ventricular size is normal. Tricuspid regurgitation signal is inadequate for assessing PA pressure.  3. The mitral valve is normal in structure. Trivial mitral valve regurgitation. No evidence of mitral stenosis.  4. The aortic valve is normal in structure. Aortic valve regurgitation is trivial. No aortic stenosis is present.  5. The inferior vena cava is normal in size with greater than 50% respiratory variability, suggesting right atrial pressure of 3 mmHg. FINDINGS  Left Ventricle: Left ventricular ejection fraction, by estimation, is 55 to 60%. The left ventricle has normal function. Left ventricular endocardial border not optimally defined to evaluate regional wall motion. The left ventricular internal cavity size was normal in size. There is no left ventricular hypertrophy. Left ventricular diastolic parameters are consistent with Grade I diastolic dysfunction (impaired relaxation). Right Ventricle: The right ventricular size is normal. No increase in right ventricular wall thickness. Right ventricular systolic function is mildly reduced. Tricuspid regurgitation signal is inadequate for assessing PA pressure. Left Atrium: Left atrial size was normal in size. Right Atrium: Right atrial size was normal in size. Pericardium: There is no evidence of pericardial effusion. Mitral Valve: The mitral valve is normal in structure. Normal mobility of the mitral valve leaflets. Trivial mitral valve regurgitation. No evidence of mitral valve stenosis. Tricuspid Valve: The tricuspid valve is normal in  structure. Tricuspid valve regurgitation is  trivial. No evidence of tricuspid stenosis. Aortic Valve: The aortic valve is normal in structure. Aortic valve regurgitation is trivial. No aortic stenosis is present. Pulmonic Valve: The pulmonic valve was normal in structure. Pulmonic valve regurgitation is mild to moderate. No evidence of pulmonic stenosis. Aorta: The aortic root is normal in size and structure. Venous: The inferior vena cava is normal in size with greater than 50% respiratory variability, suggesting right atrial pressure of 3 mmHg. IAS/Shunts: No atrial level shunt detected by color flow Doppler.  LEFT VENTRICLE PLAX 2D LVIDd:         4.80 cm  Diastology LVIDs:         3.00 cm  LV e' lateral:   8.59 cm/s LV PW:         1.00 cm  LV E/e' lateral: 4.6 LV IVS:        0.90 cm  LV e' medial:    7.07 cm/s LVOT diam:     2.10 cm  LV E/e' medial:  5.5 LV SV:         55 LV SV Index:   28 LVOT Area:     3.46 cm  RIGHT VENTRICLE RV S prime:     6.32 cm/s TAPSE (M-mode): 1.8 cm LEFT ATRIUM             Index       RIGHT ATRIUM           Index LA diam:        4.10 cm 2.06 cm/m  RA Area:     17.70 cm LA Vol (A2C):   39.8 ml 20.01 ml/m RA Volume:   51.10 ml  25.69 ml/m LA Vol (A4C):   45.8 ml 23.03 ml/m LA Biplane Vol: 44.8 ml 22.52 ml/m  AORTIC VALVE LVOT Vmax:   92.40 cm/s LVOT Vmean:  53.000 cm/s LVOT VTI:    0.159 m  AORTA Ao Root diam: 3.50 cm MITRAL VALVE MV Area (PHT): 3.39 cm    SHUNTS MV Decel Time: 224 msec    Systemic VTI:  0.16 m MV E velocity: 39.20 cm/s  Systemic Diam: 2.10 cm Cherlynn Kaiser MD Electronically signed by Cherlynn Kaiser MD Signature Date/Time: 11/15/2019/5:36:23 PM    Final      Cardiac Studies   Echo 9/4 1. Left ventricular ejection fraction, by estimation, is 55 to 60%. The  left ventricle has normal function. Left ventricular endocardial border  not optimally defined to evaluate regional wall motion, but appears  grossly normal. Left ventricular  diastolic parameters are consistent with Grade I diastolic  dysfunction  (impaired relaxation).  2. Right ventricular systolic function is mildly reduced. The right  ventricular size is normal. Tricuspid regurgitation signal is inadequate  for assessing PA pressure.  3. The mitral valve is normal in structure. Trivial mitral valve  regurgitation. No evidence of mitral stenosis.  4. The aortic valve is normal in structure. Aortic valve regurgitation is  trivial. No aortic stenosis is present.  5. The inferior vena cava is normal in size with greater than 50%  respiratory variability, suggesting right atrial pressure of 3 mmHg.   Patient Profile     69 y.o. male w/ hx of CAD (s/p CABG with LIMA-LAD, SVG-Diag, SVG-RI and also PCI's to RCA & ostial LCx), ischemic cardiomyopathy, hypertension, hyperlipidemia, arthritis and tobacco use, who was admitted to the hospital with complaints of chest pain.   Assessment & Plan  Principal Problem:   Unstable angina ([place])  To cath lab today for refractory chest pain while on IV nitro.  Echo without significant abnormality.    Elouise Munroe, MD

## 2019-11-16 NOTE — Progress Notes (Signed)
Progress Note  Patient Name: Benjamin Robertson Date of Encounter: 11/16/2019  Primary Cardiologist:  Glenetta Hew, MD  Subjective   8/10 chest and throat pain  Inpatient Medications    Scheduled Meds: . aspirin EC  81 mg Oral Daily  . metoprolol tartrate  25 mg Oral BID  . ramipril  10 mg Oral Daily   Continuous Infusions: . heparin 1,300 Units/hr (11/15/19 1115)  . nitroGLYCERIN 55 mcg/min (11/16/19 0738)   PRN Meds: acetaminophen, morphine injection, nitroGLYCERIN, ondansetron (ZOFRAN) IV   Vital Signs    Vitals:   11/16/19 0723 11/16/19 0737 11/16/19 0742 11/16/19 0743  BP: (!) 157/125 114/63 98/62   Pulse: 70 70 69 70  Resp:      Temp:      TempSrc:      SpO2: (!) 89% (!) 89% 91% 96%  Weight:      Height:        Intake/Output Summary (Last 24 hours) at 11/16/2019 0809 Last data filed at 11/16/2019 0514 Gross per 24 hour  Intake 1075.95 ml  Output 225 ml  Net 850.95 ml   Filed Weights   11/15/19 0106 11/15/19 0530 11/16/19 0300  Weight: 85.4 kg 85.1 kg 86.2 kg   Last Weight  Most recent update: 11/16/2019  3:04 AM   Weight  86.2 kg (190 lb)           Weight change: -2.268 kg   Telemetry    SR - Personally Reviewed  ECG    SR - Personally Reviewed  Physical Exam   GEN: chest pain   Neck: No JVD Cardiac: RRR, no murmurs, rubs, or gallops.  Respiratory: Clear to auscultation bilaterally. GI: Soft, nontender, non-distended  MS: No edema; No deformity. Neuro:  Nonfocal  Psych: Normal affect   Labs    Hematology Recent Labs  Lab 11/14/19 1913 11/15/19 0158  WBC 9.7 8.8  RBC 5.66 5.15  HGB 17.9* 15.8  HCT 53.3* 48.1  MCV 94.2 93.4  MCH 31.6 30.7  MCHC 33.6 32.8  RDW 13.4 13.2  PLT 253 221    Chemistry Recent Labs  Lab 11/14/19 1913 11/14/19 2040 11/15/19 0158  NA 139  --  139  K 4.5  --  4.2  CL 102  --  104  CO2 26  --  26  GLUCOSE 148*  --  123*  BUN 15  --  15  CREATININE 1.17  --  0.96  CALCIUM 9.8  --  9.3  PROT   --  6.9  --   ALBUMIN  --  4.0  --   AST  --  20  --   ALT  --  16  --   ALKPHOS  --  62  --   BILITOT  --  0.8  --   GFRNONAA >60  --  >60  GFRAA >60  --  >60  ANIONGAP 11  --  9     High Sensitivity Troponin:   Recent Labs  Lab 11/14/19 1913 11/14/19 2040 11/14/19 2249 11/15/19 0044 11/15/19 0739  TROPONINIHS 74* 81* 118* 177* 465*      BNPNo results for input(s): BNP, PROBNP in the last 168 hours.   DDimer No results for input(s): DDIMER in the last 168 hours.   Radiology    DG Chest 2 View  Result Date: 11/14/2019 CLINICAL DATA:  Chest pain EXAM: CHEST - 2 VIEW COMPARISON:  01/29/2015 FINDINGS: Lungs are clear. No pneumothorax or pleural effusion.  Coronary artery bypass grafting has been performed. Cardiac size within normal limits. The pulmonary vascularity is normal. Degenerative changes are seen within the thoracic spine. IMPRESSION: No acute process in the chest. Electronically Signed   By: Fidela Salisbury MD   On: 11/14/2019 19:33   ECHOCARDIOGRAM COMPLETE  Result Date: 11/15/2019    ECHOCARDIOGRAM REPORT   Patient Name:   Benjamin Robertson Date of Exam: 11/15/2019 Medical Rec #:  [id number]    Height:       68.0 in Accession #:    [id number]   Weight:       187.6 lb Date of Birth:  [birth year]     BSA:          1.989 m Patient Age:    69 years     BP:           92/60 mmHg Patient Gender: M            HR:           67 bpm. Exam Location:  Inpatient Procedure: 2D Echo, Cardiac Doppler and Color Doppler Indications:    NSTEMI I21.4  History:        Patient has prior history of Echocardiogram examinations, most                 recent 05/06/2012. Cardiomyopathy, CAD, Prior CABG, COPD; Risk                 Factors:Hypertension, Dyslipidemia and Current Smoker. ST                 elevation myocardial infarction (STEMI) of inferior wall (HCC)                 (From Hx).  Sonographer:    Alvino Chapel RCS Referring Phys: [id number] [place]  1. Left ventricular ejection  fraction, by estimation, is 55 to 60%. The left ventricle has normal function. Left ventricular endocardial border not optimally defined to evaluate regional wall motion, but appears grossly normal. Left ventricular diastolic parameters are consistent with Grade I diastolic dysfunction (impaired relaxation).  2. Right ventricular systolic function is mildly reduced. The right ventricular size is normal. Tricuspid regurgitation signal is inadequate for assessing PA pressure.  3. The mitral valve is normal in structure. Trivial mitral valve regurgitation. No evidence of mitral stenosis.  4. The aortic valve is normal in structure. Aortic valve regurgitation is trivial. No aortic stenosis is present.  5. The inferior vena cava is normal in size with greater than 50% respiratory variability, suggesting right atrial pressure of 3 mmHg. FINDINGS  Left Ventricle: Left ventricular ejection fraction, by estimation, is 55 to 60%. The left ventricle has normal function. Left ventricular endocardial border not optimally defined to evaluate regional wall motion. The left ventricular internal cavity size was normal in size. There is no left ventricular hypertrophy. Left ventricular diastolic parameters are consistent with Grade I diastolic dysfunction (impaired relaxation). Right Ventricle: The right ventricular size is normal. No increase in right ventricular wall thickness. Right ventricular systolic function is mildly reduced. Tricuspid regurgitation signal is inadequate for assessing PA pressure. Left Atrium: Left atrial size was normal in size. Right Atrium: Right atrial size was normal in size. Pericardium: There is no evidence of pericardial effusion. Mitral Valve: The mitral valve is normal in structure. Normal mobility of the mitral valve leaflets. Trivial mitral valve regurgitation. No evidence of mitral valve stenosis. Tricuspid Valve: The tricuspid valve is normal in  structure. Tricuspid valve regurgitation is  trivial. No evidence of tricuspid stenosis. Aortic Valve: The aortic valve is normal in structure. Aortic valve regurgitation is trivial. No aortic stenosis is present. Pulmonic Valve: The pulmonic valve was normal in structure. Pulmonic valve regurgitation is mild to moderate. No evidence of pulmonic stenosis. Aorta: The aortic root is normal in size and structure. Venous: The inferior vena cava is normal in size with greater than 50% respiratory variability, suggesting right atrial pressure of 3 mmHg. IAS/Shunts: No atrial level shunt detected by color flow Doppler.  LEFT VENTRICLE PLAX 2D LVIDd:         4.80 cm  Diastology LVIDs:         3.00 cm  LV e' lateral:   8.59 cm/s LV PW:         1.00 cm  LV E/e' lateral: 4.6 LV IVS:        0.90 cm  LV e' medial:    7.07 cm/s LVOT diam:     2.10 cm  LV E/e' medial:  5.5 LV SV:         55 LV SV Index:   28 LVOT Area:     3.46 cm  RIGHT VENTRICLE RV S prime:     6.32 cm/s TAPSE (M-mode): 1.8 cm LEFT ATRIUM             Index       RIGHT ATRIUM           Index LA diam:        4.10 cm 2.06 cm/m  RA Area:     17.70 cm LA Vol (A2C):   39.8 ml 20.01 ml/m RA Volume:   51.10 ml  25.69 ml/m LA Vol (A4C):   45.8 ml 23.03 ml/m LA Biplane Vol: 44.8 ml 22.52 ml/m  AORTIC VALVE LVOT Vmax:   92.40 cm/s LVOT Vmean:  53.000 cm/s LVOT VTI:    0.159 m  AORTA Ao Root diam: 3.50 cm MITRAL VALVE MV Area (PHT): 3.39 cm    SHUNTS MV Decel Time: 224 msec    Systemic VTI:  0.16 m MV E velocity: 39.20 cm/s  Systemic Diam: 2.10 cm Cherlynn Kaiser MD Electronically signed by Cherlynn Kaiser MD Signature Date/Time: 11/15/2019/5:36:23 PM    Final      Cardiac Studies   Echo 9/4 1. Left ventricular ejection fraction, by estimation, is 55 to 60%. The  left ventricle has normal function. Left ventricular endocardial border  not optimally defined to evaluate regional wall motion, but appears  grossly normal. Left ventricular  diastolic parameters are consistent with Grade I diastolic  dysfunction  (impaired relaxation).  2. Right ventricular systolic function is mildly reduced. The right  ventricular size is normal. Tricuspid regurgitation signal is inadequate  for assessing PA pressure.  3. The mitral valve is normal in structure. Trivial mitral valve  regurgitation. No evidence of mitral stenosis.  4. The aortic valve is normal in structure. Aortic valve regurgitation is  trivial. No aortic stenosis is present.  5. The inferior vena cava is normal in size with greater than 50%  respiratory variability, suggesting right atrial pressure of 3 mmHg.   Patient Profile     69 y.o. male w/ hx of CAD (s/p CABG with LIMA-LAD, SVG-Diag, SVG-RI and also PCI's to RCA & ostial LCx), ischemic cardiomyopathy, hypertension, hyperlipidemia, arthritis and tobacco use, who was admitted to the hospital with complaints of chest pain.   Assessment & Plan  Principal Problem:   Unstable angina ([place])  To cath lab today for refractory chest pain while on IV nitro.  Echo without significant abnormality.    Elouise Munroe, MD

## 2019-11-16 NOTE — Progress Notes (Signed)
This note was prepared by Kathryne Hitch For questions contact my phone at [phone].   11/16/19 0800  Clinical Encounter Type  Visited With Patient  Visit Type Spiritual support  Referral From Nurse  Consult/Referral To Chaplain  Spiritual Encounters  Spiritual Needs Emotional  Stress Factors  Patient Stress Factors Health changes  Family Stress Factors None identified  Chaplain responded to page. Patient being transferred to [facility]. Chaplain will follow up.

## 2019-11-16 NOTE — Progress Notes (Signed)
ANTICOAGULATION CONSULT NOTE - Initial Consult  Pharmacy Consult for Heparin & Bivalirudin Indication: chest pain/ACS  Allergies  Allergen Reactions  . Brilinta [Ticagrelor] Shortness Of Breath    Having breathing issues  . Advicor [Niacin-Lovastatin Er] Other (See Comments)    Headache, possibly other reactions  . Lipitor [Atorvastatin] Other (See Comments)    Headache, possibly other reactions  . Pravachol [Pravastatin Sodium] Other (See Comments)    Headache, possibly other reactions  . Rosuvastatin Other (See Comments)     40 MG  TABLET--EXTREME FATIGUE  , MUSCLE  PAIN     Patient Measurements: Height: 5\' 8"  (172.7 cm) Weight: 86.2 kg (190 lb) IBW/kg (Calculated) : 68.4 Heparin Dosing Weight: 85.4 kg  Vital Signs: Temp: 97.7 F (36.5 C) (09/05 0300) Temp Source: Oral (09/05 0300) BP: 155/78 (09/05 0953) Pulse Rate: 0 (09/05 0953)  Labs: Recent Labs    11/14/19 1913 11/14/19 2040 11/14/19 2249 11/15/19 0044 11/15/19 0158 11/15/19 0739  HGB 17.9*  --   --   --  15.8  --   HCT 53.3*  --   --   --  48.1  --   PLT 253  --   --   --  221  --   LABPROT  --   --   --   --  12.7  --   INR  --   --   --   --  1.0  --   HEPARINUNFRC  --   --   --   --  0.46 0.40  CREATININE 1.17  --   --   --  0.96  --   TROPONINIHS 74*   < > 118* 177*  --  465*   < > = values in this interval not displayed.    Estimated Creatinine Clearance: 78.6 mL/min (by C-G formula based on SCr of 0.96 mg/dL).   Medical History: Past Medical History:  Diagnosis Date  . Arthritis    intermittent  . CAD in native artery 1994, 2001   a) 2001 Exertional Angina --> Myoview: Inf infarct and Ant ischemia --> Cath: 95% pLAD, 99% D1, 80% RI, but Cx-OM1&2 OK , & patent RCA stent --> CABG: b) Cath in 2003 patent grafts, RCA & Cx; c) 08/2009 Myoview: Mod Basal-Mid inferoseptal, Basal-apical inferior Infarct. No ischemia.; d) May 2016: PCI to Ostial Cx (not bypassed) - Xience DES 3.0 x 15 (3.6 mm)  .  Childhood asthma   . Hemorrhoids    history of  . History of ST elevation myocardial infarction (STEMI) of inferior wall 1994; 1997   PCI of RCA - redo PCI BMS ML Vision 3.0 mm x 25 mm to RCA in 1997  . Hyperlipidemia LDL goal <70    At goal by Most recent labs October 2013: TC 123, TG 83, HDL 81, LDL 65  . Hypertension   . Ischemic cardiomyopathy 1994, 2003   Echo February 2014: EF 40-45%; moderate HK of anterolateral myocardium.; Grade 1 diastolic dysfunction  . Kidney stones    3 times had lithrotripsy  . Obesity (BMI 30.0-34.9)   . Pneumonia 2014  . S/P CABG x 3 2001   LIMA-LAD, SVG-diagonal, SVG-Ramus Intermedius (RI) - patent by Cath in 2003    Assessment: 69 yo M presents with NSTEMI and now s/p PCI. Bivalirudin was started in cath. Pt was on heparin gtt prior to PCI which was switched to bivalirudin while in cath. Pharmacy asked to continue bivalirudin for 4 hours post-cath  and to start heparin 8 hours post-sheath removal. Sheath was removed at 1000 this AM.   Patient was therapeutic on heparin drip rate 1300 units/hr yesterday morning. Labs were not drawn this morning. No overt bleeding noted. Will continue current bivalirudin and restart heparin at previous infusion rate.  Goal of Therapy:  Heparin level 0.3-0.7 units/ml Monitor platelets by anticoagulation protocol: Yes   Plan:  Continue bivalirudin 1.75 mg/kg/hr until 1400 or until bag runs out Start heparin infusion at 1300 units/hr at 1800 Check 6-hr HL at midnight Monitor daily HL, CBC, s/sx bleeding  Richardine Service, PharmD PGY2 Cardiology Pharmacy Resident Phone: [phone] 11/16/2019  11:02 AM  Please check [email or web address] for unit-specific pharmacy phone numbers.

## 2019-11-16 NOTE — Consult Note (Signed)
F/U with pt, referral from night chaplain from time he was transferred from [place] to [facility]. Visited with pt and his wife bedside. They've lived in [place] for almost 30 yrs. Pt said he's feeling better now, and his wife said several times, The Lord's working. She said initially the Cath Lab was closed, and pt would have been upstairs till Tuesday, but they were able to do some procedures ultimately in the Cath Lab, and pt is better off now where he is in [place] until Tuesday.   Pt and his wife have a strong faith. She said all day people at their church were constantly praying for pt. She did not directly decline my offer of prayer, but seemed to feel itt would be iunnecessary as pt was already covered by the prayers of their church members. I let h er know where [facility] and the chapel were, and that they could ask a nurse to call a chaplain at any time if they wished.  Rev. Eloise Levels Chaplain

## 2019-11-16 NOTE — Progress Notes (Signed)
   Cardiac catheterization was discussed with the patient fully. The patient understands that risks include but are not limited to stroke (1 in 1000), death (1 in 50), kidney failure [usually temporary] (1 in 500), bleeding (1 in 200), allergic reaction [possibly serious] (1 in 200).  The patient understands and is willing to proceed.     Rosaria Ferries, PA-C 11/16/2019 8:33 AM

## 2019-11-16 NOTE — Progress Notes (Signed)
Pt c/o chest pain from chest to neck/jaw 9/10. Increased nitro and did EKG.  Idolina Primer, RN

## 2019-11-16 NOTE — Interval H&P Note (Signed)
Cath Lab Visit (complete for each Cath Lab visit)  Clinical Evaluation Leading to the Procedure:   ACS: Yes.    Non-ACS:    Anginal Classification: CCS IV  Anti-ischemic medical therapy: Maximal Therapy (2 or more classes of medications)  Non-Invasive Test Results: No non-invasive testing performed  Prior CABG: Previous CABG      History and Physical Interval Note:  11/16/2019 8:28 AM  Benjamin Robertson  has presented today for surgery, with the diagnosis of STEMI.  The various methods of treatment have been discussed with the patient and family. After consideration of risks, benefits and other options for treatment, the patient has consented to  Procedure(s): Coronary/Graft Acute MI Revascularization (N/A) LEFT HEART CATH AND CORONARY ANGIOGRAPHY (N/A) as a surgical intervention.  The patient's history has been reviewed, patient examined, no change in status, stable for surgery.  I have reviewed the patient's chart and labs.  Questions were answered to the patient's satisfaction.     Benjamin Robertson

## 2019-11-16 NOTE — Progress Notes (Signed)
° °  Spoke w/ wife by phone.  Updated her on events of this am and preliminary cath report.   Rosaria Ferries, PA-C 11/16/2019 9:28 AM

## 2019-11-17 LAB — CBC
HCT: 42.3 % (ref 39.0–52.0)
Hemoglobin: 13.8 g/dL (ref 13.0–17.0)
MCH: 30.9 pg (ref 26.0–34.0)
MCHC: 32.6 g/dL (ref 30.0–36.0)
MCV: 94.6 fL (ref 80.0–100.0)
Platelets: 185 10*3/uL (ref 150–400)
RBC: 4.47 MIL/uL (ref 4.22–5.81)
RDW: 13.2 % (ref 11.5–15.5)
WBC: 7.7 10*3/uL (ref 4.0–10.5)
nRBC: 0 % (ref 0.0–0.2)

## 2019-11-17 LAB — BASIC METABOLIC PANEL
Anion gap: 7 (ref 5–15)
BUN: 13 mg/dL (ref 8–23)
CO2: 26 mmol/L (ref 22–32)
Calcium: 9 mg/dL (ref 8.9–10.3)
Chloride: 107 mmol/L (ref 98–111)
Creatinine, Ser: 1.04 mg/dL (ref 0.61–1.24)
GFR calc Af Amer: >60 mL/min (ref >60)
GFR calc non Af Amer: >60 mL/min (ref >60)
Glucose, Bld: 108 mg/dL — ABNORMAL HIGH (ref 70–99)
Potassium: 4.1 mmol/L (ref 3.5–5.1)
Sodium: 140 mmol/L (ref 135–145)

## 2019-11-17 LAB — HEPARIN LEVEL (UNFRACTIONATED)
Heparin Unfractionated: 0.27 IU/mL — ABNORMAL LOW (ref 0.30–0.70)
Heparin Unfractionated: 0.41 IU/mL (ref 0.30–0.70)

## 2019-11-17 MED ORDER — SODIUM CHLORIDE 0.9% FLUSH
3.0000 mL | Freq: Two times a day (BID) | INTRAVENOUS | Status: DC
  Administered 2019-11-17 – 2019-11-18 (×2): 3 mL via INTRAVENOUS

## 2019-11-17 MED ORDER — SODIUM CHLORIDE 0.9 % IV SOLN: 250.0000 mL | INTRAVENOUS | Status: DC | PRN

## 2019-11-17 MED ORDER — SODIUM CHLORIDE 0.9 % WEIGHT BASED INFUSION: 1.0000 mL/kg/h | INTRAVENOUS | Status: DC

## 2019-11-17 MED ORDER — SODIUM CHLORIDE 0.9% FLUSH: 3.0000 mL | INTRAVENOUS | Status: DC | PRN

## 2019-11-17 MED ORDER — SODIUM CHLORIDE 0.9 % WEIGHT BASED INFUSION
3.0000 mL/kg/h | INTRAVENOUS | Status: DC
  Administered 2019-11-18: 3 mL/kg/h via INTRAVENOUS

## 2019-11-17 MED ORDER — ASPIRIN 81 MG PO CHEW
81.0000 mg | CHEWABLE_TABLET | ORAL | Status: AC
  Administered 2019-11-18: 81 mg via ORAL
  Filled 2019-11-17: qty 1

## 2019-11-17 NOTE — Progress Notes (Signed)
[place] for Heparin  Indication: chest pain/ACS  Allergies  Allergen Reactions  . Brilinta [Ticagrelor] Shortness Of Breath    Having breathing issues  . Advicor [Niacin-Lovastatin Er] Other (See Comments)    Headache, possibly other reactions  . Lipitor [Atorvastatin] Other (See Comments)    Headache, possibly other reactions  . Pravachol [Pravastatin Sodium] Other (See Comments)    Headache, possibly other reactions  . Rosuvastatin Other (See Comments)     40 MG  TABLET--EXTREME FATIGUE  , MUSCLE  PAIN     Patient Measurements: Height: 5\' 8"  (172.7 cm) Weight: 86 kg (189 lb 9.5 oz) IBW/kg (Calculated) : 68.4 Heparin Dosing Weight: 85.4 kg  Vital Signs: Temp: 98.4 F (36.9 C) (09/06 1113) Temp Source: Oral (09/06 0719) BP: 104/69 (09/06 1030) Pulse Rate: 74 (09/06 1030)  Labs: Recent Labs    11/14/19 1913 11/14/19 2040 11/15/19 0158 11/15/19 0158 11/15/19 0739 11/16/19 1128 11/16/19 1401 11/17/19 0039 11/17/19 0748  HGB 17.9*   < > 15.8   < >  --  14.7  --  13.8  --   HCT 53.3*   < > 48.1  --   --  44.5  --  42.3  --   PLT 253   < > 221  --   --  197  --  185  --   LABPROT  --   --  12.7  --   --   --   --   --   --   INR  --   --  1.0  --   --   --   --   --   --   HEPARINUNFRC  --   --  0.46   < > 0.40  --   --  0.27* 0.41  CREATININE 1.17  --  0.96  --   --   --   --  1.04  --   TROPONINIHS 74*   < >  --    < > 465* 506* 650*  --   --    < > = values in this interval not displayed.    Estimated Creatinine Clearance: 72.5 mL/min (by C-G formula based on SCr of 1.04 mg/dL).   Assessment: 69 yo M presents with NSTEMI and now s/p PCI. Bivalirudin was started in cath. Pt was on heparin gtt prior to PCI which was switched to bivalirudin while in cath. Pharmacy asked to continue bivalirudin for 4 hours post-cath and to restart heparin 8 hours post-sheath removal. Sheath was removed at 1000 9/6.   Heparin level now at  goal on 1400 units/hr. No issues with line or bleeding reported per RN.  No overt bleeding or complications noted.  Awaiting second part of staged PCI tomorrow.  Goal of Therapy:  Heparin level 0.3-0.7 units/ml Monitor platelets by anticoagulation protocol: Yes   Plan:  Continue IV heparin at current rate. Daily heparin level and CBC. F/u plans for anticoagulation after cath tomorrow.  [place] Crane, Roylene Reason, BCCP Clinical Pharmacist  11/17/2019 1:36 PM   [facility] pharmacy phone numbers are listed on [email or web address]

## 2019-11-17 NOTE — Progress Notes (Signed)
Wife done bath

## 2019-11-17 NOTE — Progress Notes (Signed)
Progress Note  Patient Name: Benjamin Robertson Date of Encounter: 11/17/2019  Primary Cardiologist: Ellyn Hack  Subjective   No recurrent anginal chest pain  Inpatient Medications    Scheduled Meds: . arformoterol  15 mcg Nebulization BID   And  . umeclidinium bromide  1 puff Inhalation Daily  . aspirin  81 mg Oral Daily  . Chlorhexidine Gluconate Cloth  6 each Topical Daily  . clopidogrel  75 mg Oral Q breakfast  . metoprolol tartrate  25 mg Oral BID  . ramipril  10 mg Oral Daily  . sodium chloride flush  3 mL Intravenous Q12H   Continuous Infusions: . sodium chloride Stopped (11/17/19 0107)  . sodium chloride Stopped (11/16/19 1736)  . heparin 1,400 Units/hr (11/17/19 0600)  . nitroGLYCERIN 40 mcg/min (11/17/19 0600)   PRN Meds: sodium chloride, acetaminophen, albuterol, ALPRAZolam, morphine injection, nitroGLYCERIN, ondansetron (ZOFRAN) IV, sodium chloride flush   Vital Signs    Vitals:   11/17/19 0600 11/17/19 0637 11/17/19 0719 11/17/19 0735  BP:  (!) 136/110    Pulse: (!) 59 68    Resp: 13 (!) 24    Temp:   97.6 F (36.4 C)   TempSrc:      SpO2: 90% 94%  96%  Weight:      Height:        Intake/Output Summary (Last 24 hours) at 11/17/2019 0853 Last data filed at 11/17/2019 0600 Gross per 24 hour  Intake 2229.56 ml  Output 2000 ml  Net 229.56 ml    I/O since admission:   Filed Weights   11/15/19 0530 11/16/19 0300 11/16/19 1128  Weight: 85.1 kg 86.2 kg 86 kg    Telemetry    Sinus in the 70s - personally Reviewed  ECG    ECG (independently read by me): NSR 67, q III, V1-3  Physical Exam    BP (!) 136/110   Pulse 68   Temp 97.6 F (36.4 C)   Resp (!) 24   Ht 5\' 8"  (1.727 m)   Wt 86 kg   SpO2 96%   BMI 28.83 kg/m    Blood pressure currently 114/74  General: Alert, oriented, no distress.  Skin: normal turgor, no rashes, warm and dry HEENT: Normocephalic, atraumatic. Pupils equal round and reactive to light; sclera anicteric; extraocular  muscles intact; Nose without nasal septal hypertrophy Mouth/Parynx benign; Mallinpatti scale 3 Neck: No JVD, no carotid bruits; normal carotid upstroke Lungs: clear to ausculatation and percussion; no wheezing or rales Chest wall: Mild left costochondral chest wall tenderness Heart: PMI not displaced, RRR, s1 s2 normal, 1/6 systolic murmur, no diastolic murmur, no rubs, gallops, thrills, or heaves Abdomen: soft, nontender; no hepatosplenomehaly, BS+; abdominal aorta nontender and not dilated by palpation. Back: no CVA tenderness Pulses 2+; right groin cath site stable Musculoskeletal: full range of motion, normal strength, no joint deformities Extremities: no clubbing cyanosis or edema, Homan's sign negative  Neurologic: grossly nonfocal; Cranial nerves grossly wnl Psychologic: Normal mood and affect  Labs    Chemistry Recent Labs  Lab 11/14/19 1913 11/14/19 2040 11/15/19 0158 11/17/19 0039  NA 139  --  139 140  K 4.5  --  4.2 4.1  CL 102  --  104 107  CO2 26  --  26 26  GLUCOSE 148*  --  123* 108*  BUN 15  --  15 13  CREATININE 1.17  --  0.96 1.04  CALCIUM 9.8  --  9.3 9.0  PROT  --  6.9  --   --   ALBUMIN  --  4.0  --   --   AST  --  20  --   --   ALT  --  16  --   --   ALKPHOS  --  62  --   --   BILITOT  --  0.8  --   --   GFRNONAA >60  --  >60 >60  GFRAA >60  --  >60 >60  ANIONGAP 11  --  9 7     Hematology Recent Labs  Lab 11/15/19 0158 11/16/19 1128 11/17/19 0039  WBC 8.8 8.2 7.7  RBC 5.15 4.74 4.47  HGB 15.8 14.7 13.8  HCT 48.1 44.5 42.3  MCV 93.4 93.9 94.6  MCH 30.7 31.0 30.9  MCHC 32.8 33.0 32.6  RDW 13.2 13.2 13.2  PLT 221 197 185    HS Trop: 81 > 118 > 177 > 465> 506 > 650  Cardiac EnzymesNo results for input(s): TROPONINI in the last 168 hours. No results for input(s): TROPIPOC in the last 168 hours.   BNPNo results for input(s): BNP, PROBNP in the last 168 hours.   DDimer No results for input(s): DDIMER in the last 168 hours.   Lipid  Panel     Component Value Date/Time   CHOL 127 11/15/2019 0158   CHOL 232 (H) 06/17/2019 0813   TRIG 123 11/15/2019 0158   HDL 41 11/15/2019 0158   HDL 32 (L) 06/17/2019 0813   CHOLHDL 3.1 11/15/2019 0158   VLDL 25 11/15/2019 0158   LDLCALC 61 11/15/2019 0158   LDLCALC 152 (H) 06/17/2019 0813   LDLDIRECT 154.0 10/06/2019 1209     Radiology    CARDIAC CATHETERIZATION  Result Date: 11/16/2019  Prox RCA to Mid RCA lesion is 100% stenosed.  Lat 1st Mrg lesion is 90% stenosed.  Ost Cx lesion is 90% stenosed.  Ramus lesion is 80% stenosed.  Ost LAD to Prox LAD lesion is 50% stenosed.  Post intervention, there is a 0% residual stenosis.  A stent was successfully placed.  Acute coronary syndrome secondary to total occlusion of the RCA at site of prior stent with initial TIMI 0 flow. Left main is a normal vessel which gives rise to the LAD,  ramus intermediate and large codominant circumflex vessel.  The LAD had 50% ostial narrowing followed by 30% narrowing after the first diagonal vessel.  There was competitive filling to the mid LAD due to LIMA flow. The ramus intermediate vessel was small caliber diffusely diseased proximally. The left circumflex vessel had 95% ostial stenosis immediately prior to and including the proximal portion of the previously placed ostial circumflex stent.  The OM1 vessel had a small branch bifurcation with 90% stenosis.  The AV groove and distal circumflex vessel was angiographically normal. Patent LIMA graft supplying the mid LAD. Patent SVG graft supplying the diagonal vessel. Patent SVG supplying the ramus immediate vessel without filling retrograde to the ostium. LVEDP 17 mmHg. Successful PCI to the totally occluded native RCA with ultimate insertion of a 2.5 x 30 mm straight Resolute Onyx stent postdilated to 2.75 mm with the 1% occlusion being reduced to 0% with resumption of brisk TIMI-3 flow supplying a moderate size caliber distal RCA and PDA vessel.  RECOMMENDATION: DAPT for minimum of 1 year or indefinitely.  The patient will continue with bivalirudin for 4 hours post intervention.  His sheath will be pulled and heparin will be reinstituted 8 hours post  sheath pull.  The patient will continue to be on intravenous nitroglycerin, currently at 50 mcg until his planned staged PCI to his ostial 95% circumflex vessel.  The patient has statin intolerance and is on PCSK9 inhibition with Repatha.  Smoking cessation is essential.   ECHOCARDIOGRAM COMPLETE  Result Date: 11/15/2019    ECHOCARDIOGRAM REPORT   Patient Name:   Benjamin Robertson Date of Exam: 11/15/2019 Medical Rec #:  [id number]    Height:       68.0 in Accession #:    [id number]   Weight:       187.6 lb Date of Birth:  July 01, 1950     BSA:          1.989 m Patient Age:    69 years     BP:           92/60 mmHg Patient Gender: M            HR:           67 bpm. Exam Location:  Inpatient Procedure: 2D Echo, Cardiac Doppler and Color Doppler Indications:    NSTEMI I21.4  History:        Patient has prior history of Echocardiogram examinations, most                 recent 05/06/2012. Cardiomyopathy, CAD, Prior CABG, COPD; Risk                 Factors:Hypertension, Dyslipidemia and Current Smoker. ST                 elevation myocardial infarction (STEMI) of inferior wall (HCC)                 (From Hx).  Sonographer:    Alvino Chapel RCS Referring Phys: [id number] [place]  1. Left ventricular ejection fraction, by estimation, is 55 to 60%. The left ventricle has normal function. Left ventricular endocardial border not optimally defined to evaluate regional wall motion, but appears grossly normal. Left ventricular diastolic parameters are consistent with Grade I diastolic dysfunction (impaired relaxation).  2. Right ventricular systolic function is mildly reduced. The right ventricular size is normal. Tricuspid regurgitation signal is inadequate for assessing PA pressure.  3. The mitral valve is normal  in structure. Trivial mitral valve regurgitation. No evidence of mitral stenosis.  4. The aortic valve is normal in structure. Aortic valve regurgitation is trivial. No aortic stenosis is present.  5. The inferior vena cava is normal in size with greater than 50% respiratory variability, suggesting right atrial pressure of 3 mmHg. FINDINGS  Left Ventricle: Left ventricular ejection fraction, by estimation, is 55 to 60%. The left ventricle has normal function. Left ventricular endocardial border not optimally defined to evaluate regional wall motion. The left ventricular internal cavity size was normal in size. There is no left ventricular hypertrophy. Left ventricular diastolic parameters are consistent with Grade I diastolic dysfunction (impaired relaxation). Right Ventricle: The right ventricular size is normal. No increase in right ventricular wall thickness. Right ventricular systolic function is mildly reduced. Tricuspid regurgitation signal is inadequate for assessing PA pressure. Left Atrium: Left atrial size was normal in size. Right Atrium: Right atrial size was normal in size. Pericardium: There is no evidence of pericardial effusion. Mitral Valve: The mitral valve is normal in structure. Normal mobility of the mitral valve leaflets. Trivial mitral valve regurgitation. No evidence of mitral valve stenosis. Tricuspid Valve: The tricuspid valve is normal in structure.  Tricuspid valve regurgitation is trivial. No evidence of tricuspid stenosis. Aortic Valve: The aortic valve is normal in structure. Aortic valve regurgitation is trivial. No aortic stenosis is present. Pulmonic Valve: The pulmonic valve was normal in structure. Pulmonic valve regurgitation is mild to moderate. No evidence of pulmonic stenosis. Aorta: The aortic root is normal in size and structure. Venous: The inferior vena cava is normal in size with greater than 50% respiratory variability, suggesting right atrial pressure of 3 mmHg.  IAS/Shunts: No atrial level shunt detected by color flow Doppler.  LEFT VENTRICLE PLAX 2D LVIDd:         4.80 cm  Diastology LVIDs:         3.00 cm  LV e' lateral:   8.59 cm/s LV PW:         1.00 cm  LV E/e' lateral: 4.6 LV IVS:        0.90 cm  LV e' medial:    7.07 cm/s LVOT diam:     2.10 cm  LV E/e' medial:  5.5 LV SV:         55 LV SV Index:   28 LVOT Area:     3.46 cm  RIGHT VENTRICLE RV S prime:     6.32 cm/s TAPSE (M-mode): 1.8 cm LEFT ATRIUM             Index       RIGHT ATRIUM           Index LA diam:        4.10 cm 2.06 cm/m  RA Area:     17.70 cm LA Vol (A2C):   39.8 ml 20.01 ml/m RA Volume:   51.10 ml  25.69 ml/m LA Vol (A4C):   45.8 ml 23.03 ml/m LA Biplane Vol: 44.8 ml 22.52 ml/m  AORTIC VALVE LVOT Vmax:   92.40 cm/s LVOT Vmean:  53.000 cm/s LVOT VTI:    0.159 m  AORTA Ao Root diam: 3.50 cm MITRAL VALVE MV Area (PHT): 3.39 cm    SHUNTS MV Decel Time: 224 msec    Systemic VTI:  0.16 m MV E velocity: 39.20 cm/s  Systemic Diam: 2.10 cm Cherlynn Kaiser MD Electronically signed by Cherlynn Kaiser MD Signature Date/Time: 11/15/2019/5:36:23 PM    Final     Cardiac Studies    Prox RCA to Mid RCA lesion is 100% stenosed.  Lat 1st Mrg lesion is 90% stenosed.  Ost Cx lesion is 90% stenosed.  Ramus lesion is 80% stenosed.  Ost LAD to Prox LAD lesion is 50% stenosed.  Post intervention, there is a 0% residual stenosis.  A stent was successfully placed.   Acute coronary syndrome secondary to total occlusion of the RCA at site of prior stent with initial TIMI 0 flow.  Left main is a normal vessel which gives rise to the LAD,  ramus intermediate and large codominant circumflex vessel.  The LAD had 50% ostial narrowing followed by 30% narrowing after the first diagonal vessel.  There was competitive filling to the mid LAD due to LIMA flow.  The ramus intermediate vessel was small caliber diffusely diseased proximally.  The left circumflex vessel had 95% ostial stenosis immediately  prior to and including the proximal portion of the previously placed ostial circumflex stent.  The OM1 vessel had a small branch bifurcation with 90% stenosis.  The AV groove and distal circumflex vessel was angiographically normal.  Patent LIMA graft supplying the mid LAD.  Patent SVG graft supplying the  diagonal vessel.  Patent SVG supplying the ramus immediate vessel without filling retrograde to the ostium.  LVEDP 17 mmHg.  Successful PCI to the totally occluded native RCA with ultimate insertion of a 2.5 x 30 mm straight Resolute Onyx stent postdilated to 2.75 mm with the 1% occlusion being reduced to 0% with resumption of brisk TIMI-3 flow supplying a moderate size caliber distal RCA and PDA vessel.  RECOMMENDATION: DAPT for minimum of 1 year or indefinitely.  The patient will continue with bivalirudin for 4 hours post intervention.  His sheath will be pulled and heparin will be reinstituted 8 hours post sheath pull.  The patient will continue to be on intravenous nitroglycerin, currently at 50 mcg until his planned staged PCI to his ostial 95% circumflex vessel.  The patient has statin intolerance and is on PCSK9 inhibition with Repatha.  Smoking cessation is essential.   Intervention     ECHO IMPRESSIONS  1. Left ventricular ejection fraction, by estimation, is 55 to 60%. The  left ventricle has normal function. Left ventricular endocardial border  not optimally defined to evaluate regional wall motion, but appears  grossly normal. Left ventricular  diastolic parameters are consistent with Grade I diastolic dysfunction  (impaired relaxation).  2. Right ventricular systolic function is mildly reduced. The right  ventricular size is normal. Tricuspid regurgitation signal is inadequate  for assessing PA pressure.  3. The mitral valve is normal in structure. Trivial mitral valve  regurgitation. No evidence of mitral stenosis.  4. The aortic valve is normal in  structure. Aortic valve regurgitation is  trivial. No aortic stenosis is present.  5. The inferior vena cava is normal in size with greater than 50%  respiratory variability, suggesting right atrial pressure of 3 mmHg.   Patient Profile     69 y.o. male who has known CAD with prior stenting to his RCA, CABG revascularization surgery in 2001 with a LIMA to LAD, SVG to diagonal and SVG to ramus intermediate and history of stenting to the ostium of his left circumflex vessel (unbypassed).  He presented to the hospital with increasing chest pain consistent with unstable angina.  He continues to have recurrent chest pain despite IV nitroglycerin and IV heparin and was taken acutely to the catheterization laboratory on November 16, 2019 for emergent cardiac catheterization.  Assessment & Plan    1.  Acute coronary syndrome/NSTEMI: Patient underwent emergent catheterization yesterday morning which revealed total occlusion of the native RCA at prior stented site in addition to 90% stenosis at the ostium of the circumflex immediately after the left main takeoff at site of prior stenting.  All grafts were patent.  He underwent successful PCI to the totally occluded RCA with successful stenting.  He has been maintained on IV heparin and IV nitroglycerin with plans for repeat catheterization tomorrow and PCI to the left circumflex ostium.  He has been without recurrent anginal symptoms.  He does have some mild chest soreness.  In the past the patient did not tolerate Brilinta secondary to dyspnea.  He is now on aspirin/Plavix.  2.  Hyperlipidemia: LDL cholesterol 61, improved from 152 in April 2021.  History of statin intolerance.  On Repatha prior to admission.  3. Tobacco abuse: Discussed the necessity of tobacco cessation.  4.  Essential hypertension: Blood pressure currently is stable at 114/74.  Plan is to continue IV nitroglycerin today and not discontinue prior to his planned ostial circumflex  intervention tomorrow.  Renal function stable on ramipril 10  mg.   Plan for staged PCI to the left circumflex ostium tomorrow.  Patient aware of risk benefits of the procedure.  I discussed the patient's case with Dr. Ellyn Hack, his primary cardiologist.  Signed, Troy Sine, MD, [facility] 11/17/2019, 8:53 AM

## 2019-11-17 NOTE — Progress Notes (Signed)
[facility] for Heparin  Indication: chest pain/ACS  Allergies  Allergen Reactions  . Brilinta [Ticagrelor] Shortness Of Breath    Having breathing issues  . Advicor [Niacin-Lovastatin Er] Other (See Comments)    Headache, possibly other reactions  . Lipitor [Atorvastatin] Other (See Comments)    Headache, possibly other reactions  . Pravachol [Pravastatin Sodium] Other (See Comments)    Headache, possibly other reactions  . Rosuvastatin Other (See Comments)     40 MG  TABLET--EXTREME FATIGUE  , MUSCLE  PAIN     Patient Measurements: Height: 5\' 8"  (172.7 cm) Weight: 86 kg (189 lb 9.5 oz) IBW/kg (Calculated) : 68.4 Heparin Dosing Weight: 85.4 kg  Vital Signs: Temp: 97.8 F (36.6 C) (09/05 2339) Temp Source: Oral (09/05 2339) BP: 137/75 (09/06 0104) Pulse Rate: 64 (09/06 0104)  Labs: Recent Labs    11/14/19 1913 11/14/19 2040 11/15/19 0044 11/15/19 0158 11/15/19 0158 11/15/19 0739 11/16/19 1128 11/16/19 1401 11/17/19 0039  HGB 17.9*   < >  --  15.8   < >  --  14.7  --  13.8  HCT 53.3*   < >  --  48.1  --   --  44.5  --  42.3  PLT 253   < >  --  221  --   --  197  --  185  LABPROT  --   --   --  12.7  --   --   --   --   --   INR  --   --   --  1.0  --   --   --   --   --   HEPARINUNFRC  --   --   --  0.46  --  0.40  --   --  0.27*  CREATININE 1.17  --   --  0.96  --   --   --   --   --   TROPONINIHS 74*   < >   < >  --   --  465* 506* 650*  --    < > = values in this interval not displayed.    Estimated Creatinine Clearance: 78.5 mL/min (by C-G formula based on SCr of 0.96 mg/dL).   Assessment: 69 yo M presents with NSTEMI and now s/p PCI. Bivalirudin was started in cath. Pt was on heparin gtt prior to PCI which was switched to bivalirudin while in cath. Pharmacy asked to continue bivalirudin for 4 hours post-cath and to restart heparin 8 hours post-sheath removal. Sheath was removed at 1000 9/6.   Heparin level slightly  subtherapeutic (0.27) on gtt at 1300 units/hr. No issues with line or bleeding reported per RN.  Goal of Therapy:  Heparin level 0.3-0.7 units/ml Monitor platelets by anticoagulation protocol: Yes   Plan:  Increase heparin infusion to 1400 units/hr F/u 6 hr heparin level  Sherlon Handing, PharmD, BCPS Please see amion for complete clinical pharmacist phone list 11/17/2019  1:19 AM

## 2019-11-18 ENCOUNTER — Encounter (HOSPITAL_COMMUNITY)
Admission: EM | Disposition: A | Discharge status: Home / Self Care | Payer: Self-pay | Source: Home / Self Care | Attending: Internal Medicine

## 2019-11-18 ENCOUNTER — Encounter (HOSPITAL_COMMUNITY): Payer: Self-pay | Admitting: Cardiovascular Disease

## 2019-11-18 ENCOUNTER — Other Ambulatory Visit: Payer: Self-pay

## 2019-11-18 DIAGNOSIS — E785 Hyperlipidemia, unspecified: Secondary | ICD-10-CM

## 2019-11-18 DIAGNOSIS — I10 Essential (primary) hypertension: Secondary | ICD-10-CM

## 2019-11-18 DIAGNOSIS — I249 Acute ischemic heart disease, unspecified: Secondary | ICD-10-CM

## 2019-11-18 HISTORY — PX: CORONARY STENT INTERVENTION: CATH118234

## 2019-11-18 HISTORY — PX: CORONARY ANGIOGRAPHY: CATH118303

## 2019-11-18 LAB — POCT ACTIVATED CLOTTING TIME
Activated Clotting Time: 378 seconds
Activated Clotting Time: 324 seconds
Activated Clotting Time: 158 seconds

## 2019-11-18 LAB — CBC
HCT: 42.8 % (ref 39.0–52.0)
Hemoglobin: 14.3 g/dL (ref 13.0–17.0)
MCH: 31.1 pg (ref 26.0–34.0)
MCHC: 33.4 g/dL (ref 30.0–36.0)
MCV: 93 fL (ref 80.0–100.0)
Platelets: 194 10*3/uL (ref 150–400)
RBC: 4.6 MIL/uL (ref 4.22–5.81)
RDW: 13.2 % (ref 11.5–15.5)
WBC: 8.5 10*3/uL (ref 4.0–10.5)
nRBC: 0 % (ref 0.0–0.2)

## 2019-11-18 LAB — BASIC METABOLIC PANEL
Anion gap: 8 (ref 5–15)
BUN: 9 mg/dL (ref 8–23)
CO2: 26 mmol/L (ref 22–32)
Calcium: 9.4 mg/dL (ref 8.9–10.3)
Chloride: 105 mmol/L (ref 98–111)
Creatinine, Ser: 0.89 mg/dL (ref 0.61–1.24)
GFR calc Af Amer: >60 mL/min (ref >60)
GFR calc non Af Amer: >60 mL/min (ref >60)
Glucose, Bld: 108 mg/dL — ABNORMAL HIGH (ref 70–99)
Potassium: 4 mmol/L (ref 3.5–5.1)
Sodium: 139 mmol/L (ref 135–145)

## 2019-11-18 LAB — MAGNESIUM: Magnesium: 1.9 mg/dL (ref 1.7–2.4)

## 2019-11-18 SURGERY — CORONARY STENT INTERVENTION
Anesthesia: LOCAL

## 2019-11-18 MED ORDER — HEPARIN SODIUM (PORCINE) 1000 UNIT/ML IJ SOLN
INTRAMUSCULAR | Status: AC
  Filled 2019-11-18: qty 1

## 2019-11-18 MED ORDER — ONDANSETRON HCL 4 MG/2ML IJ SOLN: 4.0000 mg | Freq: Four times a day (QID) | INTRAMUSCULAR | Status: DC | PRN

## 2019-11-18 MED ORDER — VERAPAMIL HCL 2.5 MG/ML IV SOLN
INTRAVENOUS | Status: AC
  Filled 2019-11-18: qty 2

## 2019-11-18 MED ORDER — IOHEXOL 350 MG/ML SOLN
INTRAVENOUS | Status: AC
  Filled 2019-11-18: qty 1

## 2019-11-18 MED ORDER — NITROGLYCERIN 1 MG/10 ML FOR IR/CATH LAB
INTRA_ARTERIAL | Status: AC
  Filled 2019-11-18: qty 10

## 2019-11-18 MED ORDER — LIDOCAINE HCL (PF) 1 % IJ SOLN
INTRAMUSCULAR | Status: DC | PRN
  Administered 2019-11-18: 20 mL

## 2019-11-18 MED ORDER — MIDAZOLAM HCL 2 MG/2ML IJ SOLN
INTRAMUSCULAR | Status: DC | PRN
  Administered 2019-11-18: 2 mg via INTRAVENOUS
  Administered 2019-11-18: 1 mg via INTRAVENOUS

## 2019-11-18 MED ORDER — NITROGLYCERIN 1 MG/10 ML FOR IR/CATH LAB
INTRA_ARTERIAL | Status: DC | PRN
  Administered 2019-11-18 (×5): 200 ug via INTRACORONARY

## 2019-11-18 MED ORDER — MIDAZOLAM HCL 2 MG/2ML IJ SOLN
INTRAMUSCULAR | Status: AC
  Filled 2019-11-18: qty 2

## 2019-11-18 MED ORDER — SODIUM CHLORIDE 0.9 % IV SOLN
INTRAVENOUS | Status: AC | PRN
  Administered 2019-11-18 (×2): 1.75 mg/kg/h via INTRAVENOUS

## 2019-11-18 MED ORDER — LIDOCAINE HCL (PF) 1 % IJ SOLN
INTRAMUSCULAR | Status: AC
  Filled 2019-11-18: qty 30

## 2019-11-18 MED ORDER — IOHEXOL 350 MG/ML SOLN
INTRAVENOUS | Status: DC | PRN
  Administered 2019-11-18: 150 mL

## 2019-11-18 MED ORDER — ASPIRIN 81 MG PO CHEW
81.0000 mg | CHEWABLE_TABLET | Freq: Every day | ORAL | Status: DC
  Administered 2019-11-19: 81 mg via ORAL
  Filled 2019-11-18: qty 1

## 2019-11-18 MED ORDER — BIVALIRUDIN BOLUS VIA INFUSION - CUPID
INTRAVENOUS | Status: DC | PRN
  Administered 2019-11-18: 65.325 mg via INTRAVENOUS

## 2019-11-18 MED ORDER — SODIUM CHLORIDE 0.9 % IV SOLN: 250.0000 mL | INTRAVENOUS | Status: DC | PRN

## 2019-11-18 MED ORDER — BIVALIRUDIN TRIFLUOROACETATE 250 MG IV SOLR
INTRAVENOUS | Status: AC
  Filled 2019-11-18: qty 250

## 2019-11-18 MED ORDER — LABETALOL HCL 5 MG/ML IV SOLN
INTRAVENOUS | Status: AC
  Filled 2019-11-18: qty 4

## 2019-11-18 MED ORDER — FENTANYL CITRATE (PF) 100 MCG/2ML IJ SOLN
INTRAMUSCULAR | Status: AC
  Filled 2019-11-18: qty 2

## 2019-11-18 MED ORDER — HEPARIN (PORCINE) IN NACL 1000-0.9 UT/500ML-% IV SOLN
INTRAVENOUS | Status: DC | PRN
  Administered 2019-11-18 (×2): 500 mL

## 2019-11-18 MED ORDER — HYDRALAZINE HCL 20 MG/ML IJ SOLN
10.0000 mg | INTRAMUSCULAR | Status: AC | PRN
  Administered 2019-11-18: 10 mg via INTRAVENOUS
  Filled 2019-11-18: qty 1

## 2019-11-18 MED ORDER — CLOPIDOGREL BISULFATE 75 MG PO TABS: 75.0000 mg | ORAL_TABLET | Freq: Every day | ORAL | Status: DC

## 2019-11-18 MED ORDER — REPATHA SURECLICK 140 MG/ML ~~LOC~~ SOAJ: 140.0000 mg | SUBCUTANEOUS | 11 refills | Status: DC

## 2019-11-18 MED ORDER — LABETALOL HCL 5 MG/ML IV SOLN
10.0000 mg | INTRAVENOUS | Status: AC | PRN
  Administered 2019-11-18: 10 mg via INTRAVENOUS

## 2019-11-18 MED ORDER — NITROGLYCERIN IN D5W 200-5 MCG/ML-% IV SOLN: 0.0000 ug/min | INTRAVENOUS | Status: DC

## 2019-11-18 MED ORDER — SODIUM CHLORIDE 0.9% FLUSH: 3.0000 mL | INTRAVENOUS | Status: DC | PRN

## 2019-11-18 MED ORDER — SODIUM CHLORIDE 0.9 % IV SOLN: INTRAVENOUS | Status: DC

## 2019-11-18 MED ORDER — ACETAMINOPHEN 325 MG PO TABS: 650.0000 mg | ORAL_TABLET | ORAL | Status: DC | PRN

## 2019-11-18 MED ORDER — HEPARIN (PORCINE) IN NACL 1000-0.9 UT/500ML-% IV SOLN
INTRAVENOUS | Status: AC
  Filled 2019-11-18: qty 1000

## 2019-11-18 MED ORDER — FENTANYL CITRATE (PF) 100 MCG/2ML IJ SOLN
INTRAMUSCULAR | Status: DC | PRN
Start: 2019-11-18 — End: 2019-11-18
  Administered 2019-11-18: 25 ug via INTRAVENOUS
  Administered 2019-11-18: 50 ug via INTRAVENOUS

## 2019-11-18 MED ORDER — SODIUM CHLORIDE 0.9% FLUSH: 3.0000 mL | Freq: Two times a day (BID) | INTRAVENOUS | Status: DC

## 2019-11-18 SURGICAL SUPPLY — 20 items
BALLN EMERGE MR 2.0X12 (BALLOONS) ×2
BALLN SAPPHIRE ~~LOC~~ 3.75X8 (BALLOONS) ×1 IMPLANT
BALLN WOLVERINE 2.00X6 (BALLOONS) ×2
BALLN WOLVERINE 2.50X6 (BALLOONS) ×2
BALLOON EMERGE MR 2.0X12 (BALLOONS) IMPLANT
BALLOON WOLVERINE 2.00X6 (BALLOONS) IMPLANT
BALLOON WOLVERINE 2.50X6 (BALLOONS) IMPLANT
CATH INFINITI JR4 5F (CATHETERS) ×1 IMPLANT
CATH VISTA GUIDE 6FR XB3.5 (CATHETERS) ×1 IMPLANT
DEVICE CONTINUOUS FLUSH (MISCELLANEOUS) ×1 IMPLANT
ELECT DEFIB PAD ADLT CADENCE (PAD) ×1 IMPLANT
KIT ENCORE 26 ADVANTAGE (KITS) ×1 IMPLANT
KIT HEART LEFT (KITS) ×2 IMPLANT
PACK CARDIAC CATHETERIZATION (CUSTOM PROCEDURE TRAY) ×2 IMPLANT
SHEATH PINNACLE 6F 10CM (SHEATH) ×1 IMPLANT
STENT RESOLUTE ONYX 3.5X15 (Permanent Stent) ×1 IMPLANT
TRANSDUCER W/STOPCOCK (MISCELLANEOUS) ×2 IMPLANT
TUBING CIL FLEX 10 FLL-RA (TUBING) ×2 IMPLANT
WIRE COUGAR XT STRL 190CM (WIRE) ×1 IMPLANT
WIRE EMERALD 3MM-J .035X150CM (WIRE) ×1 IMPLANT

## 2019-11-18 NOTE — Progress Notes (Signed)
   Called with reports of that patient developed 10/10 chest pain around 6:15. Nitro drip up-titrated to max dose and patient received 4mg  of IV Morphine with only minimal improvement to 8/10. Went to bedside to see patient. He is in no acute distress. Still complain of left sided chest pain that has started to radiated down left arm. However, also reproducible on exam. He states this feels similar to the pain that initially brought him in. He had associated shortness of breath when pain first started but this has now improved. Currently ranks pain as a 7-8/10. Heart RRR with no significant murmur, gallops or rubs. Lungs clear to ausculation.   Patient scheduled for staged PCI this morning around 9:00am. Will move to first case. Discussed with cath lab.  Darreld Mclean, PA-C 11/18/2019 7:11 AM

## 2019-11-18 NOTE — H&P (View-Only) (Signed)
   Called with reports of that patient developed 10/10 chest pain around 6:15. Nitro drip up-titrated to max dose and patient received 4mg  of IV Morphine with only minimal improvement to 8/10. Went to bedside to see patient. He is in no acute distress. Still complain of left sided chest pain that has started to radiated down left arm. However, also reproducible on exam. He states this feels similar to the pain that initially brought him in. He had associated shortness of breath when pain first started but this has now improved. Currently ranks pain as a 7-8/10. Heart RRR with no significant murmur, gallops or rubs. Lungs clear to ausculation.   Patient scheduled for staged PCI this morning around 9:00am. Will move to first case. Discussed with cath lab.  Darreld Mclean, PA-C 11/18/2019 7:11 AM

## 2019-11-18 NOTE — Plan of Care (Signed)

## 2019-11-18 NOTE — Progress Notes (Signed)
Progress Note  Patient Name: Benjamin Robertson Date of Encounter: 11/18/2019  Primary Cardiologist: Ellyn Hack  Subjective   Developed 10 out of 10 chest pain this morning, nitroglycerin drip was titrated to max dose and also received IV morphine 4 mg.  He was taken to the Cath Lab for staged PCI, successful PCI to the 99% ostial LCx.  He is currently chest pain free after cath.  Inpatie developedn was titrated taken to Cath Labt Medications    Scheduled Meds: . [MAR Hold] arformoterol  15 mcg Nebulization BID   And  . [MAR Hold] umeclidinium bromide  1 puff Inhalation Daily  . [MAR Hold] aspirin  81 mg Oral Daily  . [MAR Hold] Chlorhexidine Gluconate Cloth  6 each Topical Daily  . [MAR Hold] clopidogrel  75 mg Oral Q breakfast  . [MAR Hold] metoprolol tartrate  25 mg Oral BID  . [MAR Hold] ramipril  10 mg Oral Daily  . [MAR Hold] sodium chloride flush  3 mL Intravenous Q12H  . sodium chloride flush  3 mL Intravenous Q12H   Continuous Infusions: . sodium chloride Stopped (11/17/19 0107)  . [MAR Hold] sodium chloride Stopped (11/16/19 1736)  . sodium chloride    . sodium chloride    . sodium chloride 1.163 mL/kg/hr (11/18/19 0816)  . [MAR Hold] nitroGLYCERIN 20 mcg/min (11/18/19 1114)   PRN Meds: [MAR Hold] sodium chloride, sodium chloride, [MAR Hold] acetaminophen, [MAR Hold] albuterol, [MAR Hold] ALPRAZolam, [MAR Hold]  morphine injection, [MAR Hold] nitroGLYCERIN, [MAR Hold] ondansetron (ZOFRAN) IV, [MAR Hold] sodium chloride flush, sodium chloride flush   Vital Signs    Vitals:   11/18/19 0932 11/18/19 0937 11/18/19 0958 11/18/19 1045  BP: (!) 178/92 (!) 155/83 (!) 158/64 (!) 130/57  Pulse: 73 76 69 71  Resp: (!) 8 (!) 27 18 18   Temp:      TempSrc:      SpO2: 100% 97% 91% 97%  Weight:      Height:        Intake/Output Summary (Last 24 hours) at 11/18/2019 1121 Last data filed at 11/18/2019 0731 Gross per 24 hour  Intake 1688.86 ml  Output 1625 ml  Net 63.86 ml     I/O since admission:   Filed Weights   11/16/19 0300 11/16/19 1128 11/18/19 0400  Weight: 86.2 kg 86 kg 87.1 kg    Telemetry    Sinus  ECG    ECG (independently read by me): NSR 67, q III, V1-3  Physical Exam    BP (!) 130/57   Pulse 71   Temp 98.9 F (37.2 C) (Oral)   Resp 18   Ht 5\' 8"  (1.727 m)   Wt 87.1 kg   SpO2 97%   BMI 29.20 kg/m    Blood pressure currently 114/74  General: Alert, oriented, no distress.  Skin: normal turgor, no rashes, warm and dry HEENT: Normocephalic, atraumatic. Pupils equal round and reactive to light; sclera anicteric; extraocular muscles intact; Nose without nasal septal hypertrophy Mouth/Parynx benign; Mallinpatti scale 3 Neck: No JVD, no carotid bruits; normal carotid upstroke Lungs: clear to ausculatation and percussion; no wheezing or rales Chest wall: Mild left costochondral chest wall tenderness Heart: PMI not displaced, RRR, s1 s2 normal, 1/6 systolic murmur, no diastolic murmur, no rubs, gallops, thrills, or heaves Abdomen: soft, nontender; no hepatosplenomehaly, BS+; abdominal aorta nontender and not dilated by palpation. Back: no CVA tenderness Pulses 2+; right groin cath site stable Musculoskeletal: full range of motion, normal strength,  no joint deformities Extremities: no clubbing cyanosis or edema, Homan's sign negative  Neurologic: grossly nonfocal; Cranial nerves grossly wnl Psychologic: Normal mood and affect  Labs    Chemistry Recent Labs  Lab 11/14/19 1913 11/14/19 2040 11/15/19 0158 11/17/19 0039 11/18/19 0211  NA   < >  --  139 140 139  K   < >  --  4.2 4.1 4.0  CL   < >  --  104 107 105  CO2   < >  --  26 26 26   GLUCOSE   < >  --  123* 108* 108*  BUN   < >  --  15 13 9   CREATININE   < >  --  0.96 1.04 0.89  CALCIUM   < >  --  9.3 9.0 9.4  PROT  --  6.9  --   --   --   ALBUMIN  --  4.0  --   --   --   AST  --  20  --   --   --   ALT  --  16  --   --   --   ALKPHOS  --  62  --   --   --    BILITOT  --  0.8  --   --   --   GFRNONAA   < >  --  >60 >60 >60  GFRAA   < >  --  >60 >60 >60  ANIONGAP   < >  --  9 7 8    < > = values in this interval not displayed.     Hematology Recent Labs  Lab 11/16/19 1128 11/17/19 0039 11/18/19 0211  WBC 8.2 7.7 8.5  RBC 4.74 4.47 4.60  HGB 14.7 13.8 14.3  HCT 44.5 42.3 42.8  MCV 93.9 94.6 93.0  MCH 31.0 30.9 31.1  MCHC 33.0 32.6 33.4  RDW 13.2 13.2 13.2  PLT 197 185 194    HS Trop: 81 > 118 > 177 > 465> 506 > 650  Cardiac EnzymesNo results for input(s): TROPONINI in the last 168 hours. No results for input(s): TROPIPOC in the last 168 hours.   BNPNo results for input(s): BNP, PROBNP in the last 168 hours.   DDimer No results for input(s): DDIMER in the last 168 hours.   Lipid Panel     Component Value Date/Time   CHOL 127 11/15/2019 0158   CHOL 232 (H) 06/17/2019 0813   TRIG 123 11/15/2019 0158   HDL 41 11/15/2019 0158   HDL 32 (L) 06/17/2019 0813   CHOLHDL 3.1 11/15/2019 0158   VLDL 25 11/15/2019 0158   LDLCALC 61 11/15/2019 0158   LDLCALC 152 (H) 06/17/2019 0813   LDLDIRECT 154.0 10/06/2019 1209     Radiology    CARDIAC CATHETERIZATION  Result Date: 11/18/2019  Ost LAD to Prox LAD lesion is 50% stenosed.  Lat 1st Mrg lesion is 90% stenosed.  Ost Cx lesion is 99% stenosed.  Previously placed Prox RCA to Mid RCA stent (unknown type) is widely patent.  Ramus-1 lesion is 80% stenosed.  Ramus-2 lesion is 100% stenosed.  Non-stenotic Mid LM to Ost LAD lesion.  Post intervention, there is a 0% residual stenosis.  Post intervention, there is a 0% residual stenosis.  A stent was successfully placed.  A stent was successfully placed.  Relook angiography of the RCA confirmed a widely patent mid RCA stent with brisk TIMI-3 flow and a moderate-sized distal  vessel. Successful PCI to the 99% ostial circumflex stenosis which appeared to be immediately proximal to the previously placed ostial circumflex stent treated with  initial 2.0 x 12 mm balloon angioplasty,  Cutting Balloon intervention with 2.0 x 6 mm and 2.5 x 6 mm Wolverine cutting balloons and ultimate insertion of a 3.5 x 15 mm Resolute Onyx stent extending from the proximal left main to just proximal to the distal aspect of the previously placed circumflex stent.  The 99% stenosis was reduced to 0%.  Post stent dilatation in the left main and ostial circumflex was performed up to 3.8 mm with 3.7 mm dilatation to the distal stent. RECOMMENDATION: Recommend continue DAPT therapy indefinitely.  Will wean and DC nitroglycerin as tolerated.  Patient is on PCSK9 inhibition due to statin intolerance.  Smoking cessation is essential.  At a time without wife    Cardiac Studies    Prox RCA to Mid RCA lesion is 100% stenosed.  Lat 1st Mrg lesion is 90% stenosed.  Ost Cx lesion is 90% stenosed.  Ramus lesion is 80% stenosed.  Ost LAD to Prox LAD lesion is 50% stenosed.  Post intervention, there is a 0% residual stenosis.  A stent was successfully placed.   Acute coronary syndrome secondary to total occlusion of the RCA at site of prior stent with initial TIMI 0 flow.  Left main is a normal vessel which gives rise to the LAD,  ramus intermediate and large codominant circumflex vessel.  The LAD had 50% ostial narrowing followed by 30% narrowing after the first diagonal vessel.  There was competitive filling to the mid LAD due to LIMA flow.  The ramus intermediate vessel was small caliber diffusely diseased proximally.  The left circumflex vessel had 95% ostial stenosis immediately prior to and including the proximal portion of the previously placed ostial circumflex stent.  The OM1 vessel had a small branch bifurcation with 90% stenosis.  The AV groove and distal circumflex vessel was angiographically normal.  Patent LIMA graft supplying the mid LAD.  Patent SVG graft supplying the diagonal vessel.  Patent SVG supplying the ramus immediate vessel  without filling retrograde to the ostium.  LVEDP 17 mmHg.  Successful PCI to the totally occluded native RCA with ultimate insertion of a 2.5 x 30 mm straight Resolute Onyx stent postdilated to 2.75 mm with the 1% occlusion being reduced to 0% with resumption of brisk TIMI-3 flow supplying a moderate size caliber distal RCA and PDA vessel.  RECOMMENDATION: DAPT for minimum of 1 year or indefinitely.  The patient will continue with bivalirudin for 4 hours post intervention.  His sheath will be pulled and heparin will be reinstituted 8 hours post sheath pull.  The patient will continue to be on intravenous nitroglycerin, currently at 50 mcg until his planned staged PCI to his ostial 95% circumflex vessel.  The patient has statin intolerance and is on PCSK9 inhibition with Repatha.  Smoking cessation is essential.   Intervention     ECHO IMPRESSIONS  1. Left ventricular ejection fraction, by estimation, is 55 to 60%. The  left ventricle has normal function. Left ventricular endocardial border  not optimally defined to evaluate regional wall motion, but appears  grossly normal. Left ventricular  diastolic parameters are consistent with Grade I diastolic dysfunction  (impaired relaxation).  2. Right ventricular systolic function is mildly reduced. The right  ventricular size is normal. Tricuspid regurgitation signal is inadequate  for assessing PA pressure.  3. The mitral valve is  normal in structure. Trivial mitral valve  regurgitation. No evidence of mitral stenosis.  4. The aortic valve is normal in structure. Aortic valve regurgitation is  trivial. No aortic stenosis is present.  5. The inferior vena cava is normal in size with greater than 50%  respiratory variability, suggesting right atrial pressure of 3 mmHg.   Patient Profile     69 y.o. male who has known CAD with prior stenting to his RCA, CABG revascularization surgery in 2001 with a LIMA to LAD, SVG to diagonal and  SVG to ramus intermediate and history of stenting to the ostium of his left circumflex vessel (unbypassed).  He presented to the hospital with increasing chest pain consistent with unstable angina.  He continues to have recurrent chest pain despite IV nitroglycerin and IV heparin and was taken acutely to the catheterization laboratory on November 16, 2019 for emergent cardiac catheterization.  Assessment & Plan    1.  Acute coronary syndrome/NSTEMI: Patient underwent emergent catheterization 9/5 which revealed total occlusion of the native RCA at prior stented site in addition to 90% stenosis at the ostium of the circumflex immediately after the left main takeoff at site of prior stenting.  All grafts were patent.  He underwent successful PCI to the totally occluded RCA with successful stenting.  Taken back to the cath lab this morning for staged PCI, successful PCI to the 99% ostial LCx.   In the past the patient did not tolerate Brilinta secondary to dyspnea.  He is now on aspirin/Plavix.  2.  Hyperlipidemia: LDL cholesterol 61, improved from 152 in April 2021.  History of statin intolerance.  On Repatha prior to admission.  3. Tobacco abuse: cessation strongly encouraged  4.  Essential hypertension:  on ramipril 10 mg.  Donato Heinz, MD

## 2019-11-18 NOTE — Interval H&P Note (Signed)
Cath Lab Visit (complete for each Cath Lab visit)  Clinical Evaluation Leading to the Procedure:   ACS: Yes.    Non-ACS:    Anginal Classification: CCS IV  Anti-ischemic medical therapy: Maximal Therapy (2 or more classes of medications)  Non-Invasive Test Results: No non-invasive testing performed  Prior CABG: Previous CABG      History and Physical Interval Note:  11/18/2019 7:46 AM  Benjamin Robertson  has presented today for surgery, with the diagnosis of CAD.  The various methods of treatment have been discussed with the patient and family. After consideration of risks, benefits and other options for treatment, the patient has consented to  Procedure(s): CORONARY STENT INTERVENTION (N/A) as a surgical intervention.  The patient's history has been reviewed, patient examined, no change in status, stable for surgery.  I have reviewed the patient's chart and labs.  Questions were answered to the patient's satisfaction.     [name]

## 2019-11-18 NOTE — Plan of Care (Signed)
Problem: Education: Goal: Knowledge of General Education information will improve Description: Including pain rating scale, medication(s)/side effects and non-pharmacologic comfort measures 11/18/2019 0916 by Trinna Balloon, RN Outcome: Progressing 11/18/2019 0916 by Trinna Balloon, RN Outcome: Progressing   Problem: Health Behavior/Discharge Planning: Goal: Ability to manage health-related needs will improve 11/18/2019 0916 by Trinna Balloon, RN Outcome: Progressing 11/18/2019 0916 by Trinna Balloon, RN Outcome: Progressing   Problem: Clinical Measurements: Goal: Ability to maintain clinical measurements within normal limits will improve 11/18/2019 0916 by Trinna Balloon, RN Outcome: Progressing 11/18/2019 0916 by Trinna Balloon, RN Outcome: Progressing Goal: Will remain free from infection 11/18/2019 0916 by Trinna Balloon, RN Outcome: Progressing 11/18/2019 0916 by Trinna Balloon, RN Outcome: Progressing Goal: Diagnostic test results will improve 11/18/2019 0916 by Trinna Balloon, RN Outcome: Progressing 11/18/2019 0916 by Trinna Balloon, RN Outcome: Progressing Goal: Respiratory complications will improve 11/18/2019 0916 by Trinna Balloon, RN Outcome: Progressing 11/18/2019 0916 by Trinna Balloon, RN Outcome: Progressing Goal: Cardiovascular complication will be avoided 11/18/2019 0916 by Trinna Balloon, RN Outcome: Progressing 11/18/2019 0916 by Trinna Balloon, RN Outcome: Progressing   Problem: Activity: Goal: Risk for activity intolerance will decrease 11/18/2019 0916 by Trinna Balloon, RN Outcome: Progressing 11/18/2019 0916 by Trinna Balloon, RN Outcome: Progressing   Problem: Nutrition: Goal: Adequate nutrition will be maintained 11/18/2019 0916 by Trinna Balloon, RN Outcome: Progressing 11/18/2019 0916 by Trinna Balloon, RN Outcome: Progressing   Problem: Coping: Goal: Level of anxiety will decrease 11/18/2019 0916 by Trinna Balloon, RN Outcome: Progressing 11/18/2019 0916 by Trinna Balloon, RN Outcome: Progressing   Problem: Elimination: Goal: Will not experience complications related to bowel motility 11/18/2019 0916 by Trinna Balloon, RN Outcome: Progressing 11/18/2019 0916 by Trinna Balloon, RN Outcome: Progressing Goal: Will not experience complications related to urinary retention 11/18/2019 0916 by Trinna Balloon, RN Outcome: Progressing 11/18/2019 0916 by Trinna Balloon, RN Outcome: Progressing   Problem: Pain Managment: Goal: General experience of comfort will improve 11/18/2019 0916 by Trinna Balloon, RN Outcome: Progressing 11/18/2019 0916 by Trinna Balloon, RN Outcome: Progressing   Problem: Safety: Goal: Ability to remain free from injury will improve 11/18/2019 0916 by Trinna Balloon, RN Outcome: Progressing 11/18/2019 0916 by Trinna Balloon, RN Outcome: Progressing   Problem: Skin Integrity: Goal: Risk for impaired skin integrity will decrease 11/18/2019 0916 by Trinna Balloon, RN Outcome: Progressing 11/18/2019 0916 by Trinna Balloon, RN Outcome: Progressing   Problem: Education: Goal: Understanding of cardiac disease, CV risk reduction, and recovery process will improve 11/18/2019 0916 by Trinna Balloon, RN Outcome: Progressing 11/18/2019 0916 by Trinna Balloon, RN Outcome: Progressing Goal: Understanding of medication regimen will improve 11/18/2019 0916 by Trinna Balloon, RN Outcome: Progressing 11/18/2019 0916 by Trinna Balloon, RN Outcome: Progressing Goal: Individualized Educational Video(s) 11/18/2019 0916 by Trinna Balloon, RN Outcome: Progressing 11/18/2019 0916 by Trinna Balloon, RN Outcome: Progressing   Problem: Activity: Goal: Ability to tolerate increased activity will improve 11/18/2019 0916 by Trinna Balloon, RN Outcome: Progressing 11/18/2019 0916 by Trinna Balloon, RN Outcome: Progressing   Problem: Cardiac: Goal: Ability to achieve and maintain adequate cardiopulmonary perfusion will improve 11/18/2019 0916 by Trinna Balloon,  RN Outcome: Progressing 11/18/2019 0916 by Trinna Balloon, RN Outcome: Progressing Goal: Vascular access site(s) Level 0-1 will be maintained 11/18/2019 0916 by Trinna Balloon, RN Outcome: Progressing  11/18/2019 0916 by Trinna Balloon, RN Outcome: Progressing   Problem: Health Behavior/Discharge Planning: Goal: Ability to safely manage health-related needs after discharge will improve 11/18/2019 0916 by Trinna Balloon, RN Outcome: Progressing 11/18/2019 0916 by Trinna Balloon, RN Outcome: Progressing

## 2019-11-19 ENCOUNTER — Telehealth: Payer: Self-pay | Admitting: Cardiology

## 2019-11-19 ENCOUNTER — Encounter (HOSPITAL_COMMUNITY): Payer: Self-pay | Admitting: Cardiovascular Disease

## 2019-11-19 DIAGNOSIS — Z720 Tobacco use: Secondary | ICD-10-CM

## 2019-11-19 LAB — BASIC METABOLIC PANEL
Anion gap: 7 (ref 5–15)
BUN: 10 mg/dL (ref 8–23)
CO2: 25 mmol/L (ref 22–32)
Calcium: 9.3 mg/dL (ref 8.9–10.3)
Chloride: 107 mmol/L (ref 98–111)
Creatinine, Ser: 0.78 mg/dL (ref 0.61–1.24)
GFR calc Af Amer: >60 mL/min (ref >60)
GFR calc non Af Amer: >60 mL/min (ref >60)
Glucose, Bld: 96 mg/dL (ref 70–99)
Potassium: 3.8 mmol/L (ref 3.5–5.1)
Sodium: 139 mmol/L (ref 135–145)

## 2019-11-19 LAB — CBC
HCT: 44.6 % (ref 39.0–52.0)
Hemoglobin: 14.6 g/dL (ref 13.0–17.0)
MCH: 30.8 pg (ref 26.0–34.0)
MCHC: 32.7 g/dL (ref 30.0–36.0)
MCV: 94.1 fL (ref 80.0–100.0)
Platelets: 179 10*3/uL (ref 150–400)
RBC: 4.74 MIL/uL (ref 4.22–5.81)
RDW: 13.2 % (ref 11.5–15.5)
WBC: 7.3 10*3/uL (ref 4.0–10.5)
nRBC: 0 % (ref 0.0–0.2)

## 2019-11-19 MED ORDER — CLOPIDOGREL BISULFATE 75 MG PO TABS: 75.0000 mg | ORAL_TABLET | Freq: Every day | ORAL | 3 refills | Status: DC

## 2019-11-19 MED ORDER — ASPIRIN 81 MG PO CHEW: 81.0000 mg | CHEWABLE_TABLET | Freq: Every day | ORAL | 3 refills | Status: DC

## 2019-11-19 MED FILL — CLOPIDOGREL 75 MG TABLET: 75 | 90 days supply | Qty: 90 | Fill #0

## 2019-11-19 NOTE — Discharge Summary (Addendum)
Discharge Summary    Patient ID: Benjamin Robertson,  MRN: [id number], DOB/AGE: 68-Jan-1952 69 y.o.  Admit date: 11/14/2019 Discharge date: 11/19/2019  Primary Care Provider: Viviana Simpler Robertson Primary Cardiologist: Benjamin Hew, MD  Discharge Diagnoses    Principal Problem:   ACS (acute coronary syndrome) [name] Active Problems:   Hyperlipidemia with target LDL less than 70   Essential hypertension   Unstable angina (HCC)   COPD (chronic obstructive pulmonary disease) ([name])   Tobacco abuse   Allergies Allergies  Allergen Reactions  . Brilinta [Ticagrelor] Shortness Of Breath    Having breathing issues  . Advicor [Niacin-Lovastatin Er] Other (See Comments)    Headache, possibly other reactions  . Lipitor [Atorvastatin] Other (See Comments)    Headache, possibly other reactions  . Pravachol [Pravastatin Sodium] Other (See Comments)    Headache, possibly other reactions  . Rosuvastatin Other (See Comments)     40 MG  TABLET--EXTREME FATIGUE  , MUSCLE  PAIN     Diagnostic Studies/Procedures    LHC 11/16/19:  Prox RCA to Mid RCA lesion is 100% stenosed.  Lat 1st Mrg lesion is 90% stenosed.  Ost Cx lesion is 90% stenosed.  Ramus lesion is 80% stenosed.  Ost LAD to Prox LAD lesion is 50% stenosed.  Post intervention, there is a 0% residual stenosis.  A stent was successfully placed.  Acute coronary syndrome secondary to total occlusion of the RCA at site of prior stent with initial TIMI 0 flow.  Left main is a normal vessel which gives rise to the LAD, ramus intermediate and large codominant circumflex vessel. The LAD had 50% ostial narrowing followed by 30% narrowing after the first diagonal vessel. There was competitive filling to the mid LAD due to LIMA flow.  The ramus intermediate vessel was small caliber diffusely diseased proximally.  The left circumflex vessel had 95% ostial stenosis immediately prior to and including the proximal portion of the previously  placed ostial circumflex stent. The OM1 vessel had a small branch bifurcation with 90% stenosis. The AV groove and distal circumflex vessel was angiographically normal.  Patent LIMA graft supplying the mid LAD.  Patent SVG graft supplying the diagonal vessel.  Patent SVG supplying the ramus immediate vessel without filling retrograde to the ostium.  LVEDP 17 mmHg.  Successful PCI to the totally occluded native RCA with ultimate insertion of a 2.5 x 30 mm straight Resolute Onyx stent postdilated to 2.75 mm with the 1% occlusion being reduced to 0% with resumption of brisk TIMI-3 flow supplying a moderate size caliber distal RCA and PDA vessel.  RECOMMENDATION: DAPT for minimum of 1 year or indefinitely. The patient will continue with bivalirudin for 4 hours post intervention. His sheath will be pulled and heparin will be reinstituted 8 hours post sheath pull. The patient will continue to be on intravenous nitroglycerin, currently at 50 mcg until his planned staged PCI to his ostial 95% circumflex vessel. The patient has statin intolerance and is on PCSK9 inhibition with Repatha. Smoking cessation is essential.   Intervention    Staged PCI 11/18/19:  Ost LAD to Prox LAD lesion is 50% stenosed.  Lat 1st Mrg lesion is 90% stenosed.  Ost Cx lesion is 99% stenosed.  Previously placed Prox RCA to Mid RCA stent (unknown type) is widely patent.  Ramus-1 lesion is 80% stenosed.  Ramus-2 lesion is 100% stenosed.  Non-stenotic Mid LM to Ost LAD lesion.  Post intervention, there is a 0% residual stenosis.  Post intervention, there is a 0% residual stenosis.  A stent was successfully placed.  A stent was successfully placed.  Relook angiography of the RCA confirmed a widely patent mid RCA stent with brisk TIMI-3 flow and a moderate-sized distal vessel.  Successful PCI to the 99% ostial circumflex stenosis which appeared to be immediately proximal to the previously  placed ostial circumflex stent treated with initial 2.0 x 12 mm balloon angioplasty, Cutting Balloon intervention with 2.0 x 6 mm and 2.5 x 6 mm Wolverine cutting balloons and ultimate insertion of a 3.5 x 15 mm Resolute Onyx stent extending from the proximal left main to just proximal to the distal aspect of the previously placed circumflex stent. The 99% stenosis was reduced to 0%. Post stent dilatation in the left main and ostial circumflex was performed up to 3.8 mm with 3.7 mm dilatation to the distal stent.  RECOMMENDATION: Recommend continue DAPT therapy indefinitely. Will wean and DC nitroglycerin as tolerated. Patient is on PCSK9 inhibition due to statin intolerance. Smoking cessation is essential.     Intervention    Echocardiogram 11/15/19: 1. Left ventricular ejection fraction, by estimation, is 55 to 60%. The  left ventricle has normal function. Left ventricular endocardial border  not optimally defined to evaluate regional wall motion, but appears  grossly normal. Left ventricular  diastolic parameters are consistent with Grade Robertson diastolic dysfunction  (impaired relaxation).  2. Right ventricular systolic function is mildly reduced. The right  ventricular size is normal. Tricuspid regurgitation signal is inadequate  for assessing PA pressure.  3. The mitral valve is normal in structure. Trivial mitral valve  regurgitation. No evidence of mitral stenosis.  4. The aortic valve is normal in structure. Aortic valve regurgitation is  trivial. No aortic stenosis is present.  5. The inferior vena cava is normal in size with greater than 50%  respiratory variability, suggesting right atrial pressure of 3 mmHg.  _____________   History of Present Illness     Benjamin Robertson is a 69 y.o. male with a history of CAD (s/p CABG with LIMA-LAD, SVG-Diag, SVG-RI and also PCI's to RCA & ostial LCx), ischemic cardiomyopathy, hypertension, hyperlipidemia, arthritis and tobacco  use, who was admitted to the hospital with complaints of chest pain.  Patient's symptoms started at nighttime when he experienced chest and neck pain.  He only had minimal relief with nitroglycerin.  The pain radiated to his back and was associated with nausea and some shortness of breath.  He also described transient lightheadedness.  In the emergency department he was treated with nitroglycerin, IV heparin and morphine.  The electrocardiogram showed minimal ST elevation in lead III with T wave inversions and slight ST depressions in the lateral leads.  The on-call interventional cardiologist was consulted but it was felt that the patient did not meet STEMI criteria.  The high-sensitivity troponins were 74, 81 and 118.  The other labs were as follows: Potassium 4.5, BUN 15, creatinine 1.17, WBC 9.7, hemoglobin 17.9, hematocrit 53.3 and platelets 253.  Covid test was negative.  The chest x-ray was unremarkable.   Hospital Course     Consultants: None   1. NSTEMI: patient presented with chest pain, found to have elevated HsTrop, peaked at 650. Echo showed EF 55-60%, unable to assess wall motion due to sub-optimal endocardial border, G1DD, mildly reduced RV systolic function, and no significant valvular abnormalities. Chest pain persisted despite IV heparin and IV nitro gtt and he was taken to the cath lab 11/16/19  where he was found to have 100% occlusion of RCA at site of previous stenting managed with PCI/DES, additional with 95% LCx stenosis prior to previously places stent recommended for staged intervention; he had patent LIMA to LAD, SVG to diagonal, and SVG to ramus. He underwent staged intervention 11/18/19 with successful PCI/DES to ostial LCx. Chest pain resolved. On plavix and aspirin for DAPT after not tolerating brilinta 2/2 dyspnea. Cath sites C/D/Robertson without significant hematoma - Continue aspirin and plavix - Continue repatha - Continue BBlocker  2. HTN: BP generally above goal of  <130/80 on home ramipril and metoprolol. He reports BP is well controlled at home and recently had his BP machine accuracy checked in his PCP office. He does report some "white coat" syndrome tendencies  - Will continue home ramipril and metoprolol for now given patient's hesitancy to adjust medications - Encouraged close monitoring at home   3. HLD: LDL 61, improved from 152 06/2019. Currently on repatha - Continue repatha  4. Tobacco abuse: smoking ~3 packs per week. Recently prescribed chantix by his PCP but has not started yet due to recall. Anticipates starting once recall is cleared - Continue to encourage cessation  5. COPD:  - Home inhalers continued on discharge _____________  Discharge Vitals Blood pressure (!) 149/69, pulse 86, temperature 98.9 F (37.2 C), temperature source Oral, resp. rate 17, height 5\' 8"  (1.727 m), weight 86 kg, SpO2 92 %.  Filed Weights   11/16/19 1128 11/18/19 0400 11/19/19 0553  Weight: 86 kg 87.1 kg 86 kg   Physical exam on the day of discharge:  GEN: No acute distress.   Neck: No JVD, no carotid bruits Cardiac: RRR, no murmurs, rubs, or gallops. Right groin cath site without hematoma or ecchymosis Respiratory: Clear to auscultation bilaterally, no wheezes/ rales/ rhonchi GI: NABS, Soft, obese, nontender, non-distended  MS: No edema; No deformity. Neuro:  Nonfocal, moving all extremities spontaneously Psych: Normal affect     Labs & Radiologic Studies    CBC Recent Labs    11/18/19 0211 11/19/19 0605  WBC 8.5 7.3  HGB 14.3 14.6  HCT 42.8 44.6  MCV 93.0 94.1  PLT 194 563   Basic Metabolic Panel Recent Labs    11/18/19 0211 11/19/19 0605  NA 139 139  K 4.0 3.8  CL 105 107  CO2 26 25  GLUCOSE 108* 96  BUN 9 10  CREATININE 0.89 0.78  CALCIUM 9.4 9.3  MG 1.9  --    Liver Function Tests No results for input(s): AST, ALT, ALKPHOS, BILITOT, PROT, ALBUMIN in the last 72 hours. No results for input(s): LIPASE, AMYLASE in the  last 72 hours. Cardiac Enzymes No results for input(s): CKTOTAL, CKMB, CKMBINDEX, TROPONINI in the last 72 hours. BNP Invalid input(s): POCBNP D-Dimer No results for input(s): DDIMER in the last 72 hours. Hemoglobin A1C No results for input(s): HGBA1C in the last 72 hours. Fasting Lipid Panel No results for input(s): CHOL, HDL, LDLCALC, TRIG, CHOLHDL, LDLDIRECT in the last 72 hours. Thyroid Function Tests No results for input(s): TSH, T4TOTAL, T3FREE, THYROIDAB in the last 72 hours.  Invalid input(s): FREET3 _____________  DG Chest 2 View  Result Date: 11/14/2019 CLINICAL DATA:  Chest pain EXAM: CHEST - 2 VIEW COMPARISON:  01/29/2015 FINDINGS: Lungs are clear. No pneumothorax or pleural effusion. Coronary artery bypass grafting has been performed. Cardiac size within normal limits. The pulmonary vascularity is normal. Degenerative changes are seen within the thoracic spine. IMPRESSION: No acute process in the  chest. Electronically Signed   By: Fidela Salisbury MD   On: 11/14/2019 19:33   CARDIAC CATHETERIZATION  Result Date: 11/18/2019  Ost LAD to Prox LAD lesion is 50% stenosed.  Lat 1st Mrg lesion is 90% stenosed.  Ost Cx lesion is 99% stenosed.  Previously placed Prox RCA to Mid RCA stent (unknown type) is widely patent.  Ramus-1 lesion is 80% stenosed.  Ramus-2 lesion is 100% stenosed.  Non-stenotic Mid LM to Ost LAD lesion.  Post intervention, there is a 0% residual stenosis.  Post intervention, there is a 0% residual stenosis.  A stent was successfully placed.  A stent was successfully placed.  Relook angiography of the RCA confirmed a widely patent mid RCA stent with brisk TIMI-3 flow and a moderate-sized distal vessel. Successful PCI to the 99% ostial circumflex stenosis which appeared to be immediately proximal to the previously placed ostial circumflex stent treated with initial 2.0 x 12 mm balloon angioplasty,  Cutting Balloon intervention with 2.0 x 6 mm and 2.5 x 6 mm  Wolverine cutting balloons and ultimate insertion of a 3.5 x 15 mm Resolute Onyx stent extending from the proximal left main to just proximal to the distal aspect of the previously placed circumflex stent.  The 99% stenosis was reduced to 0%.  Post stent dilatation in the left main and ostial circumflex was performed up to 3.8 mm with 3.7 mm dilatation to the distal stent. RECOMMENDATION: Recommend continue DAPT therapy indefinitely.  Will wean and DC nitroglycerin as tolerated.  Patient is on PCSK9 inhibition due to statin intolerance.  Smoking cessation is essential.  At a time without wife   CARDIAC CATHETERIZATION  Result Date: 11/16/2019  Prox RCA to Mid RCA lesion is 100% stenosed.  Lat 1st Mrg lesion is 90% stenosed.  Ost Cx lesion is 90% stenosed.  Ramus lesion is 80% stenosed.  Ost LAD to Prox LAD lesion is 50% stenosed.  Post intervention, there is a 0% residual stenosis.  A stent was successfully placed.  Acute coronary syndrome secondary to total occlusion of the RCA at site of prior stent with initial TIMI 0 flow. Left main is a normal vessel which gives rise to the LAD,  ramus intermediate and large codominant circumflex vessel.  The LAD had 50% ostial narrowing followed by 30% narrowing after the first diagonal vessel.  There was competitive filling to the mid LAD due to LIMA flow. The ramus intermediate vessel was small caliber diffusely diseased proximally. The left circumflex vessel had 95% ostial stenosis immediately prior to and including the proximal portion of the previously placed ostial circumflex stent.  The OM1 vessel had a small branch bifurcation with 90% stenosis.  The AV groove and distal circumflex vessel was angiographically normal. Patent LIMA graft supplying the mid LAD. Patent SVG graft supplying the diagonal vessel. Patent SVG supplying the ramus immediate vessel without filling retrograde to the ostium. LVEDP 17 mmHg. Successful PCI to the totally occluded native RCA  with ultimate insertion of a 2.5 x 30 mm straight Resolute Onyx stent postdilated to 2.75 mm with the 1% occlusion being reduced to 0% with resumption of brisk TIMI-3 flow supplying a moderate size caliber distal RCA and PDA vessel. RECOMMENDATION: DAPT for minimum of 1 year or indefinitely.  The patient will continue with bivalirudin for 4 hours post intervention.  His sheath will be pulled and heparin will be reinstituted 8 hours post sheath pull.  The patient will continue to be on intravenous nitroglycerin, currently at  50 mcg until his planned staged PCI to his ostial 95% circumflex vessel.  The patient has statin intolerance and is on PCSK9 inhibition with Repatha.  Smoking cessation is essential.   ECHOCARDIOGRAM COMPLETE  Result Date: 11/15/2019    ECHOCARDIOGRAM REPORT   Patient Name:   Benjamin Robertson Date of Exam: 11/15/2019 Medical Rec #:  [id number]    Height:       68.0 in Accession #:    [id number]   Weight:       187.6 lb Date of Birth:  [birth year]     BSA:          1.989 m Patient Age:    28 years     BP:           92/60 mmHg Patient Gender: M            HR:           67 bpm. Exam Location:  Inpatient Procedure: 2D Echo, Cardiac Doppler and Color Doppler Indications:    NSTEMI I21.4  History:        Patient has prior history of Echocardiogram examinations, most                 recent 05/06/2012. Cardiomyopathy, CAD, Prior CABG, COPD; Risk                 Factors:Hypertension, Dyslipidemia and Current Smoker. ST                 elevation myocardial infarction (STEMI) of inferior wall (HCC)                 (From Hx).  Sonographer:    Alvino Chapel RCS Referring Phys: [id number] Arp  1. Left ventricular ejection fraction, by estimation, is 55 to 60%. The left ventricle has normal function. Left ventricular endocardial border not optimally defined to evaluate regional wall motion, but appears grossly normal. Left ventricular diastolic parameters are consistent with Grade Robertson diastolic  dysfunction (impaired relaxation).  2. Right ventricular systolic function is mildly reduced. The right ventricular size is normal. Tricuspid regurgitation signal is inadequate for assessing PA pressure.  3. The mitral valve is normal in structure. Trivial mitral valve regurgitation. No evidence of mitral stenosis.  4. The aortic valve is normal in structure. Aortic valve regurgitation is trivial. No aortic stenosis is present.  5. The inferior vena cava is normal in size with greater than 50% respiratory variability, suggesting right atrial pressure of 3 mmHg. FINDINGS  Left Ventricle: Left ventricular ejection fraction, by estimation, is 55 to 60%. The left ventricle has normal function. Left ventricular endocardial border not optimally defined to evaluate regional wall motion. The left ventricular internal cavity size was normal in size. There is no left ventricular hypertrophy. Left ventricular diastolic parameters are consistent with Grade Robertson diastolic dysfunction (impaired relaxation). Right Ventricle: The right ventricular size is normal. No increase in right ventricular wall thickness. Right ventricular systolic function is mildly reduced. Tricuspid regurgitation signal is inadequate for assessing PA pressure. Left Atrium: Left atrial size was normal in size. Right Atrium: Right atrial size was normal in size. Pericardium: There is no evidence of pericardial effusion. Mitral Valve: The mitral valve is normal in structure. Normal mobility of the mitral valve leaflets. Trivial mitral valve regurgitation. No evidence of mitral valve stenosis. Tricuspid Valve: The tricuspid valve is normal in structure. Tricuspid valve regurgitation is trivial. No evidence of tricuspid stenosis. Aortic Valve: The aortic  valve is normal in structure. Aortic valve regurgitation is trivial. No aortic stenosis is present. Pulmonic Valve: The pulmonic valve was normal in structure. Pulmonic valve regurgitation is mild to moderate. No  evidence of pulmonic stenosis. Aorta: The aortic root is normal in size and structure. Venous: The inferior vena cava is normal in size with greater than 50% respiratory variability, suggesting right atrial pressure of 3 mmHg. IAS/Shunts: No atrial level shunt detected by color flow Doppler.  LEFT VENTRICLE PLAX 2D LVIDd:         4.80 cm  Diastology LVIDs:         3.00 cm  LV e' lateral:   8.59 cm/s LV PW:         1.00 cm  LV E/e' lateral: 4.6 LV IVS:        0.90 cm  LV e' medial:    7.07 cm/s LVOT diam:     2.10 cm  LV E/e' medial:  5.5 LV SV:         55 LV SV Index:   28 LVOT Area:     3.46 cm  RIGHT VENTRICLE RV S prime:     6.32 cm/s TAPSE (M-mode): 1.8 cm LEFT ATRIUM             Index       RIGHT ATRIUM           Index LA diam:        4.10 cm 2.06 cm/m  RA Area:     17.70 cm LA Vol (A2C):   39.8 ml 20.01 ml/m RA Volume:   51.10 ml  25.69 ml/m LA Vol (A4C):   45.8 ml 23.03 ml/m LA Biplane Vol: 44.8 ml 22.52 ml/m  AORTIC VALVE LVOT Vmax:   92.40 cm/s LVOT Vmean:  53.000 cm/s LVOT VTI:    0.159 m  AORTA Ao Root diam: 3.50 cm MITRAL VALVE MV Area (PHT): 3.39 cm    SHUNTS MV Decel Time: 224 msec    Systemic VTI:  0.16 m MV E velocity: 39.20 cm/s  Systemic Diam: 2.10 cm Cherlynn Kaiser MD Electronically signed by Cherlynn Kaiser MD Signature Date/Time: 11/15/2019/5:36:23 PM    Final    Disposition   Patient was seen and examined by Dr. Gardiner Rhyme who deemed patient as stable for discharge. Follow-up has been arranged. Discharge medications as listed below.   Follow-up Plans & Appointments     Follow-up Information    Abigail Butts., PA-C Follow up on 11/28/2019.   Specialty: Physician Assistant Why: Please arrive 15 minutes early for your 11:15am post-hospital cardiology follow-up appointment.  Contact information: [address] [place] [address] [phone]              Discharge Instructions    Amb Referral to Cardiac Rehabilitation   Complete by: As directed     Diagnosis:  NSTEMI Coronary Stents     After initial evaluation and assessments completed: Virtual Based Care may be provided alone or in conjunction with Phase 2 Cardiac Rehab based on patient barriers.: Yes      Discharge Medications   Allergies as of 11/19/2019      Reactions   Brilinta [ticagrelor] Shortness Of Breath   Having breathing issues   Advicor [niacin-lovastatin Er] Other (See Comments)   Headache, possibly other reactions   Lipitor [atorvastatin] Other (See Comments)   Headache, possibly other reactions   Pravachol [pravastatin Sodium] Other (See Comments)   Headache, possibly other reactions  Rosuvastatin Other (See Comments)    40 MG  TABLET--EXTREME FATIGUE  , MUSCLE  PAIN       Medication List    STOP taking these medications   aspirin 325 MG tablet Replaced by: aspirin 81 MG chewable tablet   naproxen sodium 220 MG tablet Commonly known as: ALEVE     TAKE these medications   albuterol 108 (90 Base) MCG/ACT inhaler Commonly known as: VENTOLIN HFA Inhale 2 puffs into the lungs every 6 (six) hours as needed for wheezing or shortness of breath.   aspirin 81 MG chewable tablet Chew 1 tablet (81 mg total) by mouth daily. Start taking on: November 20, 2019 Replaces: aspirin 325 MG tablet   clopidogrel 75 MG tablet Commonly known as: PLAVIX Take 1 tablet (75 mg total) by mouth daily with breakfast. Start taking on: November 20, 2019   Magnesium Oxide 250 MG Tabs Take 1 tablet by mouth daily.   metoprolol tartrate 50 MG tablet Commonly known as: LOPRESSOR TAKE ONE-HALF TABLET BY  MOUTH TWICE DAILY   nitroGLYCERIN 0.4 MG/SPRAY spray Commonly known as: NITROLINGUAL Place 1 spray under the tongue every 5 (five) minutes x 3 doses as needed for chest pain.   ramipril 10 MG capsule Commonly known as: ALTACE TAKE 1 CAPSULE BY MOUTH  DAILY   Repatha SureClick 017 MG/ML Soaj Generic drug: Evolocumab Inject 140 mg into the skin every 14 (fourteen)  days.   Spiriva Respimat 1.25 MCG/ACT Aers Generic drug: Tiotropium Bromide Monohydrate Take 1 puff by mouth daily.   varenicline 0.5 MG X 11 & 1 MG X 42 tablet Commonly known as: CHANTIX PAK Take one 0.5 mg tablet by mouth once daily for 3 days, then increase to one 0.5 mg tablet twice daily for 4 days, then increase to one 1 mg tablet twice daily.   varenicline 1 MG tablet Commonly known as: CHANTIX Take 1 tablet (1 mg total) by mouth 2 (two) times daily.        Aspirin prescribed at discharge?  Yes High Intensity Statin Prescribed? (Lipitor 40-80mg  or Crestor 20-40mg ): No: on Repatha Beta Blocker Prescribed? Yes For EF <40%, was ACEI/ARB Prescribed? Yes ADP Receptor Inhibitor Prescribed? (Robertson.e. Plavix etc.-Includes Medically Managed Patients): Yes For EF <40%, Aldosterone Inhibitor Prescribed? No: N/A Was EF assessed during THIS hospitalization? Yes Was Cardiac Rehab II ordered? (Included Medically managed Patients): Yes   Outstanding Labs/Studies   None  Duration of Discharge Encounter   Greater than 30 minutes including physician time.  Signed, Abigail Butts PA-C 11/19/2019, 10:40 AM    Patient seen and examined.  Agree with above documentation:  GEN: Well nourished, well developed, in no acute distress HEENT: normal Neck: no JVD, carotid bruits, or masses Cardiac: RRR; no murmurs, rubs, or gallops,no edema  Respiratory:  clear to auscultation bilaterally, normal work of breathing GI: soft, nontender MS: no deformity.  Cath site c/d/Robertson, no evidence of hematoma Skin: warm and dry, no rash Neuro:  Alert and Oriented x 3, Strength and sensation are intact Psych: normal affect  S/p staged PCI to occluded RCA on 9/5 then PCI to 95% ostial circumflex.  Will discharge on aspirin and Plavix.  He will continue Repatha, LDL at goal < 70 (61).  Continue ramipril and metoprolol.  Strongly encouraged smoking cessation.  Donato Heinz, MD

## 2019-11-19 NOTE — Progress Notes (Signed)
CARDIAC REHAB PHASE I   PRE:  Rate/Rhythm: 82 SR  BP:  Supine:   Sitting: 149/69  Standing:    SaO2: 95%RA  MODE:  Ambulation: 550 ft   POST:  Rate/Rhythm: 100 ST PVCs  BP:  Supine:   Sitting: 174/74  Standing:    SaO2: 93%RA [id number] Pt walked 550 ft on RA with steady gait and no CP. Tolerated well. MI education completed with pt and wife who voiced understanding. Stressed importance of plavix with stent. Reviewed NTG use, MI restrictions, heart healthy food choices, and walking for exercise. Discussed CRP 2 and referred to [place]. Pt will consider but likes to walk on his own for exercise. Gave wife's number to be contacted re program.    Graylon Good, RN BSN  11/19/2019 9:14 AM

## 2019-11-19 NOTE — Telephone Encounter (Signed)
Patient is scheduled for TOC on 11/28/19 at 11:15am with Roby Lofts per Roby Lofts.

## 2019-11-19 NOTE — Discharge Instructions (Addendum)
Femoral Site Care This sheet gives you information about how to care for yourself after your procedure. Your health care provider may also give you more specific instructions. If you have problems or questions, contact your health care provider. What can I expect after the procedure? After the procedure, it is common to have:  Bruising that usually fades within 1-2 weeks.  Tenderness at the site. Follow these instructions at home: Wound care  Follow instructions from your health care provider about how to take care of your insertion site. Make sure you: ? Wash your hands with soap and water before you change your bandage (dressing). If soap and water are not available, use hand sanitizer. ? Change your dressing as told by your health care provider. ? Leave stitches (sutures), skin glue, or adhesive strips in place. These skin closures may need to stay in place for 2 weeks or longer. If adhesive strip edges start to loosen and curl up, you may trim the loose edges. Do not remove adhesive strips completely unless your health care provider tells you to do that.  Do not take baths, swim, or use a hot tub until your health care provider approves.  You may shower 24-48 hours after the procedure or as told by your health care provider. ? Gently wash the site with plain soap and water. ? Pat the area dry with a clean towel. ? Do not rub the site. This may cause bleeding.  Do not apply powder or lotion to the site. Keep the site clean and dry.  Check your femoral site every day for signs of infection. Check for: ? Redness, swelling, or pain. ? Fluid or blood. ? Warmth. ? Pus or a bad smell. Activity  For the first 2-3 days after your procedure, or as long as directed: ? Avoid climbing stairs as much as possible. ? Do not squat.  Do not lift anything that is heavier than 10 lb (4.5 kg), or the limit that you are told, until your health care provider says that it is safe.  Rest as  directed. ? Avoid sitting for a long time without moving. Get up to take short walks every 1-2 hours.  Do not drive for 24 hours if you were given a medicine to help you relax (sedative). General instructions  Take over-the-counter and prescription medicines only as told by your health care provider.  Keep all follow-up visits as told by your health care provider. This is important. Contact a health care provider if you have:  A fever or chills.  You have redness, swelling, or pain around your insertion site. Get help right away if:  The catheter insertion area swells very fast.  You pass out.  You suddenly start to sweat or your skin gets clammy.  The catheter insertion area is bleeding, and the bleeding does not stop when you hold steady pressure on the area.  The area near or just beyond the catheter insertion site becomes pale, cool, tingly, or numb. These symptoms may represent a serious problem that is an emergency. Do not wait to see if the symptoms will go away. Get medical help right away. Call your local emergency services (911 in the [place] Do not drive yourself to the hospital. Summary  After the procedure, it is common to have bruising that usually fades within 1-2 weeks.  Check your femoral site every day for signs of infection.  Do not lift anything that is heavier than 10 lb (4.5 kg), or the   limit that you are told, until your health care provider says that it is safe. This information is not intended to replace advice given to you by your health care provider. Make sure you discuss any questions you have with your health care provider. Document Revised: 03/12/2017 Document Reviewed: 03/12/2017 Elsevier Patient Education  [place] TO BRING ALL OF YOUR MEDICATIONS TO EACH OF YOUR FOLLOW-UP OFFICE VISITS.  PLEASE ATTEND ALL SCHEDULED FOLLOW-UP APPOINTMENTS.   Activity: Increase activity slowly as tolerated. You may shower, but no  soaking baths (or swimming) for 1 week. No driving for 1 week. No lifting over 5 lbs for 1 week. No sexual activity for 1 week.   Wound Care: You may wash cath site gently with soap and water. Keep cath site clean and dry. If you notice pain, swelling, bleeding or pus at your cath site, please call [phone].  Please take your aspirin and plavix as prescribed and avoid missing doses which can put you at risk of forming a blockage in your stent and having another heart attack.   Please avoid taking over the counter anti-inflammatory medications (ibuprofen, advil, motrin, naproxen, naprosyn, BC powder, etc) as these medications can increase your bleeding risk while on aspirin and plavix. Use tylenol (acetaminophen) for pain control as directed on the packaging.   Please keep a log of your blood pressure over the next 1-2 weeks

## 2019-11-20 MED FILL — Verapamil HCl IV Soln 2.5 MG/ML: INTRAVENOUS | Qty: 2 | Status: AC

## 2019-11-21 NOTE — Telephone Encounter (Signed)
Patient contacted regarding discharge from [facility] on 11/19/19.  Patient understands to follow up with Roby Lofts on 11/28/19 at 11:15 am at [facility] office. Patient understands discharge instructions. Patient understands medications and regiment. Patient understands to bring all medications to this visit.

## 2019-11-27 ENCOUNTER — Telehealth (HOSPITAL_COMMUNITY): Payer: Self-pay

## 2019-11-27 NOTE — Telephone Encounter (Signed)
Called and spoke with pt wife Benjamin Robertson who stated pt is not interested in CR.  Closed referral

## 2019-11-27 NOTE — Progress Notes (Signed)
Cardiology Office Note   Date:  11/28/2019   ID:  Benjamin Robertson, DOB Jul 31, 1950, MRN [id number]  PCP:  Venia Carbon, MD  Cardiologist:  Glenetta Hew, MD EP: None  Chief Complaint  Patient presents with   Hospitalization Follow-up    recent NSTEMI      History of Present Illness: Benjamin Robertson is a 69 y.o. male with PMH of CAD s/p CABG in 2001 with subsequent PCI/DES to RCA and LCx 11/2019, ICM with recovery in EF, HTN, HLD, COPD, and tobacco abuse, who presents for post-hospital follow-up.  He was recently admitted to [facility] under the cardiology service from 11/14/19-11/19/19 after presenting with chest pain, found to have elevated HsTrop, peaked at 650. Echo showed EF 55-60%, unable to assess wall motion due to sub-optimal endocardial border, G1DD, mildly reduced RV systolic function, and no significant valvular abnormalities. Chest pain persisted despite IV heparin and IV nitro gtt and he was taken to the cath lab 11/16/19 where he was found to have 100% occlusion of RCA at site of previous stenting managed with PCI/DES, additional with 95% LCx stenosis prior to previously places stent recommended for staged intervention; he had patent LIMA to LAD, SVG to diagonal, and SVG to ramus. He underwent staged intervention 11/18/19 with successful PCI/DES to ostial LCx. Chest pain resolved. He was started on plavix and aspirin for DAPT after not tolerating brilinta 2/2 dyspnea.  He presents today for post-hospital follow-up. He has been doing well since discharge. Energy level is perking up following stent placement. He continues to have some trouble clearing mucus in his chest at night. This has been going on for a little while and is unchanged. Encouraged him to follow-up with his PCP who was considering a referral to pulmonology. He has no complaints of chest pain, SOB, DOE, palpitations, dizziness, lightheadedness, syncope, orthopnea, PND, or LE edema. He reports he has not smoked in 12 days -  he was congratulated on this accomplishment!    Past Medical History:  Diagnosis Date   Arthritis    intermittent   CAD in native artery 1994, 2001   a) 2001 Exertional Angina --> Myoview: Inf infarct and Ant ischemia --> Cath: 95% pLAD, 99% D1, 80% RI, but Cx-OM1&2 OK , & patent RCA stent --> CABG: b) Cath in 2003 patent grafts, RCA & Cx; c) 08/2009 Myoview: Mod Basal-Mid inferoseptal, Basal-apical inferior Infarct. No ischemia.; d) May 2016: PCI to Ostial Cx (not bypassed) - Xience DES 3.0 x 15 (3.6 mm)   Childhood asthma    Hemorrhoids    history of   History of ST elevation myocardial infarction (STEMI) of inferior wall 1994; 1997   PCI of RCA - redo PCI BMS ML Vision 3.0 mm x 25 mm to RCA in 1997   Hyperlipidemia LDL goal <70    At goal by Most recent labs October 2013: TC 123, TG 83, HDL 81, LDL 65   Hypertension    Ischemic cardiomyopathy 1994, 2003   Echo February 2014: EF 40-45%; moderate HK of anterolateral myocardium.; Grade 1 diastolic dysfunction   Kidney stones    3 times had lithrotripsy   Obesity (BMI 30.0-34.9)    Pneumonia 2014   S/P CABG x 3 2001   LIMA-LAD, SVG-diagonal, SVG-Ramus Intermedius (RI) - patent by Cath in 2003    Past Surgical History:  Procedure Laterality Date   CARDIAC CATHETERIZATION  06/06/1999   Recommend CABG   CARDIAC CATHETERIZATION  10/04/2001  Patent grafts with a 50% lesion in LAD beyond IMA insertion   CARDIAC CATHETERIZATION N/A 07/31/2014   Procedure: Left Heart Cath and Cors/Grafts Angiography;  Surgeon: Jettie Booze, MD;  Location: [place] [facility];  Service: Cardiovascular;  Laterality: N/A;   CARDIAC CATHETERIZATION N/A 07/31/2014   Procedure: Coronary Stent Intervention;  Surgeon: Jettie Booze, MD;  Location: [place] [facility];  Service: Cardiovascular: PCI to Ostial Cx (not bypassed) - Xience DES 3.0 x 15 (3.6 mm)   CAROTID DOPPLER  05/06/2012   Less than 50% diameter reduction of the Lft  Subclavian.   CORONARY ANGIOGRAPHY N/A 11/18/2019   Procedure: CORONARY ANGIOGRAPHY;  Surgeon: Troy Sine, MD;  Location: [place] [facility];  Service: Cardiovascular;  Laterality: N/A;   CORONARY ANGIOPLASTY WITH STENT PLACEMENT  01/21/1996   PTCA of RCA-95% lesion/per wife 2015   CORONARY ARTERY BYPASS GRAFT  06/07/1999   x3. LIMA to distal LAD, SVG to first diag, and SVG to ramus   CORONARY STENT INTERVENTION N/A 11/16/2019   Procedure: CORONARY STENT INTERVENTION;  Surgeon: Troy Sine, MD;  Location: [place] [facility];  Service: Cardiovascular;  Laterality: N/A;   CORONARY STENT INTERVENTION N/A 11/18/2019   Procedure: CORONARY STENT INTERVENTION;  Surgeon: Troy Sine, MD;  Location: [place] [facility];  Service: Cardiovascular;  Laterality: N/A;   CORONARY/GRAFT ACUTE MI REVASCULARIZATION N/A 11/16/2019   Procedure: Coronary/Graft Acute MI Revascularization;  Surgeon: Troy Sine, MD;  Location: [place] [facility];  Service: Cardiovascular;  Laterality: N/A;   HYDROCELE EXCISION Left 02/28/2016   Procedure: HYDROCELECTOMY ADULT;  Surgeon: Kathie Rhodes, MD;  Location: [facility];  Service: Urology;  Laterality: Left;   LEFT HEART CATH AND CORS/GRAFTS ANGIOGRAPHY N/A 11/16/2019   Procedure: LEFT HEART CATH AND CORS/GRAFTS ANGIOGRAPHY;  Surgeon: Troy Sine, MD;  Location: [place] [facility];  Service: Cardiovascular;  Laterality: N/A;   LITHOTRIPSY  "2-3 times"   1988/2003/2007   NM MYOVIEW LTD  08/19/2009   Moderafte perfusion seen in Basal inferoseptal, Basal inferior, Mid inferoseptal, Mid inferiorm, and Apical inferior regions. No ECG changes. EKG negative for ischemia.   TRANSTHORACIC ECHOCARDIOGRAM  05/06/2012   EF 40-45%, LV cavity moderately dilated, systolic function mild-moderately reduced, moderate hypokinesis of anterolatewral myocardium.     Current Outpatient Medications  Medication Sig Dispense Refill   albuterol (VENTOLIN HFA) 108 (90 Base) MCG/ACT  inhaler Inhale 2 puffs into the lungs every 6 (six) hours as needed for wheezing or shortness of breath. 18 g 1   aspirin 81 MG chewable tablet Chew 1 tablet (81 mg total) by mouth daily. 90 tablet 3   clopidogrel (PLAVIX) 75 MG tablet Take 1 tablet (75 mg total) by mouth daily with breakfast. 90 tablet 3   Evolocumab (REPATHA SURECLICK) 124 MG/ML SOAJ Inject 140 mg into the skin every 14 (fourteen) days. 2 mL 11   Magnesium Oxide 250 MG TABS Take 1 tablet by mouth daily.      metoprolol tartrate (LOPRESSOR) 50 MG tablet TAKE ONE-HALF TABLET BY  MOUTH TWICE DAILY 90 tablet 3   nitroGLYCERIN (NITROLINGUAL) 0.4 MG/SPRAY spray Place 1 spray under the tongue every 5 (five) minutes x 3 doses as needed for chest pain. 4.9 g 2   ramipril (ALTACE) 10 MG capsule TAKE 1 CAPSULE BY MOUTH  DAILY 90 capsule 3   Tiotropium Bromide Monohydrate (SPIRIVA RESPIMAT) 1.25 MCG/ACT AERS Take 1 puff by mouth daily. 12 g 3  varenicline (CHANTIX PAK) 0.5 MG X 11 & 1 MG X 42 tablet Take one 0.5 mg tablet by mouth once daily for 3 days, then increase to one 0.5 mg tablet twice daily for 4 days, then increase to one 1 mg tablet twice daily. 53 tablet 0   varenicline (CHANTIX) 1 MG tablet Take 1 tablet (1 mg total) by mouth 2 (two) times daily. 168 tablet 3   No current facility-administered medications for this visit.    Allergies:   Brilinta [ticagrelor], Advicor [niacin-lovastatin er], Lipitor [atorvastatin], Pravachol [pravastatin sodium], Sulfa antibiotics, and Rosuvastatin    Social History:  The patient  reports that he has been smoking cigarettes. He has a 24.00 pack-year smoking history. He has never used smokeless tobacco. He reports that he does not drink alcohol and does not use drugs.   Family History:  The patient's family history includes Heart attack in his mother; Heart disease in his mother; Lung cancer in his sister; Lymphoma in his sister; Stroke in his sister.    ROS:  Please see the  history of present illness.   Otherwise, review of systems are positive for none.   All other systems are reviewed and negative.    PHYSICAL EXAM: VS:  BP (!) 149/77    Pulse 61    Ht 5\' 8"  (1.727 m)    Wt 188 lb 9.6 oz (85.5 kg)    SpO2 94%    BMI 28.68 kg/m  , BMI Body mass index is 28.68 kg/m. GEN: Well nourished, well developed, in no acute distress HEENT: sclera anicteric Neck: no JVD, carotid bruits, or masses Cardiac: RRR; no murmurs, rubs, or gallops, no edema  Respiratory:  clear to auscultation bilaterally, normal work of breathing GI: soft, nontender, nondistended, + BS MS: no deformity or atrophy Skin: warm and dry, no rash Neuro:  Strength and sensation are intact Psych: euthymic mood, full affect   EKG:  EKG is ordered today. The ekg ordered today demonstrates sinus rhythm, rate 61 bpm, no STE/D, no TWI.   Recent Labs: 11/14/2019: ALT 16 11/18/2019: Magnesium 1.9 11/19/2019: BUN 10; Creatinine, Ser 0.78; Hemoglobin 14.6; Platelets 179; Potassium 3.8; Sodium 139    Lipid Panel    Component Value Date/Time   CHOL 127 11/15/2019 0158   CHOL 232 (H) 06/17/2019 0813   TRIG 123 11/15/2019 0158   HDL 41 11/15/2019 0158   HDL 32 (L) 06/17/2019 0813   CHOLHDL 3.1 11/15/2019 0158   VLDL 25 11/15/2019 0158   LDLCALC 61 11/15/2019 0158   LDLCALC 152 (H) 06/17/2019 0813   LDLDIRECT 154.0 10/06/2019 1209      Wt Readings from Last 3 Encounters:  11/28/19 188 lb 9.6 oz (85.5 kg)  11/19/19 189 lb 8 oz (86 kg)  10/06/19 195 lb (88.5 kg)      Other studies Reviewed: Additional studies/ records that were reviewed today include:   Echocardiogram 11/15/19: 1. Left ventricular ejection fraction, by estimation, is 55 to 60%. The  left ventricle has normal function. Left ventricular endocardial border  not optimally defined to evaluate regional wall motion, but appears  grossly normal. Left ventricular  diastolic parameters are consistent with Grade I diastolic dysfunction    (impaired relaxation).  2. Right ventricular systolic function is mildly reduced. The right  ventricular size is normal. Tricuspid regurgitation signal is inadequate  for assessing PA pressure.  3. The mitral valve is normal in structure. Trivial mitral valve  regurgitation. No evidence of mitral stenosis.  4. The aortic valve is normal in structure. Aortic valve regurgitation is  trivial. No aortic stenosis is present.  5. The inferior vena cava is normal in size with greater than 50%  respiratory variability, suggesting right atrial pressure of 3 mmHg.   LHC 11/16/19:  Prox RCA to Mid RCA lesion is 100% stenosed.  Lat 1st Mrg lesion is 90% stenosed.  Ost Cx lesion is 90% stenosed.  Ramus lesion is 80% stenosed.  Ost LAD to Prox LAD lesion is 50% stenosed.  Post intervention, there is a 0% residual stenosis.  A stent was successfully placed.  Acute coronary syndrome secondary to total occlusion of the RCA at site of prior stent with initial TIMI 0 flow.  Left main is a normal vessel which gives rise to the LAD, ramus intermediate and large codominant circumflex vessel. The LAD had 50% ostial narrowing followed by 30% narrowing after the first diagonal vessel. There was competitive filling to the mid LAD due to LIMA flow.  The ramus intermediate vessel was small caliber diffusely diseased proximally.  The left circumflex vessel had 95% ostial stenosis immediately prior to and including the proximal portion of the previously placed ostial circumflex stent. The OM1 vessel had a small branch bifurcation with 90% stenosis. The AV groove and distal circumflex vessel was angiographically normal.  Patent LIMA graft supplying the mid LAD.  Patent SVG graft supplying the diagonal vessel.  Patent SVG supplying the ramus immediate vessel without filling retrograde to the ostium.  LVEDP 17 mmHg.  Successful PCI to the totally occluded native RCA with ultimate  insertion of a 2.5 x 30 mm straight Resolute Onyx stent postdilated to 2.75 mm with the 1% occlusion being reduced to 0% with resumption of brisk TIMI-3 flow supplying a moderate size caliber distal RCA and PDA vessel.  RECOMMENDATION: DAPT for minimum of 1 year or indefinitely. The patient will continue with bivalirudin for 4 hours post intervention. His sheath will be pulled and heparin will be reinstituted 8 hours post sheath pull. The patient will continue to be on intravenous nitroglycerin, currently at 50 mcg until his planned staged PCI to his ostial 95% circumflex vessel. The patient has statin intolerance and is on PCSK9 inhibition with Repatha. Smoking cessation is essential.   Intervention    Staged PCI 11/18/19:  Ost LAD to Prox LAD lesion is 50% stenosed.  Lat 1st Mrg lesion is 90% stenosed.  Ost Cx lesion is 99% stenosed.  Previously placed Prox RCA to Mid RCA stent (unknown type) is widely patent.  Ramus-1 lesion is 80% stenosed.  Ramus-2 lesion is 100% stenosed.  Non-stenotic Mid LM to Ost LAD lesion.  Post intervention, there is a 0% residual stenosis.  Post intervention, there is a 0% residual stenosis.  A stent was successfully placed.  A stent was successfully placed.  Relook angiography of the RCA confirmed a widely patent mid RCA stent with brisk TIMI-3 flow and a moderate-sized distal vessel.  Successful PCI to the 99% ostial circumflex stenosis which appeared to be immediately proximal to the previously placed ostial circumflex stent treated with initial 2.0 x 12 mm balloon angioplasty, Cutting Balloon intervention with 2.0 x 6 mm and 2.5 x 6 mm Wolverine cutting balloons and ultimate insertion of a 3.5 x 15 mm Resolute Onyx stent extending from the proximal left main to just proximal to the distal aspect of the previously placed circumflex stent. The 99% stenosis was reduced to 0%. Post stent dilatation in the left main  and ostial circumflex  was performed up to 3.8 mm with 3.7 mm dilatation to the distal stent.  RECOMMENDATION: Recommend continue DAPT therapy indefinitely. Will wean and DC nitroglycerin as tolerated. Patient is on PCSK9 inhibition due to statin intolerance. Smoking cessation is essential.     Intervention     ASSESSMENT AND PLAN:   1. CAD s/p CABG in 2001 with subsequent PCI/DES to RCA and LCx 11/2019: No recurrent chest pain - Continue aspirin and plavix - Continue repatha - Continue BBlocker  2. HTN:BP 149/77 today. Reports BP continues to be in the 120s/70s at home, rarely with SBP in the 130s. Encouraged them to have their machine checked to confirm accuracy - they will take it to a local fire station   - Continue ramipril and metoprolol for now given patient's hesitancy to adjust medications - low threshold to uptitrate ramipril to 20mg  daily - Encouraged close monitoring at home   3. HLD:LDL 61 11/15/19, improved from 152 06/2019. Currently on repatha - Continue repatha  4. Tobacco abuse:smoking ~3 packs per week prior to his recent admission. Now 12 days without smoking!!  - Continue to encourage cessation  5. ICM: recovery of EF 55-60% on echo 11/15/19. No volume overload complaints.  - Continue metoprolol and ramipril   Current medicines are reviewed at length with the patient today.  The patient does not have concerns regarding medicines.  The following changes have been made:  As above  Labs/ tests ordered today include:   Orders Placed This Encounter  Procedures   EKG 12-Lead     Disposition:   FU with Dr. Ellyn Hack in 3 months  Signed, Abigail Butts, PA-C  11/28/2019 12:18 PM

## 2019-11-27 NOTE — Telephone Encounter (Signed)
Pt insurance is active and benefits verified through [facility]. Co-pay $0.00, DED $0.00/$0.00 met, out of pocket $4,500.00/$161.82 met, co-insurance 0%. No pre-authorization required. Passport, 11/27/19 @ 11:52AM, DFP#[id number]  Will contact patient to see if he is interested in the Cardiac Rehab Program. If interested, patient will need to complete follow up appt. Once completed, patient will be contacted for scheduling upon review by the RN Navigator.

## 2019-11-28 ENCOUNTER — Other Ambulatory Visit: Payer: Self-pay

## 2019-11-28 ENCOUNTER — Encounter: Payer: Self-pay | Admitting: Medical

## 2019-11-28 ENCOUNTER — Ambulatory Visit: Payer: Medicare Other | Admitting: Medical

## 2019-11-28 VITALS — BP 149/77 | HR 61 | Ht 68.0 in | Wt 188.6 lb

## 2019-11-28 DIAGNOSIS — I255 Ischemic cardiomyopathy: Secondary | ICD-10-CM

## 2019-11-28 DIAGNOSIS — E785 Hyperlipidemia, unspecified: Secondary | ICD-10-CM | POA: Diagnosis not present

## 2019-11-28 DIAGNOSIS — Z720 Tobacco use: Secondary | ICD-10-CM

## 2019-11-28 DIAGNOSIS — I10 Essential (primary) hypertension: Secondary | ICD-10-CM

## 2019-11-28 DIAGNOSIS — I2510 Atherosclerotic heart disease of native coronary artery without angina pectoris: Secondary | ICD-10-CM | POA: Diagnosis not present

## 2019-11-28 NOTE — Patient Instructions (Signed)
Medication Instructions:  No changes *If you need a refill on your cardiac medications before your next appointment, please call your pharmacy*   Lab Work: None labs Indicated If you have labs (blood work) drawn today and your tests are completely normal, you will receive your results only by:  [place] (if you have MyChart) OR  A paper copy in the mail If you have any lab test that is abnormal or we need to change your treatment, we will call you to review the results.   Testing/Procedures: None Ordered   Follow-Up: At [facility], you and your health needs are our priority.  As part of our continuing mission to provide you with exceptional heart care, we have created designated Provider Care Teams.  These Care Teams include your primary Cardiologist (physician) and Advanced Practice Providers (APPs -  Physician Assistants and Nurse Practitioners) who all work together to provide you with the care you need, when you need it.  We recommend signing up for the patient portal called "MyChart".  Sign up information is provided on this After Visit Summary.  MyChart is used to connect with patients for Virtual Visits (Telemedicine).  Patients are able to view lab/test results, encounter notes, upcoming appointments, etc.  Non-urgent messages can be sent to your provider as well.   To learn more about what you can do with MyChart, go to [email or web address].    Your next appointment:   3 month(s)  The format for your next appointment:   In Person  Provider:   Glenetta Hew, MD   Other Instructions Monitor Blood Pressure

## 2019-11-29 ENCOUNTER — Encounter: Payer: Self-pay | Admitting: Cardiology

## 2019-12-22 ENCOUNTER — Telehealth: Payer: Self-pay

## 2019-12-22 DIAGNOSIS — J449 Chronic obstructive pulmonary disease, unspecified: Secondary | ICD-10-CM

## 2019-12-22 NOTE — Telephone Encounter (Signed)
Pt's spouse called requesting pt be referred to Pulmonology for COPD management as she reports his Sx are worsening... please advise

## 2019-12-22 NOTE — Telephone Encounter (Signed)
You can let him know the referral was placed and he should here about it in the next week

## 2019-12-23 NOTE — Telephone Encounter (Signed)
Spoke to  pt's wife .

## 2019-12-29 NOTE — Telephone Encounter (Signed)
Spoke with [facility] - first available "urgent" appt is 01/16/20 at 3pm with Dr Ander Slade in [place]. NO sooner appts with , [employer]. I asked if there was possibly someone that could see him sooner and the receptionist stated that they did not have anything any sooner.   Spoke with patient wife, she states that they will take the Nov 5th appt but that his breathing is getting worse - having increased SOB, unable to lay in bed to sleep, having to sleep in the recliner, breathing is shallow at night and O2 is dropping to 85%. Denies CP or tightness in chest with the SOB or low O2 levels. Pt did have a heart attack on 9/3 and stents placed - wife states that his breathing has never returned to baseline after that event.  Requests a sooner appt.   [facility] to see if able to get him in [facility]. They can get him this week, 01/01/20 at 10:15 (10:00 arrival) with Dr Raul Del at [facility], [place], [place] [address]. Insurance and demos faxed to Dr [employer] office.    Spoke with [facility] and they are working to see if they are able to get the patient worked in any sooner so that we can keep his care within the system. The patients information has been passed along and to review and someone will let me know if they are able to work him in sooner.

## 2019-12-29 NOTE — Telephone Encounter (Signed)
Pt's wife called and is upset that no one has called her in ref to pt's referral.  She says his breathing is getting worse at night.  She is requesting a c/b, says you can try cell [phone], if no answer try home, [phone]. Thank you!

## 2019-12-29 NOTE — Telephone Encounter (Signed)
Pt wife is going to contact insurance to see if [facility] is in network .   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 See notes below of low O2 levels (85%) and increased SOB. Pt has been using Zarbees Cough Syrup Dark Honey to help try and move the mucous and make him cough. Pt uses Spiriva daily. Albuterol HFA does not seem to help so he is not using that. Spiriva was working well before he had the heart attack. Pt seen by [facility] in July/August and was given an abx for bronchitis - has been treated (2-3 times) since heart attack for chronic bronchitis. Pt was given Prednisone. Negative Covid. Xrays to check lungs. Pt has been on 3 rounds of abx since last visit with Dr Silvio Pate 10/06/19. Pt is wanting to know if there is anything else that can be done to help his breathing until he is able to see Pulmonary.   [facility] can see the patient on 01/01/20 at 3pm - Dr Dr[facility] was able to work patient in sooner on Tue 01/06/20 at 9:30am - Dr Melvyn Novas  Patient is going to speak with insurance and let me know which appt they want.   Please advise, thanks.  (see message below to follow referral process)

## 2019-12-29 NOTE — Telephone Encounter (Signed)
I am not sure if this is pulmonary or cardiac. My impression initially was that this was not an acute change, but more slow decline. I am not sure what to do before he is seen, or even if this is lungs as opposed to heart. I hope he can hold out till appt in 2 days

## 2019-12-29 NOTE — Telephone Encounter (Signed)
Spoke with patient wife, aware of recommendations per Dr Silvio Pate. She will call if his condition worsens before his appt later this week.  Nothing further needed.

## 2020-01-01 DIAGNOSIS — F17210 Nicotine dependence, cigarettes, uncomplicated: Secondary | ICD-10-CM | POA: Diagnosis not present

## 2020-01-01 DIAGNOSIS — R0602 Shortness of breath: Secondary | ICD-10-CM | POA: Diagnosis not present

## 2020-01-01 DIAGNOSIS — R059 Cough, unspecified: Secondary | ICD-10-CM | POA: Diagnosis not present

## 2020-01-01 DIAGNOSIS — I517 Cardiomegaly: Secondary | ICD-10-CM | POA: Diagnosis not present

## 2020-01-01 DIAGNOSIS — J449 Chronic obstructive pulmonary disease, unspecified: Secondary | ICD-10-CM | POA: Diagnosis not present

## 2020-01-06 ENCOUNTER — Institutional Professional Consult (permissible substitution): Payer: Medicare Other | Admitting: Internal Medicine

## 2020-01-14 ENCOUNTER — Other Ambulatory Visit: Payer: Self-pay | Admitting: Cardiology

## 2020-01-16 ENCOUNTER — Institutional Professional Consult (permissible substitution): Payer: Medicare Other | Admitting: Pulmonary Disease

## 2020-01-20 DIAGNOSIS — F17210 Nicotine dependence, cigarettes, uncomplicated: Secondary | ICD-10-CM | POA: Diagnosis not present

## 2020-01-20 DIAGNOSIS — J449 Chronic obstructive pulmonary disease, unspecified: Secondary | ICD-10-CM | POA: Diagnosis not present

## 2020-01-20 DIAGNOSIS — R0600 Dyspnea, unspecified: Secondary | ICD-10-CM | POA: Diagnosis not present

## 2020-01-30 ENCOUNTER — Ambulatory Visit: Payer: Medicare Other | Admitting: Internal Medicine

## 2020-02-18 ENCOUNTER — Other Ambulatory Visit: Payer: Self-pay | Admitting: Specialist

## 2020-02-18 ENCOUNTER — Other Ambulatory Visit (HOSPITAL_COMMUNITY): Payer: Self-pay | Admitting: Specialist

## 2020-02-18 DIAGNOSIS — R059 Cough, unspecified: Secondary | ICD-10-CM | POA: Diagnosis not present

## 2020-02-18 DIAGNOSIS — R0600 Dyspnea, unspecified: Secondary | ICD-10-CM

## 2020-02-18 DIAGNOSIS — R0609 Other forms of dyspnea: Secondary | ICD-10-CM

## 2020-02-18 DIAGNOSIS — J439 Emphysema, unspecified: Secondary | ICD-10-CM | POA: Diagnosis not present

## 2020-02-26 ENCOUNTER — Encounter: Payer: Self-pay | Admitting: Cardiology

## 2020-02-26 ENCOUNTER — Other Ambulatory Visit: Payer: Self-pay

## 2020-02-26 ENCOUNTER — Ambulatory Visit: Payer: Medicare Other | Admitting: Cardiology

## 2020-02-26 VITALS — BP 136/76 | HR 62 | Ht 68.0 in | Wt 179.6 lb

## 2020-02-26 DIAGNOSIS — J449 Chronic obstructive pulmonary disease, unspecified: Secondary | ICD-10-CM

## 2020-02-26 DIAGNOSIS — E669 Obesity, unspecified: Secondary | ICD-10-CM

## 2020-02-26 DIAGNOSIS — Z9861 Coronary angioplasty status: Secondary | ICD-10-CM

## 2020-02-26 DIAGNOSIS — I10 Essential (primary) hypertension: Secondary | ICD-10-CM | POA: Diagnosis not present

## 2020-02-26 DIAGNOSIS — E785 Hyperlipidemia, unspecified: Secondary | ICD-10-CM | POA: Diagnosis not present

## 2020-02-26 DIAGNOSIS — F17210 Nicotine dependence, cigarettes, uncomplicated: Secondary | ICD-10-CM

## 2020-02-26 DIAGNOSIS — G720 Drug-induced myopathy: Secondary | ICD-10-CM

## 2020-02-26 DIAGNOSIS — I2510 Atherosclerotic heart disease of native coronary artery without angina pectoris: Secondary | ICD-10-CM | POA: Diagnosis not present

## 2020-02-26 DIAGNOSIS — E66811 Obesity, class 1: Secondary | ICD-10-CM

## 2020-02-26 DIAGNOSIS — I255 Ischemic cardiomyopathy: Secondary | ICD-10-CM

## 2020-02-26 DIAGNOSIS — T466X5A Adverse effect of antihyperlipidemic and antiarteriosclerotic drugs, initial encounter: Secondary | ICD-10-CM

## 2020-02-26 MED ORDER — VALSARTAN 40 MG PO TABS: ORAL_TABLET | ORAL | 3 refills | Status: DC

## 2020-02-26 MED ORDER — CLOPIDOGREL BISULFATE 75 MG PO TABS: 75.0000 mg | ORAL_TABLET | Freq: Every day | ORAL | 3 refills | Status: AC

## 2020-02-26 NOTE — Patient Instructions (Signed)
Medication Instructions:  Restart Plavix 75 mg daily Stop Ramipril Start Valsartan 40 mg daily Continue all other medications *If you need a refill on your cardiac medications before your next appointment, please call your pharmacy*   Lab Work: None ordered   Testing/Procedures: None ordered   Follow-Up: At [facility], you and your health needs are our priority.  As part of our continuing mission to provide you with exceptional heart care, we have created designated Provider Care Teams.  These Care Teams include your primary Cardiologist (physician) and Advanced Practice Providers (APPs -  Physician Assistants and Nurse Practitioners) who all work together to provide you with the care you need, when you need it.  We recommend signing up for the patient portal called "MyChart".  Sign up information is provided on this After Visit Summary.  MyChart is used to connect with patients for Virtual Visits (Telemedicine).  Patients are able to view lab/test results, encounter notes, upcoming appointments, etc.  Non-urgent messages can be sent to your provider as well.   To learn more about what you can do with MyChart, go to [email or web address].    Your next appointment:  Monday 05/17/20 at 11:20 am   The format for your next appointment: Office     Provider:  Dr.Harding

## 2020-02-26 NOTE — Progress Notes (Signed)
Primary Care Provider: Venia Carbon, MD Cardiologist: Glenetta Hew, MD Electrophysiologist: None  Clinic Note: Chief Complaint  Patient presents with  . Coronary Artery Disease    No further chest pain following recent PCI  . Follow-up    83-month  . Hyperlipidemia    Statin myalgias-on Repatha  . Hypertension    Still having cough    Problem List Items Addressed This Visit    CAD in native artery - status post CABG x3 after PCI x2 to the RCA (LIMA-LAD, SVG-diagonal SVG-RI - Primary (Chronic)    Now completely revascularized with graft to the ramus intermedius and LAD-diagonal as well as stents in the RCA and LCx.  No further anginal symptoms since most recent non-STEMI.  Plan: On DAPT x1 year (aspirin and Plavix)--was intolerant of Brilinta.  On low-dose beta-blocker-unable to titrate because of bradycardia.  Remains on Repatha-statin intolerant because of myopathy/myalgia  Is on ACE inhibitor which will convert to ARB because of cough  Smoking cessation counseling      Relevant Medications   valsartan (DIOVAN) 40 MG tablet   CAD- BMS PCI RCA ('97) - overlapping DES PCI for IST ('21); DES PCI Ost LCx ('16) - Overlapping DES LM-LCx for ISR ('21) (Chronic)    Now overlapping stents in both RCA and ostial LCx from Left Main into LCx.  Very important to remain on at a minimum Plavix essentially lifelong now.   Recommend uninterrupted DAPT x1 year into October 2022 (okay to hold aspirin for bleeding or bruising but otherwise restart once stable)      Relevant Medications   valsartan (DIOVAN) 40 MG tablet   Ischemic cardiomyopathy (Chronic)    EF resolved now back to normal.  No clinical signs or symptoms of CHF.      Relevant Medications   valsartan (DIOVAN) 40 MG tablet   Hyperlipidemia with target LDL less than 70 (Chronic)    Labs checked at time of his MRI showed LDL within target zone of less than 70.  Has not been able to tolerate statin or any other  medication including Zetia or Vascepa.      Relevant Medications   valsartan (DIOVAN) 40 MG tablet   Essential hypertension (Chronic)    Up-and-down blood pressures both at home and here.  He is on referral but having a cough.  Plan will be to convert to valsartan 40 mg daily and continue metoprolol at current dose.      Relevant Medications   valsartan (DIOVAN) 40 MG tablet   Statin myopathy (Chronic)    Has been intolerant of every type of statin you have tried.  Has had significant myopathies of myalgias.  Recently started on Repatha with appropriate control of lipids.      Cigarette smoker one half pack a day or less (Chronic)    He was doing really well on Chantix, unfortunately it is now off the market.  Will defer to PCP who is managing this, but may need to consider Wellbutrin or other options.  He is still close to finally being able to quit.  Encouraged his efforts.      Obesity (BMI 30.0-34.9) (Chronic)    Actually with weight loss, he is now below the "obesity" zone.      COPD (chronic obstructive pulmonary disease) ([place])    Follow-up with pulmonary medicine.  CT scan pending.      Relevant Medications   TRELEGY ELLIPTA 100-62.5-25 MCG/INH AEPB   predniSONE (DELTASONE) 5 MG  tablet      HPI:    Benjamin Robertson is a 69 y.o. male with a PMH notable for CAD (CABG in 2001, DES PCI-RCA and LCx in 11/2019), ICM with improved EF, HTN/HLD, chronic tobacco abuse with COPD to who presents today for 32-month follow-up.  Cardiac Hx  1994 - Inf STEMI PTCA RCA   1997 - BMS PCI RCA  2001 - Progressive Angina -> RCA stent patent & LCx-OM OK, Ost LAD & RI disease --> CABG x 3 (LIMA-LAD, SVG-D1, SVG-RI); - Low EF   2003 - Patent Stent & Grafts.   2011 - Myoview: Mod Basal-Mid Inferoseptal & Basal-apical Inferior Infarct, No Ischemia; Low ER recsolved.   04/2014 - NSTEMI - > DES PCI Ost LCx (Xience DES 3 x 15 - 3.6 mm)  11/2019 - NSTEMI  -> 100% RCA ISR (DES PCI Resolute Onyx  2.5 x 30 from distal stent - well prox -- 2.8), 80% Cx ISR (Staged DES PCI Res. Onyx 3.5 x 15 -> LM-LCx -> 3.8-37 mm)  Recent Hospitalizations:   9/3-10/2019-[facility] admission for non-STEMI -> Cath 100% ISR/thrombosis of RCA & 95% LCx (patent LIMA-LAD, SVG-Diag & SVG-RI) -> PCI RCA wit staged LCx PCI. --> Dyspnea with Brilinta -> converted to Plavix.   Benjamin Robertson was last seen on September 17 by Roby Lofts, PA for post hospital follow-up.  Doing well since discharge.  Energy level perking up.  Still having some trouble Q clearing mucus at night.  No further chest pain, SLB, DOE, palpitations, lightheadedness, orthopnea PND or edema. ->  Also was 12 days abstinent of cigarettes.  Continued beta-blocker and Repatha along with DAPT.   Reviewed  CV studies:    The following studies were reviewed today: (if available, images/films reviewed: From Epic Chart or Care Everywhere)  ECHO 11/15/2019: EF ~55 to 60%. ~ normal WM. Gr 1 DD. Unable to assess PAP, but normal RAP. Relatively normal Atriae.    LHC 11/16/19: prox-mid RCA stent 100% ISR/thrombosis (DES PCI). Ost LCx 90% ISR (Staged PCI) & Lat OM1 90%. Ost-prox RI 80%, Ost-prox LAD 50%; Patent Grafts.  Prox-mid RCA stent 100% -> DES PCI Resolute DES 2.5 x 30 mm (2.8 mm).  Staged DES PCI  11/18/2019: LM-LCx  80% -> Scoring Balloon PTCA - DES PCI -> Resolute Onyx DES 3.5 x 15 -- 3.8 mm         Interval History:   Benjamin Robertson returns here today for 26-month follow-up still doing fairly well.  He has lost now what seems to be a total of 30 pounds in the past year or so.  10 more pounds since last visit.  He has started back a little cigarettes but a pack a lasted maybe 4 days.  He was unable to fully quit, but is definitely try to work on it.  Unfortunately having Chantix taken off the market has made a significant difference.  He says the breathing issues that he had while being on Brilinta have improved some but he still has an  annoying cough and congestion issues.  He has been referred to pulmonary medicine with plans for CT scan of the chest.  Thankfully, he is not having any further chest pain or pressure.  He is now on Repatha for his lipids and seems to doing fairly well.  He tells me his blood pressures at home usually range anywhere from 120/70 up to 140/80 with rare readings as high as 160/90.  He is  trying to be back activity have been before.  Previously he would split more shop or do yard work.  Lots of activity, but no exercise regimen.  Is now 3 years out from retirement and has much less stress.  CV Review of Symptoms (Summary): positive for - dyspnea on exertion, shortness of breath and Dyspnea is really back to his baseline if not somewhat improved.  Still has some coughing mucus production negative for - chest pain, edema, irregular heartbeat, orthopnea, palpitations, paroxysmal nocturnal dyspnea, rapid heart rate or Lightheadedness, dizziness or syncope/near syncope, TIA/amaurosis fugax, claudication  The patient does not have symptoms concerning for COVID-19 infection (fever, chills, cough, or new shortness of breath).   REVIEWED OF SYSTEMS   Review of Systems  Constitutional: Positive for weight loss (Feeling pretty good.  Another 10 pounds since last visit). Negative for malaise/fatigue (Energy level definitely better.).  HENT: Positive for congestion and sinus pain. Negative for nosebleeds.   Respiratory: Positive for cough and shortness of breath.   Gastrointestinal: Negative for abdominal pain, blood in stool and melena.  Genitourinary: Negative for hematuria.  Musculoskeletal: Positive for joint pain (Routine aches and pains mostly knees). Negative for falls and neck pain.  Neurological: Negative for dizziness, focal weakness, weakness and headaches.  Psychiatric/Behavioral: Negative for depression and memory loss. The patient is not nervous/anxious and does not have insomnia.    I have  reviewed and (if needed) personally updated the patient's problem list, medications, allergies, past medical and surgical history, social and family history.   PAST MEDICAL HISTORY   Past Medical History:  Diagnosis Date  . Arthritis    intermittent  . CAD in native artery 1994, 2001   a) 2001 Exertional Angina --> Myoview: Inf infarct & Ant ischemia --> Cath: 95% pLAD, 99% D1, 80% RI & Cx-OM1&2 OK , & patent RCA stent --> CABG: b) Cath 2003: patent Grafts & stents; c) 07/2014: PCI-Ost LCx (not bypassed) - Xience DES 3.0 x 15 (3.6 mm); d) 11/16/2019 (NSTEMI) -> 100% RCA ISR/T (Res.Onyx DES 2.5 x 30 - 2.8 mm) & 80% ost LCx ISR - dLM-OstLCx (Res Onyx 3.5 x 15 - 3.8 mm)  . Childhood asthma   . Hemorrhoids    history of  . History of ST elevation myocardial infarction (STEMI) of inferior wall 1994; 1997   PCI of RCA - redo PCI BMS ML Vision 3.0 mm x 25 mm to RCA in 1997  . Hyperlipidemia LDL goal <70    At goal by Most recent labs October 2013: TC 123, TG 83, HDL 81, LDL 65  . Hypertension   . Ischemic cardiomyopathy 1994, 2003   Echo February 2014: EF 40-45%; moderate HK of anterolateral myocardium.; Grade 1 diastolic dysfunction  . Kidney stones    3 times had lithrotripsy  . Obesity (BMI 30.0-34.9)   . Pneumonia 2014  . S/P CABG x 3 2001   LIMA-LAD, SVG-D1l, SVG-(RI) - patent by Cath in 2003, RCA & LCx not Grafted    PAST SURGICAL HISTORY   Past Surgical History:  Procedure Laterality Date  . CARDIAC CATHETERIZATION  06/06/1999   2001 Exertional Angina --> Myoview: Inf infarct and Ant ischemia --> Cath: 95% pLAD, 99% D1, 80% RI & Cx-OM1&2 OK , & patent RCA stent --> CABG  . CARDIAC CATHETERIZATION  10/04/2001   Patent grafts with a 50% lesion in LAD beyond IMA insertion  . CARDIAC CATHETERIZATION N/A 07/31/2014   Procedure: Left Heart Cath and Cors/Grafts Angiography;  Surgeon: Jettie Booze, MD;  Location: [facility];  Service: Cardiovascular;  Laterality: N/A;  .  CARDIAC CATHETERIZATION N/A 07/31/2014   Procedure: Coronary Stent Intervention;  Surgeon: Jettie Booze, MD;  Location: [facility];  Service: Cardiovascular: PCI to Ostial Cx (not bypassed) - Xience DES 3.0 x 15 (3.6 mm)  . CAROTID DOPPLER  05/06/2012   Less than 50% diameter reduction of the Lft Subclavian.  . CORONARY ANGIOGRAPHY N/A 11/18/2019   Procedure: CORONARY ANGIOGRAPHY;  Surgeon: Troy Sine, MD;  Location: [facility];  Service: Cardiovascular; CONFIRMS p-mRCA stents (from 9/5); Stable Ostial Lcx 95% - for staged PCI; Ost LAD ~50% with diffuse diseaes (competetive flow from LIMA noted), RI 80%-100%, D1 100%.   . CORONARY ANGIOPLASTY WITH STENT PLACEMENT  01/21/1996   PTCA of RCA-95% lesion/per wife 2015  . CORONARY ARTERY BYPASS GRAFT  06/07/1999   x3. LIMA to distal LAD, SVG to first diag, and SVG to ramus  . CORONARY STENT INTERVENTION N/A 11/16/2019   Procedure: CORONARY STENT INTERVENTION;  Surgeon: Troy Sine, MD;  Location: [facility];  Service: Cardiovascular;; mRCA 100% In-stent thrombosis --> Resolute Onyx DES 2.5 x 30 (overlapping prior stent & covering lesion distal to prior stent) -> post-dilated to 2.8 mm  . CORONARY STENT INTERVENTION N/A 11/18/2019   Procedure: CORONARY STENT INTERVENTION;  Surgeon: Troy Sine, MD;  Location: [facility];  Service: Cardiovascular;; 95% ostial LCx (at proximal edge of prior stent) -> DES PCI from LM-ostLCx (overlapping prior stent in LCx) - Scoring Balloon PTCA followed by Resolute Onyx DES 3.5 x 15 mm - post-dilated 3.8 - 3.7 mm. (did not loose LAD flow)  . CORONARY/GRAFT ACUTE MI REVASCULARIZATION N/A 11/16/2019   Procedure: Coronary/Graft Acute MI Revascularization;  Surgeon: Troy Sine, MD;  Location: [facility];  Service: Cardiovascular;  --> PTCA-DES PCI mRCA 100% ISR/thrombosis  . HYDROCELE EXCISION Left 02/28/2016   Procedure: HYDROCELECTOMY ADULT;  Surgeon: Kathie Rhodes, MD;   Location: [facility];  Service: Urology;  Laterality: Left;  . LEFT HEART CATH AND CORS/GRAFTS ANGIOGRAPHY N/A 11/16/2019   Procedure: LEFT HEART CATH AND CORS/GRAFTS ANGIOGRAPHY;  Surgeon: Troy Sine, MD;  Location: [facility];  Service: Cardiovascular;; For ACS --> 100% p-mRCA In-stent Thrombosis & disease outside of stent; Ost LCx 95% at prox edge of prior stent, OM & LPL branches - OK.  Ost LAD ~50% diffuse Dz, competetive flow from LIMA: RI & D1 ~CTO; Patent LIMA-LAD, SVG-RI, SVG-D1  . LITHOTRIPSY  "2-3 times"   1988/2003/2007  . NM MYOVIEW LTD  08/19/2009   Moderafte perfusion seen in Basal inferoseptal, Basal inferior, Mid inferoseptal, Mid inferiorm, and Apical inferior regions. No ECG changes. EKG negative for ischemia.;   . TRANSTHORACIC ECHOCARDIOGRAM  05/06/2012   EF 40-45%, LV cavity moderately dilated, systolic function mild-moderately reduced, moderate hypokinesis of anterolatewral myocardium.  . TRANSTHORACIC ECHOCARDIOGRAM  11/15/2019   (ACS- 100% RCA-PCI, 95%LCx - staged PCI): EF 55-60%, ~no RWMA, GR1 DD.  ~ Normal Valves.    Immunization History  Administered Date(s) Administered  . Fluad Quad(high Dose 65+) 03/03/2020  . Influenza, High Dose Seasonal PF 12/02/2018  . Influenza,inj,Quad PF,6+ Mos 12/05/2017  . Pneumococcal Conjugate-13 05/30/2017  . Pneumococcal Polysaccharide-23 03/03/2019  . Tdap 06/01/2017    MEDICATIONS/ALLERGIES   Current Meds  Medication Sig  . albuterol (VENTOLIN HFA) 108 (90 Base) MCG/ACT inhaler Inhale 2 puffs into the lungs  every 6 (six) hours as needed for wheezing or shortness of breath.  . Evolocumab (REPATHA SURECLICK) 017 MG/ML SOAJ Inject 140 mg into the skin every 14 (fourteen) days.  . Magnesium Oxide 250 MG TABS Take 1 tablet by mouth daily.   . metoprolol tartrate (LOPRESSOR) 50 MG tablet TAKE ONE-HALF TABLET BY  MOUTH TWICE DAILY  . nitroGLYCERIN (NITROLINGUAL) 0.4 MG/SPRAY spray Place 1 spray under the tongue every 5  (five) minutes x 3 doses as needed for chest pain.  . predniSONE (DELTASONE) 5 MG tablet Take by mouth.  . TRELEGY ELLIPTA 100-62.5-25 MCG/INH AEPB Inhale 1 puff into the lungs daily.  . [DISCONTINUED] aspirin 81 MG chewable tablet Chew 1 tablet (81 mg total) by mouth daily.  . [DISCONTINUED] azithromycin (ZITHROMAX) 250 MG tablet Take 250 mg by mouth as directed.  . [DISCONTINUED] ramipril (ALTACE) 10 MG capsule TAKE 1 CAPSULE BY MOUTH  DAILY  . [DISCONTINUED] Tiotropium Bromide Monohydrate (SPIRIVA RESPIMAT) 1.25 MCG/ACT AERS Take 1 puff by mouth daily.  . [DISCONTINUED] varenicline (CHANTIX PAK) 0.5 MG X 11 & 1 MG X 42 tablet Take one 0.5 mg tablet by mouth once daily for 3 days, then increase to one 0.5 mg tablet twice daily for 4 days, then increase to one 1 mg tablet twice daily.  . [DISCONTINUED] varenicline (CHANTIX) 1 MG tablet Take 1 tablet (1 mg total) by mouth 2 (two) times daily.    Allergies  Allergen Reactions  . Brilinta [Ticagrelor] Shortness Of Breath    Having breathing issues  . Advicor [Niacin-Lovastatin Er] Other (See Comments)    Headache, possibly other reactions  . Lipitor [Atorvastatin] Other (See Comments)    Headache, possibly other reactions  . Pravachol [Pravastatin Sodium] Other (See Comments)    Headache, possibly other reactions  . Sulfa Antibiotics Other (See Comments)    Headache, possibly other reactions  . Rosuvastatin Other (See Comments)     40 MG  TABLET--EXTREME FATIGUE  , MUSCLE  PAIN     SOCIAL HISTORY/FAMILY HISTORY   Reviewed in Epic:  Pertinent findings:  Social History   Tobacco Use  . Smoking status: Current Every Day Smoker    Packs/day: 0.25    Years: 48.00    Pack years: 12.00    Types: Cigarettes  . Smokeless tobacco: Never Used  . Tobacco comment: Was not able to maintain smoking cessation, -no longer has Chantix available.  Substance Use Topics  . Alcohol use: No  . Drug use: No   Social History   Social History  Narrative   Married father of 2 with one grandchild. By his wife.   Continues to smoke one half pack a day.   Very physically active at work but no routine exercise.      No living will    Wife would make decisions for him if needed. Sons would be alternates   Would accept resuscitation   Doesn't want prolonged machines or intervention    OBJCTIVE -PE, EKG, labs   Wt Readings from Last 3 Encounters:  03/03/20 179 lb (81.2 kg)  02/26/20 179 lb 9.6 oz (81.5 kg)  11/28/19 188 lb 9.6 oz (85.5 kg)    Physical Exam: BP 136/76   Pulse 62   Ht 5\' 8"  (1.727 m)   Wt 179 lb 9.6 oz (81.5 kg)   BMI 27.31 kg/m  Physical Exam Vitals reviewed.  Constitutional:      General: He is not in acute distress.    Appearance: Normal  appearance. He is normal weight. He is not ill-appearing or toxic-appearing.     Comments: Notable weight loss.  Healthy-appearing.  Generalized ruddy complexion  HENT:     Head: Normocephalic and atraumatic.  Neck:     Vascular: No carotid bruit or JVD.  Cardiovascular:     Rate and Rhythm: Normal rate and regular rhythm.  No extrasystoles are present.    Chest Wall: PMI is not displaced.     Pulses: Intact distal pulses.     Heart sounds: Normal heart sounds. No murmur heard. No friction rub. No gallop. No S4 sounds.   Pulmonary:     Effort: Pulmonary effort is normal. No respiratory distress.     Comments: Mild residual sounds, no obvious wheezes rales or rhonchi. Chest:     Chest wall: No tenderness.  Abdominal:     General: Abdomen is flat. Bowel sounds are normal. There is no distension.     Palpations: Abdomen is soft.     Comments: No HSM or bruit  Musculoskeletal:        General: No swelling. Normal range of motion.     Cervical back: Normal range of motion and neck supple.  Neurological:     General: No focal deficit present.     Mental Status: He is alert and oriented to person, place, and time. Mental status is at baseline.  Psychiatric:         Mood and Affect: Mood normal.        Behavior: Behavior normal.        Thought Content: Thought content normal.        Judgment: Judgment normal.     Adult ECG Report N/a   Recent Labs: Reviewed -> Repatha is working Lab Results  Component Value Date   CHOL 143 03/03/2020   HDL 48.80 03/03/2020   LDLCALC 69 03/03/2020   LDLDIRECT 154.0 10/06/2019   TRIG 124.0 03/03/2020   CHOLHDL 3 03/03/2020   Lab Results  Component Value Date   CREATININE 1.00 03/03/2020   BUN 19 03/03/2020   NA 138 03/03/2020   K 4.7 03/03/2020   CL 101 03/03/2020   CO2 31 03/03/2020   No results found for: TSH  ASSESSMENT/PLAN    Problem List Items Addressed This Visit    CAD in native artery - status post CABG x3 after PCI x2 to the RCA (LIMA-LAD, SVG-diagonal SVG-RI - Primary (Chronic)    Now completely revascularized with graft to the ramus intermedius and LAD-diagonal as well as stents in the RCA and LCx.  No further anginal symptoms since most recent non-STEMI.  Plan: On DAPT x1 year (aspirin and Plavix)--was intolerant of Brilinta.  On low-dose beta-blocker-unable to titrate because of bradycardia.  Remains on Repatha-statin intolerant because of myopathy/myalgia  Is on ACE inhibitor which will convert to ARB because of cough  Smoking cessation counseling      Relevant Medications   valsartan (DIOVAN) 40 MG tablet   CAD- BMS PCI RCA ('97) - overlapping DES PCI for IST ('21); DES PCI Ost LCx ('16) - Overlapping DES LM-LCx for ISR ('21) (Chronic)    Now overlapping stents in both RCA and ostial LCx from Left Main into LCx.  Very important to remain on at a minimum Plavix essentially lifelong now.   Recommend uninterrupted DAPT x1 year into October 2022 (okay to hold aspirin for bleeding or bruising but otherwise restart once stable)      Relevant Medications   valsartan (DIOVAN)  40 MG tablet   Ischemic cardiomyopathy (Chronic)    EF resolved now back to normal.  No clinical signs  or symptoms of CHF.      Relevant Medications   valsartan (DIOVAN) 40 MG tablet   Hyperlipidemia with target LDL less than 70 (Chronic)    Labs checked at time of his MRI showed LDL within target zone of less than 70.  Has not been able to tolerate statin or any other medication including Zetia or Vascepa.      Relevant Medications   valsartan (DIOVAN) 40 MG tablet   Essential hypertension (Chronic)    Up-and-down blood pressures both at home and here.  He is on referral but having a cough.  Plan will be to convert to valsartan 40 mg daily and continue metoprolol at current dose.      Relevant Medications   valsartan (DIOVAN) 40 MG tablet   Statin myopathy (Chronic)    Has been intolerant of every type of statin you have tried.  Has had significant myopathies of myalgias.  Recently started on Repatha with appropriate control of lipids.      Cigarette smoker one half pack a day or less (Chronic)    He was doing really well on Chantix, unfortunately it is now off the market.  Will defer to PCP who is managing this, but may need to consider Wellbutrin or other options.  He is still close to finally being able to quit.  Encouraged his efforts.      Obesity (BMI 30.0-34.9) (Chronic)    Actually with weight loss, he is now below the "obesity" zone.      COPD (chronic obstructive pulmonary disease) ([place])    Follow-up with pulmonary medicine.  CT scan pending.      Relevant Medications   TRELEGY ELLIPTA 100-62.5-25 MCG/INH AEPB   predniSONE (DELTASONE) 5 MG tablet       COVID-19 Education: The signs and symptoms of COVID-19 were discussed with the patient and how to seek care for testing (follow up with PCP or arrange E-visit).   The importance of social distancing and COVID-19 vaccination was discussed today. The patient is practicing social distancing & Masking.   I spent a total of 35 minutes with the patient spent in direct patient consultation.  Additional time spent  with chart review  / charting (studies, outside notes, etc): 20 Total Time: 55 min   Current medicines are reviewed at length with the patient today.  (+/- concerns) n/a  This visit occurred during the SARS-CoV-2 public health emergency.  Safety protocols were in place, including screening questions prior to the visit, additional usage of staff PPE, and extensive cleaning of exam room while observing appropriate contact time as indicated for disinfecting solutions.  Notice: This dictation was prepared with Dragon dictation along with smaller phrase technology. Any transcriptional errors that result from this process are unintentional and may not be corrected upon review.  Patient Instructions / Medication Changes & Studies & Tests Ordered   Patient Instructions  Medication Instructions:  Restart Plavix 75 mg daily Stop Ramipril Start Valsartan 40 mg daily Continue all other medications *If you need a refill on your cardiac medications before your next appointment, please call your pharmacy*   Lab Work: None ordered   Testing/Procedures: None ordered   Follow-Up: At [facility], you and your health needs are our priority.  As part of our continuing mission to provide you with exceptional heart care, we have created designated Provider  Care Teams.  These Care Teams include your primary Cardiologist (physician) and Advanced Practice Providers (APPs -  Physician Assistants and Nurse Practitioners) who all work together to provide you with the care you need, when you need it.  We recommend signing up for the patient portal called "MyChart".  Sign up information is provided on this After Visit Summary.  MyChart is used to connect with patients for Virtual Visits (Telemedicine).  Patients are able to view lab/test results, encounter notes, upcoming appointments, etc.  Non-urgent messages can be sent to your provider as well.   To learn more about what you can do with MyChart, go to  [email or web address].    Your next appointment:  Monday 05/17/20 at 11:20 am   The format for your next appointment: Office     Provider:  Dr.Lacara Dunsworth      Studies Ordered:   No orders of the defined types were placed in this encounter.    Glenetta Hew, M.D., M.S. Interventional Cardiologist   Pager # [phone] Phone # [phone] [address] [place], [place] [address]   Thank you for choosing [facility] at [facility]!!

## 2020-03-02 ENCOUNTER — Ambulatory Visit
Admission: RE | Admit: 2020-03-02 | Discharge: 2020-03-02 | Disposition: A | Discharge status: Home / Self Care | Payer: Medicare Other | Source: Ambulatory Visit | Attending: Specialist | Admitting: Specialist

## 2020-03-02 ENCOUNTER — Other Ambulatory Visit: Payer: Self-pay

## 2020-03-02 DIAGNOSIS — J439 Emphysema, unspecified: Secondary | ICD-10-CM | POA: Insufficient documentation

## 2020-03-02 DIAGNOSIS — R0600 Dyspnea, unspecified: Secondary | ICD-10-CM | POA: Diagnosis not present

## 2020-03-02 DIAGNOSIS — R059 Cough, unspecified: Secondary | ICD-10-CM | POA: Insufficient documentation

## 2020-03-02 DIAGNOSIS — R0609 Other forms of dyspnea: Secondary | ICD-10-CM

## 2020-03-02 DIAGNOSIS — R0602 Shortness of breath: Secondary | ICD-10-CM | POA: Diagnosis not present

## 2020-03-03 ENCOUNTER — Encounter: Payer: Self-pay | Admitting: Internal Medicine

## 2020-03-03 ENCOUNTER — Ambulatory Visit (INDEPENDENT_AMBULATORY_CARE_PROVIDER_SITE_OTHER): Payer: Medicare Other | Admitting: Internal Medicine

## 2020-03-03 VITALS — BP 110/72 | HR 65 | Temp 98.0°F | Ht 66.5 in | Wt 179.0 lb

## 2020-03-03 DIAGNOSIS — I25119 Atherosclerotic heart disease of native coronary artery with unspecified angina pectoris: Secondary | ICD-10-CM

## 2020-03-03 DIAGNOSIS — I700 Atherosclerosis of aorta: Secondary | ICD-10-CM

## 2020-03-03 DIAGNOSIS — R1312 Dysphagia, oropharyngeal phase: Secondary | ICD-10-CM

## 2020-03-03 DIAGNOSIS — F39 Unspecified mood [affective] disorder: Secondary | ICD-10-CM

## 2020-03-03 DIAGNOSIS — Z0000 Encounter for general adult medical examination without abnormal findings: Secondary | ICD-10-CM | POA: Diagnosis not present

## 2020-03-03 DIAGNOSIS — J449 Chronic obstructive pulmonary disease, unspecified: Secondary | ICD-10-CM

## 2020-03-03 DIAGNOSIS — R1310 Dysphagia, unspecified: Secondary | ICD-10-CM | POA: Insufficient documentation

## 2020-03-03 DIAGNOSIS — Z125 Encounter for screening for malignant neoplasm of prostate: Secondary | ICD-10-CM | POA: Diagnosis not present

## 2020-03-03 DIAGNOSIS — Z23 Encounter for immunization: Secondary | ICD-10-CM | POA: Diagnosis not present

## 2020-03-03 LAB — COMPREHENSIVE METABOLIC PANEL
ALT: 15 U/L (ref 0–53)
AST: 13 U/L (ref 0–37)
Albumin: 4.2 g/dL (ref 3.5–5.2)
Alkaline Phosphatase: 64 U/L (ref 39–117)
BUN: 19 mg/dL (ref 6–23)
CO2: 31 mEq/L (ref 19–32)
Calcium: 9.9 mg/dL (ref 8.4–10.5)
Chloride: 101 mEq/L (ref 96–112)
Creatinine, Ser: 1 mg/dL (ref 0.40–1.50)
GFR: 77.26 mL/min (ref >60.00)
Glucose, Bld: 98 mg/dL (ref 70–99)
Potassium: 4.7 mEq/L (ref 3.5–5.1)
Sodium: 138 mEq/L (ref 135–145)
Total Bilirubin: 0.4 mg/dL (ref 0.2–1.2)
Total Protein: 6.8 g/dL (ref 6.0–8.3)

## 2020-03-03 LAB — LIPID PANEL
Cholesterol: 143 mg/dL (ref 0–200)
HDL: 48.8 mg/dL (ref >39.00)
LDL Cholesterol: 69 mg/dL (ref 0–99)
NonHDL: 93.9
Total CHOL/HDL Ratio: 3
Triglycerides: 124 mg/dL (ref 0.0–149.0)
VLDL: 24.8 mg/dL (ref 0.0–40.0)

## 2020-03-03 LAB — CBC
HCT: 51.7 % (ref 39.0–52.0)
Hemoglobin: 17.4 g/dL — ABNORMAL HIGH (ref 13.0–17.0)
MCHC: 33.7 g/dL (ref 30.0–36.0)
MCV: 93.1 fl (ref 78.0–100.0)
Platelets: 253 10*3/uL (ref 150.0–400.0)
RBC: 5.55 Mil/uL (ref 4.22–5.81)
RDW: 14.4 % (ref 11.5–15.5)
WBC: 8.9 10*3/uL (ref 4.0–10.5)

## 2020-03-03 LAB — PSA, MEDICARE: PSA: 0.66 ng/ml (ref 0.10–4.00)

## 2020-03-03 MED ORDER — BUPROPION HCL ER (SR) 150 MG PO TB12: 150.0000 mg | ORAL_TABLET | Freq: Two times a day (BID) | ORAL | 11 refills | Status: DC

## 2020-03-03 MED ORDER — PANTOPRAZOLE SODIUM 40 MG PO TBEC: 40.0000 mg | DELAYED_RELEASE_TABLET | Freq: Every day | ORAL | 3 refills | Status: DC

## 2020-03-03 NOTE — Addendum Note (Signed)
Addended by: Pilar Grammes on: 03/03/2020 12:18 PM   Modules accepted: Orders

## 2020-03-03 NOTE — Progress Notes (Signed)
Subjective:    Patient ID: Benjamin Robertson, male    DOB: Jul 27, 1950, 70 y.o.   MRN: [id number]  HPI Here for Medicare wellness visit and follow up of chronic health conditions This visit occurred during the SARS-CoV-2 public health emergency.  Safety protocols were in place, including screening questions prior to the visit, additional usage of staff PPE, and extensive cleaning of exam room while observing appropriate contact time as indicated for disinfecting solutions.   Reviewed form and advanced directives Reviewed other doctors Still smoking---discussed. Now down to 3-4 per day No alcohol No set exercise--busy on land (blowing leaves, wood, etc) Vision is okay Hearing fair No falls Lots "going on this year"---people sick, etc. Periodic depressed mood. Feels better if he gets out to work--but limited with breathing. Not anhedonic Independent with instrumental ADLs No sig memory issues  Was hospitalized with MI Needed 4 stents No chest pain now Changed BP med to valsartan---cough better No edema No palpitations On repatha---he does at home BP will drop if he exerts too much  Ongoing DOE---working with Dr Raul Del On trelegy for this He feels his "airway gets stopped up"---like in night Uses albuterol prn--will help and he brings up mucus Some wheezing. 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 18 g 1   clopidogrel (PLAVIX) 75 MG tablet Take 1 tablet (75 mg total) by mouth daily. 90 tablet 3   Evolocumab (REPATHA SURECLICK) 160 MG/ML SOAJ Inject 140 mg into the skin every 14 (fourteen)  days. 2 mL 11   Magnesium Oxide 250 MG TABS Take 1 tablet by mouth daily.      metoprolol tartrate (LOPRESSOR) 50 MG tablet TAKE ONE-HALF TABLET BY  MOUTH TWICE DAILY 90 tablet 3   nitroGLYCERIN (NITROLINGUAL) 0.4 MG/SPRAY spray Place 1 spray under the tongue every 5 (five) minutes x 3 doses as needed for chest pain. 4.9 g 2   predniSONE (DELTASONE) 5 MG tablet Take by mouth.     TRELEGY ELLIPTA 100-62.5-25 MCG/INH AEPB Inhale 1 puff into the lungs daily.     valsartan (DIOVAN) 40 MG tablet Take 40 mg daily after breakfast 90 tablet 3   No current facility-administered medications on file prior to visit.    Allergies  Allergen Reactions   Brilinta [Ticagrelor] Shortness Of Breath    Having breathing issues   Advicor [Niacin-Lovastatin Er] Other (See Comments)    Headache, possibly other reactions   Lipitor [Atorvastatin] Other (See Comments)    Headache, possibly other reactions   Pravachol [Pravastatin Sodium] Other (See Comments)    Headache, possibly other reactions   Sulfa Antibiotics Other (See Comments)    Headache, possibly other reactions   Rosuvastatin Other (See Comments)     40 MG  TABLET--EXTREME FATIGUE  , MUSCLE  PAIN     Past Medical History:  Diagnosis Date   Arthritis    intermittent   CAD in native artery 1994, 2001   a) 2001 Exertional Angina --> Myoview: Inf infarct and Ant ischemia --> Cath: 95% pLAD, 99% D1, 80% RI & Cx-OM1&2 OK , & patent RCA stent --> CABG: b) Cath  2003: patent grafts, RCA & Cx; c) 07/2014: PCI-Ost LCx (not bypassed) - Xience DES 3.0 x 15 (3.6 mm); d)    Childhood asthma    Hemorrhoids    history of   History of ST elevation myocardial infarction (STEMI) of inferior wall 1994; 1997   PCI of RCA - redo PCI BMS ML Vision 3.0 mm x 25 mm to RCA in 1997   Hyperlipidemia LDL goal <70    At goal by Most recent labs October 2013: TC 123, TG 83, HDL 81, LDL 65   Hypertension    Ischemic cardiomyopathy 1994, 2003   Echo  February 2014: EF 40-45%; moderate HK of anterolateral myocardium.; Grade 1 diastolic dysfunction   Kidney stones    3 times had lithrotripsy   Obesity (BMI 30.0-34.9)    Pneumonia 2014   S/P CABG x 3 2001   LIMA-LAD, SVG-diagonal, SVG-Ramus Intermedius (RI) - patent by Cath in 2003    Past Surgical History:  Procedure Laterality Date   CARDIAC CATHETERIZATION  06/06/1999   2001 Exertional Angina --> Myoview: Inf infarct and Ant ischemia --> Cath: 95% pLAD, 99% D1, 80% RI & Cx-OM1&2 OK , & patent RCA stent --> CABG   CARDIAC CATHETERIZATION  10/04/2001   Patent grafts with a 50% lesion in LAD beyond IMA insertion   CARDIAC CATHETERIZATION N/A 07/31/2014   Procedure: Left Heart Cath and Cors/Grafts Angiography;  Surgeon: Corky Crafts, MD;  Location: [facility] INVASIVE CV LAB;  Service: Cardiovascular;  Laterality: N/A;   CARDIAC CATHETERIZATION N/A 07/31/2014   Procedure: Coronary Stent Intervention;  Surgeon: Corky Crafts, MD;  Location: [facility] INVASIVE CV LAB;  Service: Cardiovascular: PCI to Ostial Cx (not bypassed) - Xience DES 3.0 x 15 (3.6 mm)   CAROTID DOPPLER  05/06/2012   Less than 50% diameter reduction of the Lft Subclavian.   CORONARY ANGIOGRAPHY N/A 11/18/2019   Procedure: CORONARY ANGIOGRAPHY;  Surgeon: Lennette Bihari, MD;  Location: [facility] INVASIVE CV LAB;  Service: Cardiovascular; CONFIRMS p-mRCA stents (from 9/5); Stable Ostial Lcx 95% - for staged PCI; Ost LAD ~50% with diffuse diseaes (competetive flow from LIMA noted), RI 80%-100%, D1 100%.    CORONARY ANGIOPLASTY WITH STENT PLACEMENT  01/21/1996   PTCA of RCA-95% lesion/per wife 2015   CORONARY ARTERY BYPASS GRAFT  06/07/1999   x3. LIMA to distal LAD, SVG to first diag, and SVG to ramus   CORONARY STENT INTERVENTION N/A 11/16/2019   Procedure: CORONARY STENT INTERVENTION;  Surgeon: Lennette Bihari, MD;  Location: [facility] INVASIVE CV LAB;  Service: Cardiovascular;; mRCA 100% In-stent thrombosis --> Resolute Onyx DES 2.5  x 30 (overlapping prior stent & covering lesion distal to prior stent) -> post-dilated to 2.75 mm   CORONARY STENT INTERVENTION N/A 11/18/2019   Procedure: CORONARY STENT INTERVENTION;  Surgeon: Lennette Bihari, MD;  Location: [facility] INVASIVE CV LAB;  Service: Cardiovascular;; 95% ostial LCx (at proximal edge of prior stent) -> DES PCI from pLM-ostLCx (overlapping prior stent in LCx) - Scoring Balloon PTCA followed by Resolute Onyx DES 3.5 x 15 mm - post-dilated 3.8 - 3.7 mm. (did not loos LAD flow)   CORONARY/GRAFT ACUTE MI REVASCULARIZATION N/A 11/16/2019   Procedure: Coronary/Graft Acute MI Revascularization;  Surgeon: Lennette Bihari, MD;  Location: [facility] INVASIVE CV LAB;  Service: Cardiovascular;  Laterality: N/A;   HYDROCELE EXCISION Left 02/28/2016   Procedure: HYDROCELECTOMY ADULT;  Surgeon: Ihor Gully, MD;  Location: [facility] ORS;  Service: Urology;  Laterality: Left;  LEFT HEART CATH AND CORS/GRAFTS ANGIOGRAPHY N/A 11/16/2019   Procedure: LEFT HEART CATH AND CORS/GRAFTS ANGIOGRAPHY;  Surgeon: Troy Sine, MD;  Location: [place] CV LAB;  Service: Cardiovascular;; For ACS --> 100% p-mRCA In-stent Thrombosis & disease outside of stent; Ost LCx 95% at prox edge of prior stent, OM & LPL branches - OK.  Ost LAD ~50% diffuse Dz, competetive flow from LIMA: RI & D1 ~CTO; Patent LIMA-LAD, SVG-RI, SVG-D1   LITHOTRIPSY  "2-3 times"   1988/2003/2007   NM MYOVIEW LTD  08/19/2009   Moderafte perfusion seen in Basal inferoseptal, Basal inferior, Mid inferoseptal, Mid inferiorm, and Apical inferior regions. No ECG changes. EKG negative for ischemia.;    TRANSTHORACIC ECHOCARDIOGRAM  05/06/2012   EF 40-45%, LV cavity moderately dilated, systolic function mild-moderately reduced, moderate hypokinesis of anterolatewral myocardium.   TRANSTHORACIC ECHOCARDIOGRAM  11/16/2019   (ACS- 100% RCA-PCI, 95%LCx - staged PCI): EF 55-60%, ~no RWMA, GR1 DD.  ~ Normal Valves.    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 By his wife.   Continues to smoke one half pack a day.   Very physically active at work but no routine exercise.      No living will    Wife would make decisions for him if needed. Sons would be alternates   Would accept resuscitation   Doesn't want prolonged machines or intervention   Social Determinants of Health   Financial Resource Strain: Not on file  Food Insecurity: Not on file  Transportation Needs: Not on file  Physical Activity: Not on file  Stress: Not on file  Social Connections: Not on file  Intimate Partner Violence: Not on file   Review of Systems Appetite is not great Has lost weight---30# since last MI Sleeps okay if doesn't get the bronchial congestion Wears seat belt Teeth okay--overdue for dentist No suspicious skin lesions--just dry skin No heartburn. Some dysphagia---"strangled" at times Bowels are fine--no blood Voids okay--flow is fine No sig back or joint pain---just gets some muscle cramps    Objective:   Physical Exam Constitutional:      Appearance: Normal appearance.  HENT:     Mouth/Throat:     Comments: No lesions Eyes:      Conjunctiva/sclera: Conjunctivae normal.     Pupils: Pupils are equal, round, and reactive to light.     Comments: Ectropion on right  Cardiovascular:     Rate and Rhythm: Normal rate and regular rhythm.  Pulses: Normal pulses.     Heart sounds: No murmur heard. No gallop.   Pulmonary:     Effort: Pulmonary effort is normal.     Breath sounds: No wheezing or rales.     Comments: Decreased breath sounds Not tight Some rhonchi on right Abdominal:     Palpations: Abdomen is soft.     Tenderness: There is no abdominal tenderness.  Musculoskeletal:     Cervical back: Neck supple.     Right lower leg: No edema.     Left lower leg: No edema.  Lymphadenopathy:     Cervical: No cervical adenopathy.  Skin:    General: Skin is warm.     Findings: No rash.  Neurological:     Mental Status: He is alert and oriented to person, place, and time.     Comments: President--- "Biden, Trump, the black man" 100-92-? D-l-r-o-w Recall 2/3  Psychiatric:        Behavior: Behavior normal.     Comments: Mild depressed mood            Assessment & Plan:

## 2020-03-03 NOTE — Assessment & Plan Note (Signed)
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

## 2020-03-03 NOTE — Assessment & Plan Note (Signed)
No angina since stents On repatha, plavix, ARB

## 2020-03-03 NOTE — Assessment & Plan Note (Signed)
Sounds like acid reflux Night symptoms could be this vs bronchial--as well Trial omeprazole

## 2020-03-03 NOTE — Assessment & Plan Note (Signed)
On CT scan Is on the repatha

## 2020-03-03 NOTE — Assessment & Plan Note (Signed)
I have personally reviewed the Medicare Annual Wellness questionnaire and have noted 1. The patient's medical and social history 2. Their use of alcohol, tobacco or illicit drugs 3. Their current medications and supplements 4. The patient's functional ability including ADL's, fall risks, home safety risks and hearing or visual             impairment. 5. Diet and physical activities 6. Evidence for depression or mood disorders  The patients weight, height, BMI and visual acuity have been recorded in the chart I have made referrals, counseling and provided education to the patient based review of the above and I have provided the pt with a written personalized care plan for preventive services.  I have provided you with a copy of your personalized plan for preventive services. Please take the time to review along with your updated medication list.  Will check PSA Consider colon again 2029 Not able to exercise--pulmonary Told by Dr Raul Del not to take COVID vaccine?????? Flu vaccine today

## 2020-03-03 NOTE — Assessment & Plan Note (Signed)
Has had depression since last heart attack Daily symptoms Will try bupropion (hopefully help him quit the last of cigarettes as well) See back 1 month

## 2020-03-14 ENCOUNTER — Encounter: Payer: Self-pay | Admitting: Cardiology

## 2020-03-14 DIAGNOSIS — G720 Drug-induced myopathy: Secondary | ICD-10-CM | POA: Insufficient documentation

## 2020-03-14 NOTE — Assessment & Plan Note (Signed)
He was doing really well on Chantix, unfortunately it is now off the market.  Will defer to PCP who is managing this, but may need to consider Wellbutrin or other options.  He is still close to finally being able to quit.  Encouraged his efforts.

## 2020-03-14 NOTE — Assessment & Plan Note (Addendum)
EF resolved now back to normal.  No clinical signs or symptoms of CHF.

## 2020-03-14 NOTE — Assessment & Plan Note (Signed)
Actually with weight loss, he is now below the "obesity" zone.

## 2020-03-14 NOTE — Assessment & Plan Note (Signed)
Labs checked at time of his MRI showed LDL within target zone of less than 70.  Has not been able to tolerate statin or any other medication including Zetia or Vascepa.

## 2020-03-14 NOTE — Assessment & Plan Note (Signed)
Has been intolerant of every type of statin you have tried.  Has had significant myopathies of myalgias.  Recently started on Repatha with appropriate control of lipids.

## 2020-03-14 NOTE — Assessment & Plan Note (Signed)
Follow-up with pulmonary medicine.  CT scan pending.

## 2020-03-14 NOTE — Assessment & Plan Note (Signed)
Now overlapping stents in both RCA and ostial LCx from Left Main into LCx.  Very important to remain on at a minimum Plavix essentially lifelong now.   Recommend uninterrupted DAPT x1 year into October 2022 (okay to hold aspirin for bleeding or bruising but otherwise restart once stable)

## 2020-03-14 NOTE — Assessment & Plan Note (Signed)
Now completely revascularized with graft to the ramus intermedius and LAD-diagonal as well as stents in the RCA and LCx.  No further anginal symptoms since most recent non-STEMI.  Plan: On DAPT x1 year (aspirin and Plavix)--was intolerant of Brilinta.  On low-dose beta-blocker-unable to titrate because of bradycardia.  Remains on Repatha-statin intolerant because of myopathy/myalgia  Is on ACE inhibitor which will convert to ARB because of cough  Smoking cessation counseling

## 2020-03-14 NOTE — Assessment & Plan Note (Signed)
Up-and-down blood pressures both at home and here.  He is on referral but having a cough.  Plan will be to convert to valsartan 40 mg daily and continue metoprolol at current dose.

## 2020-03-17 DIAGNOSIS — J439 Emphysema, unspecified: Secondary | ICD-10-CM | POA: Diagnosis not present

## 2020-03-17 DIAGNOSIS — R0600 Dyspnea, unspecified: Secondary | ICD-10-CM | POA: Diagnosis not present

## 2020-03-17 DIAGNOSIS — R059 Cough, unspecified: Secondary | ICD-10-CM | POA: Diagnosis not present

## 2020-03-25 ENCOUNTER — Other Ambulatory Visit: Payer: Self-pay | Admitting: Internal Medicine

## 2020-04-06 ENCOUNTER — Encounter: Payer: Self-pay | Admitting: Internal Medicine

## 2020-04-06 ENCOUNTER — Ambulatory Visit (INDEPENDENT_AMBULATORY_CARE_PROVIDER_SITE_OTHER): Payer: Medicare Other | Admitting: Internal Medicine

## 2020-04-06 ENCOUNTER — Other Ambulatory Visit: Payer: Self-pay

## 2020-04-06 DIAGNOSIS — F39 Unspecified mood [affective] disorder: Secondary | ICD-10-CM | POA: Diagnosis not present

## 2020-04-06 MED ORDER — DULOXETINE HCL 30 MG PO CPEP: 30.0000 mg | ORAL_CAPSULE | Freq: Every day | ORAL | 3 refills | Status: DC

## 2020-04-06 NOTE — Patient Instructions (Signed)
Please try the duloxetine 30mg  daily to help your depression. Let me know if you have any problems taking it.

## 2020-04-06 NOTE — Assessment & Plan Note (Signed)
Ongoing depression since MI Daily symptoms but not severe ---?mild MDD vs reactive with the MI Didn't tolerate bupropion Will try low dose duloxetine

## 2020-04-06 NOTE — Progress Notes (Signed)
Subjective:    Patient ID: Benjamin Robertson, male    DOB: Aug 13, 1950, 70 y.o.   MRN: [id number]  HPI  Here for follow up of mood issues and dysphagia This visit occurred during the SARS-CoV-2 public health emergency.  Safety protocols were in place, including screening questions prior to the visit, additional usage of staff PPE, and extensive cleaning of exam room while observing appropriate contact time as indicated for disinfecting solutions.   Only took the bupropion for a short time It made his mouth and tongue dry Still having depression since last heart attack Hasn't been leaving the house Some thoughts of dying--but no suicidal ideation His breathing is a big part of this  Has cut back further on cigarettes Only 1 pack in past 2 weeks Some mucus Going back to Dr Raul Del next month  Pantoprazole did help his swallowing  Current Outpatient Medications on File Prior to Visit  Medication Sig Dispense Refill  . albuterol (VENTOLIN HFA) 108 (90 Base) MCG/ACT inhaler Inhale 2 puffs into the lungs every 6 (six) hours as needed for wheezing or shortness of breath. 18 g 1  . clopidogrel (PLAVIX) 75 MG tablet Take 1 tablet (75 mg total) by mouth daily. 90 tablet 3  . Evolocumab (REPATHA SURECLICK) 419 MG/ML SOAJ Inject 140 mg into the skin every 14 (fourteen) days. 2 mL 11  . Magnesium Oxide 250 MG TABS Take 1 tablet by mouth daily.     . metoprolol tartrate (LOPRESSOR) 50 MG tablet TAKE ONE-HALF TABLET BY  MOUTH TWICE DAILY 90 tablet 3  . nitroGLYCERIN (NITROLINGUAL) 0.4 MG/SPRAY spray Place 1 spray under the tongue every 5 (five) minutes x 3 doses as needed for chest pain. 4.9 g 2  . pantoprazole (PROTONIX) 40 MG tablet Take 1 tablet (40 mg total) by mouth daily. 30 tablet 3  . predniSONE (DELTASONE) 5 MG tablet Take by mouth.    . TRELEGY ELLIPTA 100-62.5-25 MCG/INH AEPB Inhale 1 puff into the lungs daily.    . valsartan (DIOVAN) 40 MG tablet Take 40 mg daily after breakfast 90 tablet 3    No current facility-administered medications on file prior to visit.    Allergies  Allergen Reactions  . Brilinta [Ticagrelor] Shortness Of Breath    Having breathing issues  . Advicor [Niacin-Lovastatin Er] Other (See Comments)    Headache, possibly other reactions  . Lipitor [Atorvastatin] Other (See Comments)    Headache, possibly other reactions  . Pravachol [Pravastatin Sodium] Other (See Comments)    Headache, possibly other reactions  . Sulfa Antibiotics Other (See Comments)    Headache, possibly other reactions  . Rosuvastatin Other (See Comments)     40 MG  TABLET--EXTREME FATIGUE  , MUSCLE  PAIN     Past Medical History:  Diagnosis Date  . Arthritis    intermittent  . CAD in native artery 1994, 2001   a) 2001 Exertional Angina --> Myoview: Inf infarct & Ant ischemia --> Cath: 95% pLAD, 99% D1, 80% RI & Cx-OM1&2 OK , & patent RCA stent --> CABG: b) Cath 2003: patent Grafts & stents; c) 07/2014: PCI-Ost LCx (not bypassed) - Xience DES 3.0 x 15 (3.6 mm); d) 11/16/2019 (NSTEMI) -> 100% RCA ISR/T (Res.Onyx DES 2.5 x 30 - 2.8 mm) & 80% ost LCx ISR - dLM-OstLCx (Res Onyx 3.5 x 15 - 3.8 mm)  . Childhood asthma   . Hemorrhoids    history of  . History of ST elevation myocardial infarction (STEMI)  of inferior wall 1994; 1997   PCI of RCA - redo PCI BMS ML Vision 3.0 mm x 25 mm to RCA in 1997  . Hyperlipidemia LDL goal <70    At goal by Most recent labs October 2013: TC 123, TG 83, HDL 81, LDL 65  . Hypertension   . Ischemic cardiomyopathy 1994, 2003   Echo February 2014: EF 40-45%; moderate HK of anterolateral myocardium.; Grade 1 diastolic dysfunction  . Kidney stones    3 times had lithrotripsy  . Obesity (BMI 30.0-34.9)   . Pneumonia 2014  . S/P CABG x 3 2001   LIMA-LAD, SVG-D1l, SVG-(RI) - patent by Cath in 2003, RCA & LCx not Grafted    Past Surgical History:  Procedure Laterality Date  . CARDIAC CATHETERIZATION  06/06/1999   2001 Exertional Angina --> Myoview: Inf  infarct and Ant ischemia --> Cath: 95% pLAD, 99% D1, 80% RI & Cx-OM1&2 OK , & patent RCA stent --> CABG  . CARDIAC CATHETERIZATION  10/04/2001   Patent grafts with a 50% lesion in LAD beyond IMA insertion  . CARDIAC CATHETERIZATION N/A 07/31/2014   Procedure: Left Heart Cath and Cors/Grafts Angiography;  Surgeon: Jettie Booze, MD;  Location: [facility];  Service: Cardiovascular;  Laterality: N/A;  . CARDIAC CATHETERIZATION N/A 07/31/2014   Procedure: Coronary Stent Intervention;  Surgeon: Jettie Booze, MD;  Location: [facility];  Service: Cardiovascular: PCI to Ostial Cx (not bypassed) - Xience DES 3.0 x 15 (3.6 mm)  . CAROTID DOPPLER  05/06/2012   Less than 50% diameter reduction of the Lft Subclavian.  . CORONARY ANGIOGRAPHY N/A 11/18/2019   Procedure: CORONARY ANGIOGRAPHY;  Surgeon: Troy Sine, MD;  Location: [facility];  Service: Cardiovascular; CONFIRMS p-mRCA stents (from 9/5); Stable Ostial Lcx 95% - for staged PCI; Ost LAD ~50% with diffuse diseaes (competetive flow from LIMA noted), RI 80%-100%, D1 100%.   . CORONARY ANGIOPLASTY WITH STENT PLACEMENT  01/21/1996   PTCA of RCA-95% lesion/per wife 2015  . CORONARY ARTERY BYPASS GRAFT  06/07/1999   x3. LIMA to distal LAD, SVG to first diag, and SVG to ramus  . CORONARY STENT INTERVENTION N/A 11/16/2019   Procedure: CORONARY STENT INTERVENTION;  Surgeon: Troy Sine, MD;  Location: [facility];  Service: Cardiovascular;; mRCA 100% In-stent thrombosis --> Resolute Onyx DES 2.5 x 30 (overlapping prior stent & covering lesion distal to prior stent) -> post-dilated to 2.8 mm  . CORONARY STENT INTERVENTION N/A 11/18/2019   Procedure: CORONARY STENT INTERVENTION;  Surgeon: Troy Sine, MD;  Location: [facility];  Service: Cardiovascular;; 95% ostial LCx (at proximal edge of prior stent) -> DES PCI from LM-ostLCx (overlapping prior stent in LCx) - Scoring Balloon PTCA followed by Resolute Onyx DES  3.5 x 15 mm - post-dilated 3.8 - 3.7 mm. (did not loose LAD flow)  . CORONARY/GRAFT ACUTE MI REVASCULARIZATION N/A 11/16/2019   Procedure: Coronary/Graft Acute MI Revascularization;  Surgeon: Troy Sine, MD;  Location: [facility] [place] [facility];  Service: Cardiovascular;  --> PTCA-DES PCI mRCA 100% ISR/thrombosis  . HYDROCELE EXCISION Left 02/28/2016   Procedure: HYDROCELECTOMY ADULT;  Surgeon: Kathie Rhodes, MD;  Location: [facility];  Service: Urology;  Laterality: Left;  . LEFT HEART CATH AND CORS/GRAFTS ANGIOGRAPHY N/A 11/16/2019   Procedure: LEFT HEART CATH AND CORS/GRAFTS ANGIOGRAPHY;  Surgeon: Troy Sine, MD;  Location: [facility];  Service: Cardiovascular;; For ACS --> 100% p-mRCA  In-stent Thrombosis & disease outside of stent; Ost LCx 95% at prox edge of prior stent, OM & LPL branches - OK.  Ost LAD ~50% diffuse Dz, competetive flow from LIMA: RI & D1 ~CTO; Patent LIMA-LAD, SVG-RI, SVG-D1  . LITHOTRIPSY  "2-3 times"   1988/2003/2007  . NM MYOVIEW LTD  08/19/2009   Moderafte perfusion seen in Basal inferoseptal, Basal inferior, Mid inferoseptal, Mid inferiorm, and Apical inferior regions. No ECG changes. EKG negative for ischemia.;   . TRANSTHORACIC ECHOCARDIOGRAM  05/06/2012   EF 40-45%, LV cavity moderately dilated, systolic function mild-moderately reduced, moderate hypokinesis of anterolatewral myocardium.  . TRANSTHORACIC ECHOCARDIOGRAM  11/15/2019   (ACS- 100% RCA-PCI, 95%LCx - staged PCI): EF 55-60%, ~no RWMA, GR1 DD.  ~ Normal Valves.    Family History  Problem Relation Age of Onset  . Heart attack Mother   . Heart disease Mother   . Stroke Sister        during cancer Rx  . Lymphoma Sister   . Lung cancer Sister     Social History   Socioeconomic History  . Marital status: Married    Spouse name: Not on file  . Number of children: 2  . Years of education: Not on file  . Highest education level: Not on file  Occupational History  . 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 Smoking status: Current Every Day Smoker    Packs/day: 0.25    Years: 48.00    Pack years: 12.00    Types: Cigarettes  . Smokeless tobacco: Never Used  . Tobacco comment: Was not able to maintain smoking cessation, -no longer has Chantix available.  Substance and Sexual Activity  . Alcohol use: No  . Drug use: No  . Sexual activity: Never  Other Topics Concern  . Not on file  Social History Narrative   Married father of 2 with one grandchild. By his wife.   Continues to smoke one half pack a day.   Very physically active at work but no routine exercise.      No living will    Wife would make decisions for him if needed. Sons would be alternates   Would accept resuscitation   Doesn't want prolonged machines or intervention   Social Determinants of Health   Financial Resource Strain: Not on file  Food Insecurity: Not on file  Transportation Needs: Not on file  Physical Activity: Not on file  Stress: Not on file  Social Connections: Not on file  Intimate Partner Violence: Not on file   Review of Systems Got shot of repatha---thinks it affected his breathing Thinks he got skin burn on left buttock--had to sleep in recliner for 2-3 weeks because of breathing    Objective:   Physical Exam Constitutional:      Appearance: Normal appearance.  Skin:    Comments: No visible lesions on buttocks where his symptoms are--reassured  Neurological:     Mental Status: He is alert.            Assessment & Plan:

## 2020-04-07 ENCOUNTER — Telehealth: Payer: Self-pay | Admitting: Cardiology

## 2020-04-07 NOTE — Telephone Encounter (Signed)
Returned a call to pt's wife they stated that he is having trouble breathing and swallowing with repatha. They need to speak to a pharmacist to assist with med reaction issues and decide how they would like to proceed. Will route to pharmd pool

## 2020-04-07 NOTE — Telephone Encounter (Signed)
New message:     Patient wife calling stating that patient is have some issues with Repatha sur injection 140 mg-ml. Patient wife need to speak with a nurse. Patient had a shot on the 04/03/20 patient have some issues.

## 2020-04-07 NOTE — Telephone Encounter (Signed)
Spoke with wife.  Patient has been having ongoing shortness of breath and tightness in his chest off/on since fall.  Started Repatha in August, shortly before hospitalization for ACS and DES to RCA.  Had previous CABG.  She states he has been seeing pulmonology at [facility].  States gave most recent Repatha injection 1/22 and he has been feeling worse since.  Advised that they need to call 911 if they feel his breathing is compromised.  He should stop using the Repatha for now.  We will reach out to him in 2-3 weeks to see if breathing has improved any.  Wife voiced understanding.

## 2020-04-28 ENCOUNTER — Other Ambulatory Visit: Payer: Self-pay | Admitting: Internal Medicine

## 2020-04-28 DIAGNOSIS — R634 Abnormal weight loss: Secondary | ICD-10-CM | POA: Diagnosis not present

## 2020-04-28 DIAGNOSIS — J310 Chronic rhinitis: Secondary | ICD-10-CM | POA: Diagnosis not present

## 2020-04-28 DIAGNOSIS — R059 Cough, unspecified: Secondary | ICD-10-CM | POA: Diagnosis not present

## 2020-04-28 DIAGNOSIS — R5383 Other fatigue: Secondary | ICD-10-CM | POA: Diagnosis not present

## 2020-04-28 DIAGNOSIS — J439 Emphysema, unspecified: Secondary | ICD-10-CM | POA: Diagnosis not present

## 2020-05-05 ENCOUNTER — Other Ambulatory Visit: Payer: Self-pay | Admitting: Internal Medicine

## 2020-05-07 ENCOUNTER — Encounter: Payer: Self-pay | Admitting: Internal Medicine

## 2020-05-07 ENCOUNTER — Ambulatory Visit (INDEPENDENT_AMBULATORY_CARE_PROVIDER_SITE_OTHER): Payer: Medicare Other | Admitting: Internal Medicine

## 2020-05-07 ENCOUNTER — Other Ambulatory Visit: Payer: Self-pay

## 2020-05-07 DIAGNOSIS — F39 Unspecified mood [affective] disorder: Secondary | ICD-10-CM

## 2020-05-07 DIAGNOSIS — J449 Chronic obstructive pulmonary disease, unspecified: Secondary | ICD-10-CM

## 2020-05-07 DIAGNOSIS — E441 Mild protein-calorie malnutrition: Secondary | ICD-10-CM | POA: Diagnosis not present

## 2020-05-07 MED ORDER — PANTOPRAZOLE SODIUM 40 MG PO TBEC: 40.0000 mg | DELAYED_RELEASE_TABLET | Freq: Every day | ORAL | 3 refills | Status: AC

## 2020-05-07 MED ORDER — DULOXETINE HCL 60 MG PO CPEP: 60.0000 mg | ORAL_CAPSULE | Freq: Every day | ORAL | 3 refills | Status: AC

## 2020-05-07 NOTE — Patient Instructions (Signed)
Please eat 3 regular meals a day with high calorie density foods like peanut butter, cheese, etc. Try boost or ensure----mixed with ice cream if the taste isn't good---probably 2 cans a day if you can.

## 2020-05-07 NOTE — Progress Notes (Signed)
Subjective:    Patient ID: Benjamin Robertson, male    DOB: November 01, 1950, 70 y.o.   MRN: [id number]  HPI Here with wife for follow up of depressed mood This visit occurred during the SARS-CoV-2 public health emergency.  Safety protocols were in place, including screening questions prior to the visit, additional usage of staff PPE, and extensive cleaning of exam room while observing appropriate contact time as indicated for disinfecting solutions.   Has tolerated the duloxetine He feels it helps him during the day No thoughts about dying now Still very limited due to SOB--so can't do things Ongoing mucus production--stops his airways up in the morning (upon arising) Has to do a clearing cough--very tough mucus mucinex some help Has to talks slower--gets SOB No energy at all  Some dizziness Has been losing weight Has lost muscle mass  Current Outpatient Medications on File Prior to Visit  Medication Sig Dispense Refill  . albuterol (VENTOLIN HFA) 108 (90 Base) MCG/ACT inhaler Inhale 2 puffs into the lungs every 6 (six) hours as needed for wheezing or shortness of breath. 18 g 1  . clopidogrel (PLAVIX) 75 MG tablet Take 1 tablet (75 mg total) by mouth daily. 90 tablet 3  . DULoxetine (CYMBALTA) 30 MG capsule Take 1 capsule (30 mg total) by mouth daily. 30 capsule 3  . guaiFENesin (MUCINEX) 600 MG 12 hr tablet Take 300 mg by mouth 2 (two) times daily.    . Magnesium Oxide 250 MG TABS Take 1 tablet by mouth daily.     . metoprolol tartrate (LOPRESSOR) 50 MG tablet TAKE ONE-HALF TABLET BY  MOUTH TWICE DAILY 90 tablet 3  . nitroGLYCERIN (NITROLINGUAL) 0.4 MG/SPRAY spray Place 1 spray under the tongue every 5 (five) minutes x 3 doses as needed for chest pain. 4.9 g 2  . pantoprazole (PROTONIX) 40 MG tablet Take 1 tablet (40 mg total) by mouth daily. 30 tablet 3  . predniSONE (DELTASONE) 5 MG tablet Take by mouth.    . TRELEGY ELLIPTA 100-62.5-25 MCG/INH AEPB Inhale 1 puff into the lungs daily.     . valsartan (DIOVAN) 40 MG tablet Take 40 mg daily after breakfast 90 tablet 3   No current facility-administered medications on file prior to visit.    Allergies  Allergen Reactions  . Brilinta [Ticagrelor] Shortness Of Breath    Having breathing issues  . Advicor [Niacin-Lovastatin Er] Other (See Comments)    Headache, possibly other reactions  . Lipitor [Atorvastatin] Other (See Comments)    Headache, possibly other reactions  . Pravachol [Pravastatin Sodium] Other (See Comments)    Headache, possibly other reactions  . Sulfa Antibiotics Other (See Comments)    Headache, possibly other reactions  . Repatha [Evolocumab] Other (See Comments)    Malaise, breathing issues  . Rosuvastatin Other (See Comments)     40 MG  TABLET--EXTREME FATIGUE  , MUSCLE  PAIN     Past Medical History:  Diagnosis Date  . Arthritis    intermittent  . CAD in native artery 1994, 2001   a) 2001 Exertional Angina --> Myoview: Inf infarct & Ant ischemia --> Cath: 95% pLAD, 99% D1, 80% RI & Cx-OM1&2 OK , & patent RCA stent --> CABG: b) Cath 2003: patent Grafts & stents; c) 07/2014: PCI-Ost LCx (not bypassed) - Xience DES 3.0 x 15 (3.6 mm); d) 11/16/2019 (NSTEMI) -> 100% RCA ISR/T (Res.Onyx DES 2.5 x 30 - 2.8 mm) & 80% ost LCx ISR - dLM-OstLCx (Res Onyx 3.5  x 15 - 3.8 mm)  . Childhood asthma   . Hemorrhoids    history of  . History of ST elevation myocardial infarction (STEMI) of inferior wall 1994; 1997   PCI of RCA - redo PCI BMS ML Vision 3.0 mm x 25 mm to RCA in 1997  . Hyperlipidemia LDL goal <70    At goal by Most recent labs October 2013: TC 123, TG 83, HDL 81, LDL 65  . Hypertension   . Ischemic cardiomyopathy 1994, 2003   Echo February 2014: EF 40-45%; moderate HK of anterolateral myocardium.; Grade 1 diastolic dysfunction  . Kidney stones    3 times had lithrotripsy  . Obesity (BMI 30.0-34.9)   . Pneumonia 2014  . S/P CABG x 3 2001   LIMA-LAD, SVG-D1l, SVG-(RI) - patent by Cath in 2003, RCA  & LCx not Grafted    Past Surgical History:  Procedure Laterality Date  . CARDIAC CATHETERIZATION  06/06/1999   2001 Exertional Angina --> Myoview: Inf infarct and Ant ischemia --> Cath: 95% pLAD, 99% D1, 80% RI & Cx-OM1&2 OK , & patent RCA stent --> CABG  . CARDIAC CATHETERIZATION  10/04/2001   Patent grafts with a 50% lesion in LAD beyond IMA insertion  . CARDIAC CATHETERIZATION N/A 07/31/2014   Procedure: Left Heart Cath and Cors/Grafts Angiography;  Surgeon: Jettie Booze, MD;  Location: [facility];  Service: Cardiovascular;  Laterality: N/A;  . CARDIAC CATHETERIZATION N/A 07/31/2014   Procedure: Coronary Stent Intervention;  Surgeon: Jettie Booze, MD;  Location: [facility];  Service: Cardiovascular: PCI to Ostial Cx (not bypassed) - Xience DES 3.0 x 15 (3.6 mm)  . CAROTID DOPPLER  05/06/2012   Less than 50% diameter reduction of the Lft Subclavian.  . CORONARY ANGIOGRAPHY N/A 11/18/2019   Procedure: CORONARY ANGIOGRAPHY;  Surgeon: Troy Sine, MD;  Location: [facility];  Service: Cardiovascular; CONFIRMS p-mRCA stents (from 9/5); Stable Ostial Lcx 95% - for staged PCI; Ost LAD ~50% with diffuse diseaes (competetive flow from LIMA noted), RI 80%-100%, D1 100%.   . CORONARY ANGIOPLASTY WITH STENT PLACEMENT  01/21/1996   PTCA of RCA-95% lesion/per wife 2015  . CORONARY ARTERY BYPASS GRAFT  06/07/1999   x3. LIMA to distal LAD, SVG to first diag, and SVG to ramus  . CORONARY STENT INTERVENTION N/A 11/16/2019   Procedure: CORONARY STENT INTERVENTION;  Surgeon: Troy Sine, MD;  Location: [facility];  Service: Cardiovascular;; mRCA 100% In-stent thrombosis --> Resolute Onyx DES 2.5 x 30 (overlapping prior stent & covering lesion distal to prior stent) -> post-dilated to 2.8 mm  . CORONARY STENT INTERVENTION N/A 11/18/2019   Procedure: CORONARY STENT INTERVENTION;  Surgeon: Troy Sine, MD;  Location: [facility];  Service: Cardiovascular;;  95% ostial LCx (at proximal edge of prior stent) -> DES PCI from LM-ostLCx (overlapping prior stent in LCx) - Scoring Balloon PTCA followed by Resolute Onyx DES 3.5 x 15 mm - post-dilated 3.8 - 3.7 mm. (did not loose LAD flow)  . CORONARY/GRAFT ACUTE MI REVASCULARIZATION N/A 11/16/2019   Procedure: Coronary/Graft Acute MI Revascularization;  Surgeon: Troy Sine, MD;  Location: [facility];  Service: Cardiovascular;  --> PTCA-DES PCI mRCA 100% ISR/thrombosis  . HYDROCELE EXCISION Left 02/28/2016   Procedure: HYDROCELECTOMY ADULT;  Surgeon: Kathie Rhodes, MD;  Location: [facility];  Service: Urology;  Laterality: Left;  . LEFT HEART CATH AND CORS/GRAFTS ANGIOGRAPHY N/A 11/16/2019  Procedure: LEFT HEART CATH AND CORS/GRAFTS ANGIOGRAPHY;  Surgeon: Troy Sine, MD;  Location: [facility];  Service: Cardiovascular;; For ACS --> 100% p-mRCA In-stent Thrombosis & disease outside of stent; Ost LCx 95% at prox edge of prior stent, OM & LPL branches - OK.  Ost LAD ~50% diffuse Dz, competetive flow from LIMA: RI & D1 ~CTO; Patent LIMA-LAD, SVG-RI, SVG-D1  . LITHOTRIPSY  "2-3 times"   1988/2003/2007  . NM MYOVIEW LTD  08/19/2009   Moderafte perfusion seen in Basal inferoseptal, Basal inferior, Mid inferoseptal, Mid inferiorm, and Apical inferior regions. No ECG changes. EKG negative for ischemia.;   . TRANSTHORACIC ECHOCARDIOGRAM  05/06/2012   EF 40-45%, LV cavity moderately dilated, systolic function mild-moderately reduced, moderate hypokinesis of anterolatewral myocardium.  . TRANSTHORACIC ECHOCARDIOGRAM  11/15/2019   (ACS- 100% RCA-PCI, 95%LCx - staged PCI): EF 55-60%, ~no RWMA, GR1 DD.  ~ Normal Valves.    Family History  Problem Relation Age of Onset  . Heart attack Mother   . Heart disease Mother   . Stroke Sister        during cancer Rx  . Lymphoma Sister   . Lung cancer Sister     Social History   Socioeconomic History  . Marital status: Married    Spouse name: Not on file   . Number of children: 2  . Years of education: Not on file  . Highest education level: Not on file  Occupational History  . 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 Smoking status: Current Every Day Smoker    Packs/day: 0.25    Years: 48.00    Pack years: 12.00    Types: Cigarettes  . Smokeless tobacco: Never Used  . Tobacco comment: Was not able to maintain smoking cessation, -no longer has Chantix available.  Substance and Sexual Activity  . Alcohol use: No  . Drug use: No  . Sexual activity: Never  Other Topics Concern  . Not on file  Social History Narrative   Married father of 2 with one grandchild. By his wife.   Continues to smoke one half pack a day.   Very physically active at work but no routine exercise.      No living will    Wife would make decisions for him if needed. Sons would be alternates   Would accept resuscitation   Doesn't want prolonged machines or intervention   Social Determinants of Health   Financial Resource Strain: Not on file  Food Insecurity: Not on file  Transportation Needs: Not on file  Physical Activity: Not on file  Stress: Not on file  Social Connections: Not on file  Intimate Partner Violence: Not on file   Review of Systems  Eats oatmeal for breakfast--then not again till supper Weight down 42# since the hospitalization for heart     Objective:   Physical Exam Constitutional:      Appearance: Normal appearance.  Neurological:     Mental Status: He is alert.  Psychiatric:     Comments: Still with some psychomotor retardation            Assessment & Plan:

## 2020-05-07 NOTE — Assessment & Plan Note (Signed)
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

## 2020-05-07 NOTE — Assessment & Plan Note (Signed)
Has responded a little to the duloxetine--but still anhedonic Will increase to 60mg  daily

## 2020-05-07 NOTE — Assessment & Plan Note (Signed)
Seems to be limited in activity due to htis

## 2020-05-12 DIAGNOSIS — R0600 Dyspnea, unspecified: Secondary | ICD-10-CM | POA: Diagnosis not present

## 2020-05-12 DIAGNOSIS — J449 Chronic obstructive pulmonary disease, unspecified: Secondary | ICD-10-CM | POA: Diagnosis not present

## 2020-05-12 DIAGNOSIS — R5382 Chronic fatigue, unspecified: Secondary | ICD-10-CM | POA: Diagnosis not present

## 2020-05-17 ENCOUNTER — Ambulatory Visit: Payer: Medicare Other | Admitting: Cardiology

## 2020-05-17 ENCOUNTER — Encounter: Payer: Self-pay | Admitting: Cardiology

## 2020-05-17 ENCOUNTER — Other Ambulatory Visit: Payer: Self-pay

## 2020-05-17 VITALS — BP 130/77 | HR 64 | Ht 68.0 in | Wt 167.2 lb

## 2020-05-17 DIAGNOSIS — I10 Essential (primary) hypertension: Secondary | ICD-10-CM

## 2020-05-17 DIAGNOSIS — F17210 Nicotine dependence, cigarettes, uncomplicated: Secondary | ICD-10-CM

## 2020-05-17 DIAGNOSIS — R0609 Other forms of dyspnea: Secondary | ICD-10-CM

## 2020-05-17 DIAGNOSIS — Z955 Presence of coronary angioplasty implant and graft: Secondary | ICD-10-CM

## 2020-05-17 DIAGNOSIS — E441 Mild protein-calorie malnutrition: Secondary | ICD-10-CM | POA: Diagnosis not present

## 2020-05-17 DIAGNOSIS — I2510 Atherosclerotic heart disease of native coronary artery without angina pectoris: Secondary | ICD-10-CM | POA: Diagnosis not present

## 2020-05-17 DIAGNOSIS — J411 Mucopurulent chronic bronchitis: Secondary | ICD-10-CM | POA: Diagnosis not present

## 2020-05-17 DIAGNOSIS — I255 Ischemic cardiomyopathy: Secondary | ICD-10-CM

## 2020-05-17 DIAGNOSIS — E785 Hyperlipidemia, unspecified: Secondary | ICD-10-CM | POA: Diagnosis not present

## 2020-05-17 MED ORDER — VALSARTAN 80 MG PO TABS: 80.0000 mg | ORAL_TABLET | Freq: Every day | ORAL | 3 refills | Status: AC

## 2020-05-17 NOTE — Assessment & Plan Note (Signed)
Blood pressure looks good. Mild permissive hypertension right now based on his fatigue. Plan: Wean off beta-blocker and increase valsartan up to 80 mg.  Cough was notably improved with switching from ACE inhibitor to ARB

## 2020-05-17 NOTE — Assessment & Plan Note (Signed)
Very limited by his COPD now.  Being described as class I-II COPD, he is extremely mucus congestion, probably related to bronchiectasis.  Now on multiple medications including Mucinex.  I wonder how effective home oxygen therapy would be.   Needs to stop smoking.Marland Kitchen

## 2020-05-17 NOTE — Assessment & Plan Note (Signed)
Unfortunately, he is still smoking.  This is despite the fact that his PCP, pulmonologist and myself are talking about smoking cessation.  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  I am actually very concerned about some type of cancer but all of his weight loss.  We did not give the details of smoking cessation counseling, but I did comment on it.

## 2020-05-17 NOTE — Assessment & Plan Note (Signed)
His labs look good but he is on Repatha, but he has been off it since January.  Right now with his significant weight loss, borderline malnutrition and essentially failure to thrive, I am not interested in putting any more medications on board that could potentially be a source of symptoms.  Continue to monitor lipids, but hold off on Repatha for now.

## 2020-05-17 NOTE — Progress Notes (Signed)
Primary Care Provider: Venia Carbon, MD Cardiologist: Glenetta Hew, MD Electrophysiologist: None Pulmonologist: Dr. [facility])  Clinic Note: Chief Complaint  Patient presents with  . Shortness of Breath    Progressively worsening shortness of breath  . Fatigue    Absolutely no energy,; associated with significant weight loss  . Coronary Artery Disease    No angina  . Follow-up    6 months   ===================================  ASSESSMENT/PLAN   Problem List Items Addressed This Visit    Chronic dyspnea - Primary    I agree that this seems to be most consistent with COPD, not likely to be's CHF, however with what he describes as progressively worsening symptoms, we can check a 2D echocardiogram just to confirm.      Malnutrition of mild degree ([place])    He has had ongoing weight loss since prior to his PCI.  I do agree that it is probably multifactorial.  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  We talked about dietary supplementation as well.  I suspect this is also contributing to his extreme fatigue.  My major concern is that he may be missing a possible cancer.  I told him we will discuss with his PCP.  Perhaps a PET scan of some sort would not unreasonable.  He is not having cardiac cachexia with normal ejection fraction, no CHF symptoms and no angina.        CAD in native artery - status post CABG x3 after PCI x2 to the RCA (LIMA-LAD, SVG-diagonal SVG-RI (Chronic)    He is now completely revascularized either with grafts or stents.  He is graft to the ramus intermedius, and LAD -diagonal as well as stents in the native RCA and LeftMain-LCx. No further anginal symptoms after most recent PCI. He has significant dyspnea, but is not having any angina or heart failure symptoms. He is having more fatigue.  Plan:  Wean off beta-blocker as 1 remaining potential medication causing fatigue.  Increase losartan to 80  mg.  Continue maintenance Plavix.  No longer on aspirin.  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  Not on aspirin because of bruising.  Would likely be on long standing Plavix based on extensive stents in the RCA and LM-LCx.   As of the October 2022, okay to hold for procedures.      Ischemic cardiomyopathy (Chronic)    EF back in 2014 was 40 to 45%, following his recent MI and PCI, his EF is actually back in the normal range.  No clinical symptoms of CHF-no PND, orthopnea or edema.  Euvolemic on exam.  Plan: Continue ARB-increased dose, and wean off beta-blocker because of fatigue. No diuretic requirement.      Relevant Medications   valsartan (DIOVAN) 80 MG tablet   Other Relevant Orders   EKG 12-Lead   ECHOCARDIOGRAM COMPLETE   Hyperlipidemia with target LDL less than 70 (Chronic)    His labs look good but he is on Repatha, but he has been off it since January.  Right now with his significant weight loss, borderline malnutrition and essentially failure to thrive, I am not interested in putting  any more medications on board that could potentially be a source of symptoms.  Continue to monitor lipids, but hold off on Repatha for now.      Relevant Medications   valsartan (DIOVAN) 80 MG tablet   Essential hypertension (Chronic)    Blood pressure looks good. Mild permissive hypertension right now based on his fatigue. Plan: Wean off beta-blocker and increase valsartan up to 80 mg.  Cough was notably improved with switching from ACE inhibitor to ARB       Relevant Medications   valsartan (DIOVAN) 80 MG tablet   Cigarette smoker one half pack a day or less (Chronic)    Unfortunately, he is still  smoking.  This is despite the fact that his PCP, pulmonologist and myself are talking about smoking cessation.  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  I am actually very concerned about some type of cancer but all of his weight loss.  We did not give the details of smoking cessation counseling, but I did comment on it.       COPD (chronic obstructive pulmonary disease) (HCC) (Chronic)    Very limited by his COPD now.  Being described as class I-II COPD, he is extremely mucus congestion, probably related to bronchiectasis.  Now on multiple medications including Mucinex.  I wonder how effective home oxygen therapy would be.   Needs to stop smoking..         ===================================  HPI:    Benjamin Robertson is a 70 y.o. male with a PMH notable for CAD (reviewed below), longstanding smoker with COPD (by report stage I-II-evidence of emphysema and chronic bronchitis), Depression, and history of Dysphagia who presents today for for 4-month follow-up with complaints of progressively worsening shortness of breath and fatigue.  Cardiac Hx  1994 - Inf STEMI PTCA RCA   1997 - BMS PCI RCA  2001 - Progressive Angina -> RCA stent patent & LCx-OM OK, Ost LAD & RI disease --> CABG x 3 (LIMA-LAD, SVG-D1, SVG-RI); - Low EF  ? 2003 - Patent Stent & Grafts.  ? 2011 - Myoview: Mod Basal-Mid Inferoseptal & Basal-apical Inferior Infarct, No Ischemia; Low ER recsolved.   04/2014 - NSTEMI - > DES PCI Ost LCx (Xience DES 3 x 15 - 3.6 mm)  11/2019 - NSTEMI  -> 100% RCA ISR (DES PCI Resolute Onyx 2.5 x 30 from distal stent - well prox -- 2.8), 80% Cx ISR (Staged DES PCI Res. Onyx 3.5 x 15 -> LM-LCx -> 3.8-37 mm)  Echo EF 55 to 60%.  Unable to fully evaluate regional wall motion abnormalities.  GR 1 DD.  Unable to assess RV pressures but normal RAP.,  Relatively normal valves.   Benjamin Robertson was last seen on February 26, 2020 for the first follow-up  with me after his MI and PCI.  He mentioned that he was not having any further anginal symptoms.  One episode of twinging pain after he went home, but none since.  He did mention having lost about 30 pounds in the past year and additional 10 pounds since his last visit.  Unfortunately he had gone back to smoking maybe quarter pack a day.  He had been doing well with Chantix, but then it was removed with market. -> He noted significant provement in his breathing issues when we switch him from Brilinta to Plavix.  We had referred him to pulmonary medicine-chest CT is pending.  He  was doing lots of activity and try to get back into his baseline yard work and doing work in his [employer].  Not doing routine exercise.  I switched him from ACE inhibitor to ARB because of cough  He was having up-and-down blood pressures.  Referred to CVRR.  Was supposed to be on Repatha.  Recent Hospitalizations/NonCardiology Follow-Up Visits::   He is being followed by his pulmonologist on January 5.  Noted stage I-II COPD-still smoking.  Noting shortness of breath, wheezing with intermittent bronchitis.  CTA chest showed no evidence of fibrosis with mild emphysema.  Interestingly, symptoms improved with prednisone. => He was started on Mucinex 600 mg twice daily, albuterol, Trelegy with prednisone taper.  Smoking cessation counseling provided.  PFTs ordered.  On follow-up, continue to note-> morning congestion, no fevers or chills.  Also noted weight loss with decreased appetite.  There was a question of what was the cause for weight loss.   His PCP is also monitoring his fatigue, weight loss and depression.   Reviewed  CV studies:    The following studies were reviewed today: (if available, images/films reviewed: From Epic Chart or Care Everywhere) . No recent studies:   Interval History:   Benjamin Robertson returns here for 71-month follow-up almost exasperated.  He has clearly lost a lot of weight, is sitting  tripod on the table.  He says that he is extremely short of breath, he gets short of breath just simply walking around the room.  He does not have any PND orthopnea, no edema.  He has not had any chest pain since the last stent.  He denies any rapid irregular heartbeats palpitations.  He does have occasional dizziness but no syncope or near syncope. He actually said that he was able to go back to work and full activity shortly after the stent was placed back in September.  He has not had any any MI type angina since, he had one twinging symptom the day went home, but nothing since.  Although he has shortness of breath, he really does not have a lot of cough.  Since I switched him from the ACE inhibitor to the ARB, he has not had any further dry hacking cough.  He still has his morning cough to clear the phlegm.Marland Kitchen  His major complaints are that he is extremely fatigued with absolutely no energy.  Dyspneic with just about any activity even simply getting up in the morning.  He also is very concerned about weight loss.  Lost significant muscle mass.  Despite sleeping very well, he is continuously fatigued once he wakes up.  He wakes up short of breath with a lot of congestion in his chest, coughing things up.  He describes having a significant amount of mucus production that he has a hard time coughing up.   They are very concerned about the fact that he has lost close of 40-45 pounds in the last several months, quite clearly unintentional -> this does seem to stabilize over the last week..  He has lost lots of muscle mass.  Is extremely weak and tired.  He has had TSH and all evaluations done that have been negative.  Testosterone is borderline low. Conflicting data about whether his appetite is good.  Seems like he is not eating as much, but is still eating.  His wife has purchased some meal supplementation when boost to see if this will help him.  Dr. Silvio Pate is concerned that some of his weight  loss  related to pulmonary cachexia and depression.  (I tended to agree, however also concerned about possibility of an unidentified cancer)  Repatha was stopped in January.  Patient was complaining of ongoing shortness of breath and tightness in his chest off and on.  After the most recent Repatha shot, symptoms worsen.  CV Review of Symptoms (Summary) Cardiovascular ROS: positive for - dyspnea on exertion, shortness of breath and Weakness and fatigue.-No energy.  Occasional dizziness but not really vertigo.  No near syncope. negative for - chest pain, edema, irregular heartbeat, loss of consciousness, orthopnea, palpitations, rapid heart rate or He may wake up short of breath, but not with PND, no TIA or amaurosis fugax symptoms.  No claudication.   The patient does not have symptoms concerning for COVID-19 infection (fever, chills, cough, or new shortness of breath).   REVIEWED OF SYSTEMS   Review of Systems  Constitutional: Positive for malaise/fatigue (Significant/profound) and weight loss (Over 40 pounds in a year).  HENT: Positive for congestion (More chest congestion).   Respiratory: Positive for sputum production (Mucousy phlegm), shortness of breath (Progressively worsening) and wheezing (Not so much now.).        Thank mucus congestion throughout his chest.  Wakes up with the need to cough vigorously.  Very short of breath.  Cardiovascular: Negative for chest pain and claudication.  Gastrointestinal: Negative for blood in stool and melena.  Genitourinary: Negative for hematuria.  Musculoskeletal: Positive for joint pain. Negative for falls.  Neurological: Positive for dizziness (When he is short of breath or turns around too fast.) and weakness (Global; loss of muscle mass).  Psychiatric/Behavioral: Positive for depression. Negative for memory loss and suicidal ideas. The patient is nervous/anxious.    I have reviewed and (if needed) personally updated the patient's problem list,  medications, allergies, past medical and surgical history, social and family history.   PAST MEDICAL HISTORY   Past Medical History:  Diagnosis Date  . Arthritis    intermittent  . CAD in native artery 1994, 2001   a) 2001 Exertional Angina --> Myoview: Inf infarct & Ant ischemia --> Cath: 95% pLAD, 99% D1, 80% RI & Cx-OM1&2 OK , & patent RCA stent --> CABG: b) Cath 2003: patent Grafts & stents; c) 07/2014: PCI-Ost LCx (not bypassed) - Xience DES 3.0 x 15 (3.6 mm); d) 11/16/2019 (NSTEMI) -> 100% RCA ISR/T (Res.Onyx DES 2.5 x 30 - 2.8 mm) & 80% ost LCx ISR - dLM-OstLCx (Res Onyx 3.5 x 15 - 3.8 mm)  . Childhood asthma   . Hemorrhoids    history of  . History of ST elevation myocardial infarction (STEMI) of inferior wall 1994; 1997   PCI of RCA - redo PCI BMS ML Vision 3.0 mm x 25 mm to RCA in 1997  . Hyperlipidemia LDL goal <70    At goal by Most recent labs October 2013: TC 123, TG 83, HDL 81, LDL 65  . Hypertension   . Ischemic cardiomyopathy 1994, 2003   Echo February 2014: EF 40-45%; moderate HK of anterolateral myocardium.; Grade 1 diastolic dysfunction  . Kidney stones    3 times had lithrotripsy  . Obesity (BMI 30.0-34.9)   . Pneumonia 2014  . S/P CABG x 3 2001   LIMA-LAD, SVG-D1l, SVG-(RI) - patent by Cath in 2003, RCA & LCx not Grafted    PAST SURGICAL HISTORY   Past Surgical History:  Procedure Laterality Date  . CARDIAC CATHETERIZATION  06/06/1999   2001 Exertional Angina -->  Myoview: Inf infarct and Ant ischemia --> Cath: 95% pLAD, 99% D1, 80% RI & Cx-OM1&2 OK , & patent RCA stent --> CABG  . CARDIAC CATHETERIZATION  10/04/2001   Patent grafts with a 50% lesion in LAD beyond IMA insertion  . CARDIAC CATHETERIZATION N/A 07/31/2014   Procedure: Left Heart Cath and Cors/Grafts Angiography;  Surgeon: Jettie Booze, MD;  Location: [facility];  Service: Cardiovascular;  Laterality: N/A;  . CARDIAC CATHETERIZATION N/A 07/31/2014   Procedure: Coronary Stent  Intervention;  Surgeon: Jettie Booze, MD;  Location: [place] [facility];  Service: Cardiovascular: PCI to Ostial Cx (not bypassed) - Xience DES 3.0 x 15 (3.6 mm)  . CAROTID DOPPLER  05/06/2012   Less than 50% diameter reduction of the Lft Subclavian.  . CORONARY ANGIOGRAPHY N/A 11/18/2019   Procedure: CORONARY ANGIOGRAPHY;  Surgeon: Troy Sine, MD;  Location: [place] [facility];  Service: Cardiovascular; CONFIRMS p-mRCA stents (from 9/5); Stable Ostial Lcx 95% - for staged PCI; Ost LAD ~50% with diffuse diseaes (competetive flow from LIMA noted), RI 80%-100%, D1 100%.   . CORONARY ANGIOPLASTY WITH STENT PLACEMENT  01/21/1996   PTCA of RCA-95% lesion/per wife 2015  . CORONARY ARTERY BYPASS GRAFT  06/07/1999   x3. LIMA to distal LAD, SVG to first diag, and SVG to ramus  . CORONARY STENT INTERVENTION N/A 11/16/2019   Procedure: CORONARY STENT INTERVENTION;  Surgeon: Troy Sine, MD;  Location: [place] [facility];  Service: Cardiovascular;; mRCA 100% In-stent thrombosis --> Resolute Onyx DES 2.5 x 30 (overlapping prior stent & covering lesion distal to prior stent) -> post-dilated to 2.8 mm  . CORONARY STENT INTERVENTION N/A 11/18/2019   Procedure: CORONARY STENT INTERVENTION;  Surgeon: Troy Sine, MD;  Location: [facility] [facility];  Service: Cardiovascular;; 95% ostial LCx (at proximal edge of prior stent) -> DES PCI from LM-ostLCx (overlapping prior stent in LCx) - Scoring Balloon PTCA followed by Resolute Onyx DES 3.5 x 15 mm - post-dilated 3.8 - 3.7 mm. (did not loose LAD flow)  . CORONARY/GRAFT ACUTE MI REVASCULARIZATION N/A 11/16/2019   Procedure: Coronary/Graft Acute MI Revascularization;  Surgeon: Troy Sine, MD;  Location: [place] [facility];  Service: Cardiovascular;  --> PTCA-DES PCI mRCA 100% ISR/thrombosis  . HYDROCELE EXCISION Left 02/28/2016   Procedure: HYDROCELECTOMY ADULT;  Surgeon: Kathie Rhodes, MD;  Location: [facility];  Service: Urology;  Laterality: Left;  . LEFT HEART  CATH AND CORS/GRAFTS ANGIOGRAPHY N/A 11/16/2019   Procedure: LEFT HEART CATH AND CORS/GRAFTS ANGIOGRAPHY;  Surgeon: Troy Sine, MD;  Location: [place] [facility];  Service: Cardiovascular;; For ACS --> 100% p-mRCA In-stent Thrombosis & disease outside of stent; Ost LCx 95% at prox edge of prior stent, OM & LPL branches - OK.  Ost LAD ~50% diffuse Dz, competetive flow from LIMA: RI & D1 ~CTO; Patent LIMA-LAD, SVG-RI, SVG-D1  . LITHOTRIPSY  "2-3 times"   1988/2003/2007  . NM MYOVIEW LTD  08/19/2009   Moderafte perfusion seen in Basal inferoseptal, Basal inferior, Mid inferoseptal, Mid inferiorm, and Apical inferior regions. No ECG changes. EKG negative for ischemia.;   . TRANSTHORACIC ECHOCARDIOGRAM  05/06/2012   EF 40-45%, LV cavity moderately dilated, systolic function mild-moderately reduced, moderate hypokinesis of anterolatewral myocardium.  . TRANSTHORACIC ECHOCARDIOGRAM  11/15/2019   (ACS- 100% RCA-PCI, 95%LCx - staged PCI): EF 55-60%, ~no RWMA, GR1 DD.  ~ Normal Valves.    ECHO 11/15/2019: EF ~55 to 60%. ~ normal WM.  Gr 1 DD. Unable to assess PAP, but normal RAP. Relatively normal Atriae.    LHC 11/16/19: prox-mid RCA stent 100% ISR/thrombosis (DES PCI). Ost LCx 90% ISR (Staged PCI) & Lat OM1 90%. Ost-prox RI 80%, Ost-prox LAD 50%; Patent Grafts.  Prox-mid RCA stent 100% -> DES PCI Resolute DES 2.5 x 30 mm (2.8 mm).  Staged DES PCI  11/18/2019: LM-LCx  80% -> Scoring Balloon PTCA - DES PCI -> Resolute Onyx DES 3.5 x 15 -- 3.8 mm        Immunization History  Administered Date(s) Administered  . Fluad Quad(high Dose 65+) 03/03/2020  . Influenza, High Dose Seasonal PF 12/02/2018  . Influenza,inj,Quad PF,6+ Mos 12/05/2017  . Pneumococcal Conjugate-13 05/30/2017  . Pneumococcal Polysaccharide-23 03/03/2019  . Tdap 06/01/2017    MEDICATIONS/ALLERGIES   Current Meds  Medication Sig  . albuterol (VENTOLIN HFA) 108 (90 Base) MCG/ACT inhaler Inhale 2 puffs into the lungs every 6  (six) hours as needed for wheezing or shortness of breath.  . clopidogrel (PLAVIX) 75 MG tablet Take 1 tablet (75 mg total) by mouth daily.  . DULoxetine (CYMBALTA) 60 MG capsule Take 1 capsule (60 mg total) by mouth daily.  Marland Kitchen guaiFENesin (MUCINEX) 600 MG 12 hr tablet Take 300 mg by mouth 2 (two) times daily.  . Magnesium Oxide 250 MG TABS Take 1 tablet by mouth daily.   . nitroGLYCERIN (NITROLINGUAL) 0.4 MG/SPRAY spray Place 1 spray under the tongue every 5 (five) minutes x 3 doses as needed for chest pain.  . pantoprazole (PROTONIX) 40 MG tablet Take 1 tablet (40 mg total) by mouth daily.  . predniSONE (DELTASONE) 5 MG tablet Take by mouth.  . TRELEGY ELLIPTA 100-62.5-25 MCG/INH AEPB Inhale 1 puff into the lungs daily.  . valsartan (DIOVAN) 80 MG tablet Take 1 tablet (80 mg total) by mouth daily.  . [DISCONTINUED] metoprolol tartrate (LOPRESSOR) 50 MG tablet TAKE ONE-HALF TABLET BY  MOUTH TWICE DAILY  . [DISCONTINUED] valsartan (DIOVAN) 40 MG tablet Take 40 mg daily after breakfast    Allergies  Allergen Reactions  . Brilinta [Ticagrelor] Shortness Of Breath    Having breathing issues  . Advicor [Niacin-Lovastatin Er] Other (See Comments)    Headache, possibly other reactions  . Lipitor [Atorvastatin] Other (See Comments)    Headache, possibly other reactions  . Pravachol [Pravastatin Sodium] Other (See Comments)    Headache, possibly other reactions  . Sulfa Antibiotics Other (See Comments)    Headache, possibly other reactions  . Repatha [Evolocumab] Other (See Comments)    Malaise, breathing issues  . Rosuvastatin Other (See Comments)     40 MG  TABLET--EXTREME FATIGUE  , MUSCLE  PAIN     SOCIAL HISTORY/FAMILY HISTORY   Reviewed in Epic:  Pertinent findings:  Social History   Tobacco Use  . Smoking status: Current Every Day Smoker    Packs/day: 0.25    Years: 48.00    Pack years: 12.00    Types: Cigarettes  . Smokeless tobacco: Never Used  . Tobacco comment: Was not  able to maintain smoking cessation, -no longer has Chantix available.  Substance Use Topics  . Alcohol use: No  . Drug use: No   Social History   Social History Narrative   Married father of 2 with one grandchild. By his wife.   Continues to smoke one half pack a day.   Very physically active at work but no routine exercise.      No living will  Wife would make decisions for him if needed. Sons would be alternates   Would accept resuscitation   Doesn't want prolonged machines or intervention    OBJCTIVE -PE, EKG, labs   Wt Readings from Last 6 Encounters:  07/10/2019 210 lb (95.3 kg)  11/28/2019 188 lb 9.6oz (85.5 kg)  02/26/2020  179 lb 9.5 oz (81.5 kg)  04/06/20 173 lb (78.5 kg)  05/07/20 170 lb (77.1 kg)  05/17/20 167 lb 3.2 oz (75.8 kg)    Physical Exam: BP 130/77   Pulse 64   Ht 5\' 8"  (1.727 m)   Wt 167 lb 3.2 oz (75.8 kg)   SpO2 95%   BMI 25.42 kg/m  Physical Exam Constitutional:      General: He is not in acute distress.    Appearance: He is ill-appearing (Somewhat chronically ill-appearing.). He is not toxic-appearing.     Comments: Notable weight loss.  Loss of muscle mass in the shoulders, arms, chest and legs. He is sitting borderline tripod on the examination table.  HENT:     Head: Normocephalic and atraumatic.  Eyes:     Extraocular Movements: Extraocular movements intact.     Comments: Right eye has significant erythema on the conjunctivae  Neck:     Vascular: No carotid bruit, hepatojugular reflux or JVD.  Cardiovascular:     Rate and Rhythm: Normal rate and regular rhythm.     Pulses: Decreased pulses (Mild diminished pedal pulses.).     Heart sounds: S1 normal and S2 normal. Heart sounds are distant. No murmur heard. No friction rub. No gallop. No S4 sounds.   Pulmonary:     Effort: Accessory muscle usage (Sitting tripod) present. No respiratory distress.     Breath sounds: No stridor. Examination of the right-upper field reveals decreased  breath sounds. Examination of the left-upper field reveals decreased breath sounds. Decreased breath sounds and wheezing (Mild end expiratory) present. No rales.  Chest:     Chest wall: No crepitus or edema. There is no dullness to percussion.  Musculoskeletal:        General: No swelling. Normal range of motion.     Cervical back: Normal range of motion and neck supple.  Skin:    General: Skin is warm and dry.     Coloration: Skin is not pale.  Neurological:     General: No focal deficit present.     Mental Status: He is alert and oriented to person, place, and time.     Motor: No weakness.  Psychiatric:        Thought Content: Thought content normal.        Judgment: Judgment normal.     Comments: Notably depressed mood.  Looks and feels tired.      Adult ECG Report  Rate: 64 ;  Rhythm: normal sinus rhythm and Left atrial abnormality.  Otherwise normal axis, intervals and durations..;   Narrative Interpretation: Stable  Recent Labs: Reviewed.  Unfortunately, no longer on Repatha. Lab Results  Component Value Date   CHOL 143 03/03/2020   HDL 48.80 03/03/2020   LDLCALC 69 03/03/2020   LDLDIRECT 154.0 10/06/2019   TRIG 124.0 03/03/2020   CHOLHDL 3 03/03/2020   Lab Results  Component Value Date   CREATININE 1.00 03/03/2020   BUN 19 03/03/2020   NA 138 03/03/2020   K 4.7 03/03/2020   CL 101 03/03/2020   CO2 31 03/03/2020   CBC Latest Ref Rng & Units 03/03/2020 11/19/2019 11/18/2019  WBC 4.0 -  10.5 K/uL 8.9 7.3 8.5  Hemoglobin 13.0 - 17.0 g/dL 17.4(H) 14.6 14.3  Hematocrit 39.0 - 52.0 % 51.7 44.6 42.8  Platelets 150.0 - 400.0 K/uL 253.0 179 194    No results found for: TSH  ==================================================  COVID-19 Education: The signs and symptoms of COVID-19 were discussed with the patient and how to seek care for testing (follow up with PCP or arrange E-visit).   The importance of social distancing and COVID-19 vaccination was discussed  today. The patient is practicing social distancing & Masking.   I spent a total of 38 minutes with the patient spent in direct patient consultation.  Additional time spent with chart review  / charting (studies, outside notes, etc): 35 min Total Time: 73 min   Current medicines are reviewed at length with the patient today.  (+/- concerns)  -> Has not restarted Repatha.  This visit occurred during the SARS-CoV-2 public health emergency.  Safety protocols were in place, including screening questions prior to the visit, additional usage of staff PPE, and extensive cleaning of exam room while observing appropriate contact time as indicated for disinfecting solutions.  Notice: This dictation was prepared with Dragon dictation along with smaller phrase technology. Any transcriptional errors that result from this process are unintentional and may not be corrected upon review.  Patient Instructions / Medication Changes & Studies & Tests Ordered   Patient Instructions  Medication Instructions:    Weaning off the Metoprolol  50 mg   take 1/2 tablet of  50 mg Metoprolol  Once a day for 2 weeks then stop all together.  Increase Valsartan to 80 mg (2 tabs of current medication) -->  New Rx: Valsartan 80 mg PO daily; Disp 90 tab, 3 refills   *If you need a refill on your cardiac medications before your next appointment, please call your pharmacy*   Lab Work: Not needed   Testing/Procedures:  will be schedule at [place] has requested that you have an echocardiogram. Echocardiography is a painless test that uses sound waves to create images of your heart. It provides your doctor with information about the size and shape of your heart and how well your heart's chambers and valves are working. This procedure takes approximately one hour. There are no restrictions for this procedure.     Follow-Up: At [facility], you and your health needs are our  priority.  As part of our continuing mission to provide you with exceptional heart care, we have created designated Provider Care Teams.  These Care Teams include your primary Cardiologist (physician) and Advanced Practice Providers (APPs -  Physician Assistants and Nurse Practitioners) who all work together to provide you with the care you need, when you need it.  We recommend signing up for the patient portal called "MyChart".  Sign up information is provided on this After Visit Summary.  MyChart is used to connect with patients for Virtual Visits (Telemedicine).  Patients are able to view lab/test results, encounter notes, upcoming appointments, etc.  Non-urgent messages can be sent to your provider as well.   To learn more about what you can do with MyChart, go to [email or web address].    Your next appointment:   2 month(s)  The format for your next appointment:   In Person  Provider:   Glenetta Hew, MD       Studies Ordered:   Orders Placed This Encounter  Procedures  . EKG 12-Lead  . ECHOCARDIOGRAM COMPLETE  Glenetta Hew, M.D., M.S. Interventional Cardiologist   Pager # [phone] Phone # [phone] [address] [place], [place] [address]   Thank you for choosing Heartcare at [facility]!!

## 2020-05-17 NOTE — Assessment & Plan Note (Addendum)
He is now completely revascularized either with grafts or stents.  He is graft to the ramus intermedius, and LAD -diagonal as well as stents in the native RCA and LeftMain-LCx. No further anginal symptoms after most recent PCI. He has significant dyspnea, but is not having any angina or heart failure symptoms. He is having more fatigue.  Plan:  Wean off beta-blocker as 1 remaining potential medication causing fatigue.  Increase losartan to 80 mg.  Continue maintenance Plavix.  No longer on aspirin.  We will continue lifelong Plavix -> prefer not to hold until after September 2022

## 2020-05-17 NOTE — Assessment & Plan Note (Signed)
Back on Plavix alone.  Not on aspirin because of bruising.  Would likely be on long standing Plavix based on extensive stents in the RCA and LM-LCx.   As of the October 2022, okay to hold for procedures.

## 2020-05-17 NOTE — Assessment & Plan Note (Signed)
I agree that this seems to be most consistent with COPD, not likely to be's CHF, however with what he describes as progressively worsening symptoms, we can check a 2D echocardiogram just to confirm.

## 2020-05-17 NOTE — Patient Instructions (Addendum)
Medication Instructions:    Weaning off the Metoprolol  50 mg   take 1/2 tablet of  50 mg Metoprolol  Once a day for 2 weeks then stop all together.  Increase Valsartan to 80 mg (2 tabs of current medication) -->  New Rx: Valsartan 80 mg PO daily; Disp 90 tab, 3 refills   *If you need a refill on your cardiac medications before your next appointment, please call your pharmacy*   Lab Work: Not needed   Testing/Procedures:  will be schedule at [place] has requested that you have an echocardiogram. Echocardiography is a painless test that uses sound waves to create images of your heart. It provides your doctor with information about the size and shape of your heart and how well your heart's chambers and valves are working. This procedure takes approximately one hour. There are no restrictions for this procedure.     Follow-Up: At [facility], you and your health needs are our priority.  As part of our continuing mission to provide you with exceptional heart care, we have created designated Provider Care Teams.  These Care Teams include your primary Cardiologist (physician) and Advanced Practice Providers (APPs -  Physician Assistants and Nurse Practitioners) who all work together to provide you with the care you need, when you need it.  We recommend signing up for the patient portal called "MyChart".  Sign up information is provided on this After Visit Summary.  MyChart is used to connect with patients for Virtual Visits (Telemedicine).  Patients are able to view lab/test results, encounter notes, upcoming appointments, etc.  Non-urgent messages can be sent to your provider as well.   To learn more about what you can do with MyChart, go to [email or web address].    Your next appointment:   2 month(s)  The format for your next appointment:   In Person  Provider:   Glenetta Hew, MD

## 2020-05-17 NOTE — Assessment & Plan Note (Signed)
He has had ongoing weight loss since prior to his PCI.  I do agree that it is probably multifactorial.  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  We talked about dietary supplementation as well.  I suspect this is also contributing to his extreme fatigue.  My major concern is that he may be missing a possible cancer.  I told him we will discuss with his PCP.  Perhaps a PET scan of some sort would not unreasonable.  He is not having cardiac cachexia with normal ejection fraction, no CHF symptoms and no angina.

## 2020-05-17 NOTE — Assessment & Plan Note (Signed)
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  No clinical symptoms of CHF-no PND, orthopnea or edema.  Euvolemic on exam.  Plan: Continue ARB-increased dose, and wean off beta-blocker because of fatigue. No diuretic requirement.

## 2020-05-18 ENCOUNTER — Telehealth: Payer: Self-pay | Admitting: *Deleted

## 2020-05-18 NOTE — Progress Notes (Signed)
Done -routed office note , I hand written information on AVS

## 2020-05-18 NOTE — Telephone Encounter (Signed)
Patient's wife called stating that Dr. Raul Del ordered blood work on her husband and his testosterone lab work came back low at 249. Patient's wife stated that Dr. Raul Del told them to call Dr. Silvio Pate and let him know. Patient's wife stated that their son's testosterone level has low and he is on a pill called Clomiphene. Patient's wife wants to know if patent should be started on a medication. Mrs. Apostol is aware that Dr. Silvio Pate is out of the office this week.

## 2020-05-18 NOTE — Telephone Encounter (Signed)
PER DR HARDING REQUEST - RECENT OFFICE NOTE FROM 05/17/20 - ROUTED TO Dr Raul Del at [facility]

## 2020-05-19 NOTE — Telephone Encounter (Signed)
It is normal for men to have their testosterone levels go down as they get older. I am not sure why a pulmonologist would order this test. I am not a fan of using testosterone supplements but do order them in certain cases. If he is not having trouble with sex----I would generally not even consider it (and even then, not unless meds like viagra don't help). We can discuss this at more length if he wants at a visit

## 2020-05-20 NOTE — Telephone Encounter (Signed)
Spoke to pt's wife per DPR. She said the main reason is his fatigue. She will have him call and schedule an appt with Dr Silvio Pate.

## 2020-05-31 ENCOUNTER — Other Ambulatory Visit: Payer: Self-pay | Admitting: Cardiology

## 2020-06-09 ENCOUNTER — Other Ambulatory Visit: Payer: Self-pay

## 2020-06-09 ENCOUNTER — Ambulatory Visit (HOSPITAL_COMMUNITY): Payer: Medicare Other | Attending: Cardiovascular Disease

## 2020-06-09 DIAGNOSIS — I255 Ischemic cardiomyopathy: Secondary | ICD-10-CM | POA: Diagnosis not present

## 2020-06-09 DIAGNOSIS — I2510 Atherosclerotic heart disease of native coronary artery without angina pectoris: Secondary | ICD-10-CM

## 2020-06-09 LAB — ECHOCARDIOGRAM COMPLETE
Area-P 1/2: 2.76 cm2
S' Lateral: 3.3 cm

## 2020-06-17 ENCOUNTER — Inpatient Hospital Stay
Admission: EM | Admit: 2020-06-17 | Discharge: 2020-07-11 | DRG: 208 | Disposition: E | Payer: Medicare Other | Attending: Internal Medicine | Admitting: Internal Medicine

## 2020-06-17 ENCOUNTER — Emergency Department: Payer: Medicare Other

## 2020-06-17 ENCOUNTER — Observation Stay: Payer: Medicare Other

## 2020-06-17 ENCOUNTER — Other Ambulatory Visit: Payer: Self-pay

## 2020-06-17 ENCOUNTER — Encounter: Payer: Self-pay | Admitting: Medical Oncology

## 2020-06-17 DIAGNOSIS — R0602 Shortness of breath: Secondary | ICD-10-CM | POA: Diagnosis not present

## 2020-06-17 DIAGNOSIS — F32A Depression, unspecified: Secondary | ICD-10-CM | POA: Diagnosis present

## 2020-06-17 DIAGNOSIS — J411 Mucopurulent chronic bronchitis: Secondary | ICD-10-CM

## 2020-06-17 DIAGNOSIS — Z888 Allergy status to other drugs, medicaments and biological substances status: Secondary | ICD-10-CM | POA: Diagnosis not present

## 2020-06-17 DIAGNOSIS — E440 Moderate protein-calorie malnutrition: Secondary | ICD-10-CM | POA: Diagnosis not present

## 2020-06-17 DIAGNOSIS — J189 Pneumonia, unspecified organism: Principal | ICD-10-CM | POA: Diagnosis present

## 2020-06-17 DIAGNOSIS — Z882 Allergy status to sulfonamides status: Secondary | ICD-10-CM

## 2020-06-17 DIAGNOSIS — J449 Chronic obstructive pulmonary disease, unspecified: Secondary | ICD-10-CM | POA: Diagnosis present

## 2020-06-17 DIAGNOSIS — Z7951 Long term (current) use of inhaled steroids: Secondary | ICD-10-CM

## 2020-06-17 DIAGNOSIS — R451 Restlessness and agitation: Secondary | ICD-10-CM | POA: Diagnosis present

## 2020-06-17 DIAGNOSIS — I700 Atherosclerosis of aorta: Secondary | ICD-10-CM

## 2020-06-17 DIAGNOSIS — R0609 Other forms of dyspnea: Secondary | ICD-10-CM | POA: Diagnosis present

## 2020-06-17 DIAGNOSIS — Z4659 Encounter for fitting and adjustment of other gastrointestinal appliance and device: Secondary | ICD-10-CM

## 2020-06-17 DIAGNOSIS — R059 Cough, unspecified: Secondary | ICD-10-CM | POA: Diagnosis not present

## 2020-06-17 DIAGNOSIS — Z955 Presence of coronary angioplasty implant and graft: Secondary | ICD-10-CM

## 2020-06-17 DIAGNOSIS — Z28310 Unvaccinated for covid-19: Secondary | ICD-10-CM

## 2020-06-17 DIAGNOSIS — I10 Essential (primary) hypertension: Secondary | ICD-10-CM | POA: Diagnosis present

## 2020-06-17 DIAGNOSIS — Z20822 Contact with and (suspected) exposure to covid-19: Secondary | ICD-10-CM | POA: Diagnosis not present

## 2020-06-17 DIAGNOSIS — J440 Chronic obstructive pulmonary disease with acute lower respiratory infection: Secondary | ICD-10-CM | POA: Diagnosis present

## 2020-06-17 DIAGNOSIS — Z951 Presence of aortocoronary bypass graft: Secondary | ICD-10-CM | POA: Diagnosis not present

## 2020-06-17 DIAGNOSIS — Z79899 Other long term (current) drug therapy: Secondary | ICD-10-CM

## 2020-06-17 DIAGNOSIS — I2510 Atherosclerotic heart disease of native coronary artery without angina pectoris: Secondary | ICD-10-CM | POA: Diagnosis not present

## 2020-06-17 DIAGNOSIS — Z515 Encounter for palliative care: Secondary | ICD-10-CM | POA: Diagnosis not present

## 2020-06-17 DIAGNOSIS — R531 Weakness: Secondary | ICD-10-CM | POA: Diagnosis not present

## 2020-06-17 DIAGNOSIS — R34 Anuria and oliguria: Secondary | ICD-10-CM | POA: Diagnosis not present

## 2020-06-17 DIAGNOSIS — Z452 Encounter for adjustment and management of vascular access device: Secondary | ICD-10-CM

## 2020-06-17 DIAGNOSIS — T466X5A Adverse effect of antihyperlipidemic and antiarteriosclerotic drugs, initial encounter: Secondary | ICD-10-CM | POA: Diagnosis not present

## 2020-06-17 DIAGNOSIS — F419 Anxiety disorder, unspecified: Secondary | ICD-10-CM | POA: Diagnosis present

## 2020-06-17 DIAGNOSIS — Z6825 Body mass index (BMI) 25.0-25.9, adult: Secondary | ICD-10-CM

## 2020-06-17 DIAGNOSIS — J9621 Acute and chronic respiratory failure with hypoxia: Secondary | ICD-10-CM | POA: Diagnosis not present

## 2020-06-17 DIAGNOSIS — G9341 Metabolic encephalopathy: Secondary | ICD-10-CM | POA: Diagnosis present

## 2020-06-17 DIAGNOSIS — I255 Ischemic cardiomyopathy: Secondary | ICD-10-CM | POA: Diagnosis not present

## 2020-06-17 DIAGNOSIS — T17500A Unspecified foreign body in bronchus causing asphyxiation, initial encounter: Secondary | ICD-10-CM

## 2020-06-17 DIAGNOSIS — J441 Chronic obstructive pulmonary disease with (acute) exacerbation: Secondary | ICD-10-CM | POA: Diagnosis not present

## 2020-06-17 DIAGNOSIS — G720 Drug-induced myopathy: Secondary | ICD-10-CM | POA: Diagnosis not present

## 2020-06-17 DIAGNOSIS — Z8249 Family history of ischemic heart disease and other diseases of the circulatory system: Secondary | ICD-10-CM

## 2020-06-17 DIAGNOSIS — F17210 Nicotine dependence, cigarettes, uncomplicated: Secondary | ICD-10-CM | POA: Diagnosis not present

## 2020-06-17 DIAGNOSIS — Z66 Do not resuscitate: Secondary | ICD-10-CM | POA: Diagnosis not present

## 2020-06-17 DIAGNOSIS — R509 Fever, unspecified: Secondary | ICD-10-CM | POA: Diagnosis not present

## 2020-06-17 DIAGNOSIS — Z7902 Long term (current) use of antithrombotics/antiplatelets: Secondary | ICD-10-CM

## 2020-06-17 DIAGNOSIS — Z01818 Encounter for other preprocedural examination: Secondary | ICD-10-CM

## 2020-06-17 DIAGNOSIS — J9601 Acute respiratory failure with hypoxia: Secondary | ICD-10-CM

## 2020-06-17 DIAGNOSIS — R627 Adult failure to thrive: Secondary | ICD-10-CM | POA: Diagnosis present

## 2020-06-17 DIAGNOSIS — I252 Old myocardial infarction: Secondary | ICD-10-CM | POA: Diagnosis not present

## 2020-06-17 DIAGNOSIS — E785 Hyperlipidemia, unspecified: Secondary | ICD-10-CM | POA: Diagnosis present

## 2020-06-17 LAB — CBC WITH DIFFERENTIAL/PLATELET
Abs Immature Granulocytes: 0.03 10*3/uL (ref 0.00–0.07)
Basophils Absolute: 0.1 10*3/uL (ref 0.0–0.1)
Basophils Relative: 1 %
Eosinophils Absolute: 0 10*3/uL (ref 0.0–0.5)
Eosinophils Relative: 0 %
HCT: 50.8 % (ref 39.0–52.0)
Hemoglobin: 17.5 g/dL — ABNORMAL HIGH (ref 13.0–17.0)
Immature Granulocytes: 0 %
Lymphocytes Relative: 14 %
Lymphs Abs: 1.2 10*3/uL (ref 0.7–4.0)
MCH: 32.1 pg (ref 26.0–34.0)
MCHC: 34.4 g/dL (ref 30.0–36.0)
MCV: 93.2 fL (ref 80.0–100.0)
Monocytes Absolute: 0.7 10*3/uL (ref 0.1–1.0)
Monocytes Relative: 8 %
Neutro Abs: 7 10*3/uL (ref 1.7–7.7)
Neutrophils Relative %: 77 %
Platelets: 247 10*3/uL (ref 150–400)
RBC: 5.45 MIL/uL (ref 4.22–5.81)
RDW: 12.4 % (ref 11.5–15.5)
WBC: 9.1 10*3/uL (ref 4.0–10.5)
nRBC: 0 % (ref 0.0–0.2)

## 2020-06-17 LAB — BLOOD GAS, VENOUS
Acid-Base Excess: 4.2 mmol/L — ABNORMAL HIGH (ref 0.0–2.0)
Bicarbonate: 28.4 mmol/L — ABNORMAL HIGH (ref 20.0–28.0)
O2 Saturation: 90 %
Patient temperature: 37
pCO2, Ven: 40 mmHg — ABNORMAL LOW (ref 44.0–60.0)
pH, Ven: 7.46 — ABNORMAL HIGH (ref 7.250–7.430)
pO2, Ven: 55 mmHg — ABNORMAL HIGH (ref 32.0–45.0)

## 2020-06-17 LAB — BASIC METABOLIC PANEL
Anion gap: 10 (ref 5–15)
BUN: 15 mg/dL (ref 8–23)
CO2: 26 mmol/L (ref 22–32)
Calcium: 9.8 mg/dL (ref 8.9–10.3)
Chloride: 99 mmol/L (ref 98–111)
Creatinine, Ser: 0.81 mg/dL (ref 0.61–1.24)
GFR, Estimated: >60 mL/min (ref >60)
Glucose, Bld: 105 mg/dL — ABNORMAL HIGH (ref 70–99)
Potassium: 4.4 mmol/L (ref 3.5–5.1)
Sodium: 135 mmol/L (ref 135–145)

## 2020-06-17 LAB — RESP PANEL BY RT-PCR (FLU A&B, COVID) ARPGX2
Influenza A by PCR: NEGATIVE
Influenza B by PCR: NEGATIVE
SARS Coronavirus 2 by RT PCR: NEGATIVE

## 2020-06-17 MED ORDER — ALPRAZOLAM 0.25 MG PO TABS
0.2500 mg | ORAL_TABLET | Freq: Once | ORAL | Status: AC
  Administered 2020-06-17: 0.25 mg via ORAL
  Filled 2020-06-17: qty 1

## 2020-06-17 MED ORDER — PANTOPRAZOLE SODIUM 40 MG PO TBEC
40.0000 mg | DELAYED_RELEASE_TABLET | Freq: Every day | ORAL | Status: DC
  Administered 2020-06-18 – 2020-06-22 (×5): 40 mg via ORAL
  Filled 2020-06-17 (×6): qty 1

## 2020-06-17 MED ORDER — ACETAMINOPHEN 325 MG PO TABS
650.0000 mg | ORAL_TABLET | Freq: Four times a day (QID) | ORAL | Status: AC | PRN
  Administered 2020-06-17 – 2020-06-20 (×2): 650 mg via ORAL
  Filled 2020-06-17 (×2): qty 2

## 2020-06-17 MED ORDER — FLUTICASONE-UMECLIDIN-VILANT 100-62.5-25 MCG/INH IN AEPB: 1.0000 | INHALATION_SPRAY | Freq: Every day | RESPIRATORY_TRACT | Status: DC

## 2020-06-17 MED ORDER — MAGNESIUM OXIDE 400 (241.3 MG) MG PO TABS
200.0000 mg | ORAL_TABLET | Freq: Every day | ORAL | Status: DC
  Administered 2020-06-18 – 2020-06-23 (×6): 200 mg via ORAL
  Filled 2020-06-17 (×6): qty 1

## 2020-06-17 MED ORDER — IPRATROPIUM-ALBUTEROL 0.5-2.5 (3) MG/3ML IN SOLN: 3.0000 mL | RESPIRATORY_TRACT | Status: DC | PRN

## 2020-06-17 MED ORDER — FLUTICASONE FUROATE-VILANTEROL 200-25 MCG/INH IN AEPB
1.0000 | INHALATION_SPRAY | Freq: Every day | RESPIRATORY_TRACT | Status: DC
  Filled 2020-06-17: qty 28

## 2020-06-17 MED ORDER — ALBUTEROL SULFATE HFA 108 (90 BASE) MCG/ACT IN AERS
2.0000 | INHALATION_SPRAY | Freq: Four times a day (QID) | RESPIRATORY_TRACT | Status: DC | PRN
  Filled 2020-06-17: qty 6.7

## 2020-06-17 MED ORDER — DM-GUAIFENESIN ER 30-600 MG PO TB12
2.0000 | ORAL_TABLET | Freq: Every day | ORAL | Status: DC
  Administered 2020-06-17 – 2020-06-18 (×2): 2 via ORAL
  Filled 2020-06-17 (×3): qty 2

## 2020-06-17 MED ORDER — IPRATROPIUM-ALBUTEROL 0.5-2.5 (3) MG/3ML IN SOLN
3.0000 mL | Freq: Once | RESPIRATORY_TRACT | Status: AC
  Administered 2020-06-17: 3 mL via RESPIRATORY_TRACT
  Filled 2020-06-17: qty 3

## 2020-06-17 MED ORDER — UMECLIDINIUM BROMIDE 62.5 MCG/INH IN AEPB: 1.0000 | INHALATION_SPRAY | Freq: Every day | RESPIRATORY_TRACT | Status: DC

## 2020-06-17 MED ORDER — CLOPIDOGREL BISULFATE 75 MG PO TABS
75.0000 mg | ORAL_TABLET | Freq: Every day | ORAL | Status: DC
  Administered 2020-06-18 – 2020-06-23 (×6): 75 mg via ORAL
  Filled 2020-06-17 (×6): qty 1

## 2020-06-17 MED ORDER — SODIUM CHLORIDE 0.9 % IV SOLN
500.0000 mg | Freq: Once | INTRAVENOUS | Status: AC
  Administered 2020-06-17: 500 mg via INTRAVENOUS
  Filled 2020-06-17: qty 500

## 2020-06-17 MED ORDER — NITROGLYCERIN 0.4 MG/SPRAY TL SOLN
1.0000 | Status: DC | PRN
  Filled 2020-06-17: qty 4.9

## 2020-06-17 MED ORDER — SODIUM CHLORIDE 0.9 % IV SOLN
500.0000 mg | INTRAVENOUS | Status: DC
  Filled 2020-06-17: qty 500

## 2020-06-17 MED ORDER — PREDNISONE 10 MG PO TABS: 5.0000 mg | ORAL_TABLET | Freq: Every day | ORAL | Status: DC

## 2020-06-17 MED ORDER — ONDANSETRON HCL 4 MG PO TABS: 4.0000 mg | ORAL_TABLET | Freq: Four times a day (QID) | ORAL | Status: DC | PRN

## 2020-06-17 MED ORDER — ENOXAPARIN SODIUM 40 MG/0.4ML ~~LOC~~ SOLN
40.0000 mg | SUBCUTANEOUS | Status: DC
  Administered 2020-06-17 – 2020-06-22 (×6): 40 mg via SUBCUTANEOUS
  Filled 2020-06-17 (×6): qty 0.4

## 2020-06-17 MED ORDER — IPRATROPIUM-ALBUTEROL 0.5-2.5 (3) MG/3ML IN SOLN
3.0000 mL | Freq: Four times a day (QID) | RESPIRATORY_TRACT | Status: DC
  Administered 2020-06-17 (×2): 3 mL via RESPIRATORY_TRACT
  Filled 2020-06-17 (×2): qty 3

## 2020-06-17 MED ORDER — DULOXETINE HCL 30 MG PO CPEP
60.0000 mg | ORAL_CAPSULE | Freq: Every day | ORAL | Status: DC
  Administered 2020-06-18 – 2020-06-21 (×4): 60 mg via ORAL
  Filled 2020-06-17 (×5): qty 2

## 2020-06-17 MED ORDER — SODIUM CHLORIDE 0.9 % IV SOLN
2.0000 g | Freq: Once | INTRAVENOUS | Status: AC
  Administered 2020-06-17: 2 g via INTRAVENOUS
  Filled 2020-06-17: qty 20

## 2020-06-17 MED ORDER — ACETAMINOPHEN 650 MG RE SUPP: 650.0000 mg | Freq: Four times a day (QID) | RECTAL | Status: AC | PRN

## 2020-06-17 MED ORDER — SODIUM CHLORIDE 0.9 % IV SOLN
1.0000 g | INTRAVENOUS | Status: DC
  Filled 2020-06-17: qty 10

## 2020-06-17 MED ORDER — IOHEXOL 350 MG/ML SOLN
75.0000 mL | Freq: Once | INTRAVENOUS | Status: AC | PRN
  Administered 2020-06-17: 75 mL via INTRAVENOUS

## 2020-06-17 MED ORDER — ONDANSETRON HCL 4 MG/2ML IJ SOLN: 4.0000 mg | Freq: Four times a day (QID) | INTRAMUSCULAR | Status: DC | PRN

## 2020-06-17 MED ORDER — IRBESARTAN 150 MG PO TABS
75.0000 mg | ORAL_TABLET | Freq: Every day | ORAL | Status: DC
  Administered 2020-06-17 – 2020-06-23 (×6): 75 mg via ORAL
  Filled 2020-06-17 (×7): qty 1

## 2020-06-17 MED ORDER — IPRATROPIUM-ALBUTEROL 0.5-2.5 (3) MG/3ML IN SOLN
3.0000 mL | Freq: Four times a day (QID) | RESPIRATORY_TRACT | Status: DC
  Administered 2020-06-18 (×2): 3 mL via RESPIRATORY_TRACT
  Filled 2020-06-17 (×2): qty 3

## 2020-06-17 NOTE — H&P (Addendum)
History and Physical   Benjamin Robertson FAO:[id number] DOB: June 13, 1950 DOA: 07/04/2020  PCP: Venia Carbon, MD  Outpatient Specialists: Dr. Vella Kohler  Patient coming from: home   I have personally briefly reviewed patient's old medical records in [place].  Chief Concern: shortness of breath   HPI: Benjamin Robertson is a 70 y.o. male with medical history significant for 2 COPD stage I/II, history of tobacco use, CAD status post CABG x3 after PCI x2 to the RCA (lima-lad, svg-diagonal svg-ri), grafts to the ramus intermedius, and LAD-diagonal as well as stents in the native RCA and left main circumflex, hypertension, depression/anxiety, presents to the emergency department for chief concerns of shortness of breath.  Patient states that he has been having shortness of breath for 3 days.  He states the shortness of breath is worse with exertion.  He denies lower extremity swelling and/or weight gain.  He states the cough is productive.  He states the cough produces clear mucus. Normally, he sleeps with one pillow. He has been experiencing shortness of breath with exertion.  He states that when he lays down at night he coughs a lot and this prevents him from sleeping on the bed.  He has been sleeping on a recliner. He endorses feeling weak and does not have energy in the last 3 days.  He denies fever and sick contacts.  He endorses an unintentional 40 pound weight loss.  He is compliant with COPD and Plavix for CAD.  Patient and spouse endorses that patient has not missed any Plavix dose.  Patient and spouse at bedside states that he was told by cardiologist to stop aspirin due to increased bruising and bleeding.  Social history: He lives at home with his wife.  He is a former tobacco user, quit smoking 1 week ago.  At his peak, he was smoking 1 pack per week.  He is currently retired and formerly he worked as a Scientist, water quality.   Vaccination: not vaccinated for covid  19  ROS: Constitutional: no weight change, no fever ENT/Mouth: no sore throat, no rhinorrhea Eyes: no eye pain, no vision changes Cardiovascular: no chest pain, + dyspnea,  no edema, no palpitations Respiratory: no cough, no sputum, no wheezing Gastrointestinal: no nausea, no vomiting, no diarrhea, no constipation Genitourinary: no urinary incontinence, no dysuria, no hematuria Musculoskeletal: no arthralgias, no myalgias Skin: no skin lesions, no pruritus, Neuro: + weakness, no loss of consciousness, no syncope Psych: no anxiety, no depression, + decrease appetite Heme/Lymph: no bruising, no bleeding  ED Course: Discussed with emergency medicine provider, patient requiring hospitalization due to mild hypoxia in setting of COPD and now with right lung base pneumonia.  Vitals in the emergency department was remarkable for temperature of 98.3, respiration rate of 18, heart rate of 83, blood pressure 162/89, patient SPO2 saturation at 93% on 2 L nasal cannula.  VBG performed in the emergency department was remarkable for 7.4 6/40/55  Labs in the emergency department showed sodium level of 135, potassium 4.4, chloride 99, bicarb 26, BUN 15, serum creatinine of 0.81, nonfasting blood glucose 105, WBC 9.1, hemoglobin 17.5, hematocrit 50.8, platelets 247.  ED provider gave patient 1 dose of azithromycin IV and ceftriaxone IV and 1 treatment of DuoNeb's.  Assessment/Plan  Principal Problem:   CAP (community acquired pneumonia) Active Problems:   Cigarette smoker one half pack a day or less   Essential hypertension   Chronic dyspnea   COPD (chronic obstructive pulmonary disease) ([place])  Statin myopathy   # Shortness of breath-etiology work-up in progress including community-acquired pneumonia versus pulmonary embolism -Low clinical suspicion for ACS given patient's recent heart echo and left heart cath -Continue 2 L nasal cannula -Suspect right lower lobe pneumonia -Ceftriaxone and  azithromycin IV per ED provider, we will continue this -Status post 1 treatment of duo nebs per ED provider -Scheduled duo nebs every 6 hours while awake, chest physical therapy every 4 hours while awake -Incentive spirometry, flutter valve -Dextromethorphan-guaifenesin 30-600 mg tablet, 2 tablets daily scheduled -CTA of the chest to assess for pulmonary embolism  # COPD stage I/II-resumed home trilogy daily, prednisone 5 mg daily, albuterol, 2 puffs inhalation every 6 hours as needed for wheezing and shortness of breath  # CAD-continue Plavix 75 mg daily indefinitely -Resumed ARB, irbesartan 75 mg daily -Per cardiology, patient is no longer on aspirin due to increased bruising and he has been weaned off of beta-blockade, and does not require diuretics -Previously patient was on PCSK9 inhibitor, Repatha, and this was stopped in January 2022 due to complaints of ongoing shortness of breath and tightness in the chest on his off and this was associated with Repatha shots  # 40 pound weight loss-unintentional query COPD cachexia versus depression -Muscle mass loss -Outpatient follow-up with PCP and/or pulmonologist -Patient has had work-up outpatient and TSH and all evaluations have been negative with testosterone borderline low -Per patient he had a colonoscopy approximately 3 years ago and was told to return in 10 years -Patient may benefit from either outpatient pulmonary or cardiac rehab, follow-up with outpatient provider for referral including PCP, cardiologist, or pulmonologist  # Depression/anxiety-resumed duloxetine 60 mg daily  # Hypertension-elevated as patient did not take his blood pressure medicine today, resumed home irbesartan 75 mg daily  # History of tobacco use-patient has quit smoking and has not had a cigarette in 1 week  Chart reviewed.   Extensive coronary artery disease history with history of CABG x3 and multiple PCI's, was recommended for lifelong Plavix and no  longer aspirin due to increased bruising and bleeding.  Per cardiology note on 05/17/2020, Dr. Glenetta Hew, prefer to not hold Plavix until after September 2022 for any procedures.  Beta-blockade was weaned off due to increased fatigue, per cardiology note no diuretic requirement  Echocardiogram on 06/09/2020: EF of 55 to 60%, mild inferior hypokinesis, grade 1 diastolic dysfunction.  Left heart cath on 11/18/2019 for a relook: RCA stent widely patent, PCI to ostial circumflex, balloon angioplasty to previously placed ostial stent.  From Dr. Ellyn Hack note:   1994-inferior STEMI PTCA RCA 1997-BMS PCI RCA 2001-progressive angina, RCA stent patent and left circumflex-OM OK, ost LAD and RI disease-CABG x3 (LIMA-LAD, SVG-DI, SVG-RI), low EF 2003 patent stents and grafts 2011-Myoview moderate basal mid inferior septal and basal apical inferior infarct, no ischemia, low EF resolved  04/2014-NSTEMI: DES PCI ost left circumflex (Xience DES 3 x 15 - 3.6 mm) 11/2019-NSTEMI, 100% RCA ISR (DES PCI resolute Onyx 2.5 x30 from distal stent), 80% Cx ISR, Echo EF 55-60%, unable to fully evaluate regional wall motion abnormalities.  Grade 1 diastolic, unable to assess RV pressures but normal RAP.  Relatively normal valves.  DVT prophylaxis: Enoxaparin 40 mg subcutaneous every 24 hours Code Status: Full code Diet: Heart healthy Family Communication: Updated spouse at bedside Disposition Plan: Pending clinical course Consults called: None at this time Admission status: Observation, MedSurg, telemetry, 24 hours ordered  Past Medical History:  Diagnosis Date  . Arthritis  intermittent  . CAD in native artery 1994, 2001   a) 2001 Exertional Angina --> Myoview: Inf infarct & Ant ischemia --> Cath: 95% pLAD, 99% D1, 80% RI & Cx-OM1&2 OK , & patent RCA stent --> CABG: b) Cath 2003: patent Grafts & stents; c) 07/2014: PCI-Ost LCx (not bypassed) - Xience DES 3.0 x 15 (3.6 mm); d) 11/16/2019 (NSTEMI) -> 100% RCA ISR/T  (Res.Onyx DES 2.5 x 30 - 2.8 mm) & 80% ost LCx ISR - dLM-OstLCx (Res Onyx 3.5 x 15 - 3.8 mm)  . Childhood asthma   . Hemorrhoids    history of  . History of ST elevation myocardial infarction (STEMI) of inferior wall 1994; 1997   PCI of RCA - redo PCI BMS ML Vision 3.0 mm x 25 mm to RCA in 1997  . Hyperlipidemia LDL goal <70    At goal by Most recent labs October 2013: TC 123, TG 83, HDL 81, LDL 65  . Hypertension   . Ischemic cardiomyopathy 1994, 2003   Echo February 2014: EF 40-45%; moderate HK of anterolateral myocardium.; Grade 1 diastolic dysfunction  . Kidney stones    3 times had lithrotripsy  . Obesity (BMI 30.0-34.9)   . Pneumonia 2014  . S/P CABG x 3 2001   LIMA-LAD, SVG-D1l, SVG-(RI) - patent by Cath in 2003, RCA & LCx not Grafted   Past Surgical History:  Procedure Laterality Date  . CARDIAC CATHETERIZATION  06/06/1999   2001 Exertional Angina --> Myoview: Inf infarct and Ant ischemia --> Cath: 95% pLAD, 99% D1, 80% RI & Cx-OM1&2 OK , & patent RCA stent --> CABG  . CARDIAC CATHETERIZATION  10/04/2001   Patent grafts with a 50% lesion in LAD beyond IMA insertion  . CARDIAC CATHETERIZATION N/A 07/31/2014   Procedure: Left Heart Cath and Cors/Grafts Angiography;  Surgeon: Jettie Booze, MD;  Location: [facility];  Service: Cardiovascular;  Laterality: N/A;  . CARDIAC CATHETERIZATION N/A 07/31/2014   Procedure: Coronary Stent Intervention;  Surgeon: Jettie Booze, MD;  Location: [facility];  Service: Cardiovascular: PCI to Ostial Cx (not bypassed) - Xience DES 3.0 x 15 (3.6 mm)  . CAROTID DOPPLER  05/06/2012   Less than 50% diameter reduction of the Lft Subclavian.  . CORONARY ANGIOGRAPHY N/A 11/18/2019   Procedure: CORONARY ANGIOGRAPHY;  Surgeon: Troy Sine, MD;  Location: [facility];  Service: Cardiovascular; CONFIRMS p-mRCA stents (from 9/5); Stable Ostial Lcx 95% - for staged PCI; Ost LAD ~50% with diffuse diseaes (competetive flow from  LIMA noted), RI 80%-100%, D1 100%.   . CORONARY ANGIOPLASTY WITH STENT PLACEMENT  01/21/1996   PTCA of RCA-95% lesion/per wife 2015  . CORONARY ARTERY BYPASS GRAFT  06/07/1999   x3. LIMA to distal LAD, SVG to first diag, and SVG to ramus  . CORONARY STENT INTERVENTION N/A 11/16/2019   Procedure: CORONARY STENT INTERVENTION;  Surgeon: Troy Sine, MD;  Location: [facility];  Service: Cardiovascular;; mRCA 100% In-stent thrombosis --> Resolute Onyx DES 2.5 x 30 (overlapping prior stent & covering lesion distal to prior stent) -> post-dilated to 2.8 mm  . CORONARY STENT INTERVENTION N/A 11/18/2019   Procedure: CORONARY STENT INTERVENTION;  Surgeon: Troy Sine, MD;  Location: [facility];  Service: Cardiovascular;; 95% ostial LCx (at proximal edge of prior stent) -> DES PCI from LM-ostLCx (overlapping prior stent in LCx) - Scoring Balloon PTCA followed by Resolute Onyx DES 3.5 x 15 mm -  post-dilated 3.8 - 3.7 mm. (did not loose LAD flow)  . CORONARY/GRAFT ACUTE MI REVASCULARIZATION N/A 11/16/2019   Procedure: Coronary/Graft Acute MI Revascularization;  Surgeon: Troy Sine, MD;  Location: [facility];  Service: Cardiovascular;  --> PTCA-DES PCI mRCA 100% ISR/thrombosis  . HYDROCELE EXCISION Left 02/28/2016   Procedure: HYDROCELECTOMY ADULT;  Surgeon: Kathie Rhodes, MD;  Location: [facility];  Service: Urology;  Laterality: Left;  . LEFT HEART CATH AND CORS/GRAFTS ANGIOGRAPHY N/A 11/16/2019   Procedure: LEFT HEART CATH AND CORS/GRAFTS ANGIOGRAPHY;  Surgeon: Troy Sine, MD;  Location: [facility];  Service: Cardiovascular;; For ACS --> 100% p-mRCA In-stent Thrombosis & disease outside of stent; Ost LCx 95% at prox edge of prior stent, OM & LPL branches - OK.  Ost LAD ~50% diffuse Dz, competetive flow from LIMA: RI & D1 ~CTO; Patent LIMA-LAD, SVG-RI, SVG-D1  . LITHOTRIPSY  "2-3 times"   1988/2003/2007  . NM MYOVIEW LTD  08/19/2009   Moderafte perfusion seen in Basal  inferoseptal, Basal inferior, Mid inferoseptal, Mid inferiorm, and Apical inferior regions. No ECG changes. EKG negative for ischemia.;   . TRANSTHORACIC ECHOCARDIOGRAM  05/06/2012   EF 40-45%, LV cavity moderately dilated, systolic function mild-moderately reduced, moderate hypokinesis of anterolatewral myocardium.  . TRANSTHORACIC ECHOCARDIOGRAM  11/15/2019   (ACS- 100% RCA-PCI, 95%LCx - staged PCI): EF 55-60%, ~no RWMA, GR1 DD.  ~ Normal Valves.   Social History:  reports that he has been smoking cigarettes. He has a 12.00 pack-year smoking history. He has never used smokeless tobacco. He reports that he does not drink alcohol and does not use drugs.  Allergies  Allergen Reactions  . Brilinta [Ticagrelor] Shortness Of Breath    Having breathing issues  . Advicor [Niacin-Lovastatin Er] Other (See Comments)    Headache, possibly other reactions  . Lipitor [Atorvastatin] Other (See Comments)    Headache, possibly other reactions  . Pravachol [Pravastatin Sodium] Other (See Comments)    Headache, possibly other reactions  . Sulfa Antibiotics Other (See Comments)    Headache, possibly other reactions  . Repatha [Evolocumab] Other (See Comments)    Malaise, breathing issues  . Rosuvastatin Other (See Comments)     40 MG  TABLET--EXTREME FATIGUE  , MUSCLE  PAIN    Family History  Problem Relation Age of Onset  . Heart attack Mother   . Heart disease Mother   . Stroke Sister        during cancer Rx  . Lymphoma Sister   . Lung cancer Sister    Family history: Family history reviewed and not pertinent  Prior to Admission medications   Medication Sig Start Date End Date Taking? Authorizing Provider  albuterol (VENTOLIN HFA) 108 (90 Base) MCG/ACT inhaler Inhale 2 puffs into the lungs every 6 (six) hours as needed for wheezing or shortness of breath. 10/06/19   Venia Carbon, MD  clopidogrel (PLAVIX) 75 MG tablet Take 1 tablet (75 mg total) by mouth daily. 02/26/20   Leonie Man,  MD  DULoxetine (CYMBALTA) 60 MG capsule Take 1 capsule (60 mg total) by mouth daily. 05/07/20   Venia Carbon, MD  guaiFENesin (MUCINEX) 600 MG 12 hr tablet Take 300 mg by mouth 2 (two) times daily.    [provider]  Magnesium Oxide 250 MG TABS Take 1 tablet by mouth daily.     [provider]  nitroGLYCERIN (NITROLINGUAL) 0.4 MG/SPRAY spray PLACE 1 SPRAY UNDER THE TONGUE EVERY 5 (FIVE)  MINUTES X 3 DOSES AS NEEDED FOR CHEST PAIN. 05/31/20   Leonie Man, MD  pantoprazole (PROTONIX) 40 MG tablet Take 1 tablet (40 mg total) by mouth daily. 05/07/20   Venia Carbon, MD  predniSONE (DELTASONE) 5 MG tablet Take by mouth. 02/18/20   [provider]  TRELEGY ELLIPTA 100-62.5-25 MCG/INH AEPB Inhale 1 puff into the lungs daily. 02/18/20   [provider]  valsartan (DIOVAN) 80 MG tablet Take 1 tablet (80 mg total) by mouth daily. 05/17/20   Leonie Man, MD   Physical Exam: Vitals:   07/02/2020 1200 06/20/2020 1206 06/22/2020 1300 07/08/2020 1323  BP: (!) 162/89 (!) 162/89 (!) 154/88 (!) 154/88  Pulse: 79 83 90 84  Resp: 19 18 16 17   Temp:      TempSrc:      SpO2:  93% 94% 95%  Weight:      Height:       Constitutional: appears age appropriate, NAD, calm, comfortable Eyes: PERRL, lids and conjunctivae normal ENMT: Mucous membranes are moist. Posterior pharynx clear of any exudate or lesions. Age-appropriate dentition. Hearing appropriate Neck: normal, supple, no masses, no thyromegaly Respiratory: right anterior mid and lower lung base crackles. No wheezing. Normal respiratory effort. No accessory muscle use.  Cardiovascular: Regular rate and rhythm, no murmurs / rubs / gallops. No extremity edema. 2+ pedal pulses. No carotid bruits.  Abdomen: no tenderness, no masses palpated, no hepatosplenomegaly. Bowel sounds positive.  Musculoskeletal: no clubbing / cyanosis. No joint deformity upper and lower extremities. Good ROM, no contractures, no atrophy. Normal  muscle tone.  Skin: no rashes, lesions, ulcers. No induration Neurologic: Sensation intact. Strength 5/5 in all 4.  Psychiatric: Normal judgment and insight. Alert and oriented x 3. Normal mood.   EKG: independently reviewed, showing sinus rhythm with rate of 83, QTc 429  Chest x-ray on Admission: I personally reviewed and I agree with radiologist reading as below.  DG Chest 2 View  Result Date: 06/18/2020 CLINICAL DATA:  Pt reports 2 days of worsening sob. Hx of COPD. Denies fever.Pt says he has phlegm down in his throat and air way that he cannot clear out. Pt has a weak cough and denies cp. Pts wife said he had a nebulizer treatment at home yesterday but it gave no relief to his SOB.Hx. COPD, heart stents EXAM: CHEST - 2 VIEW COMPARISON:  11/14/2019.  Chest CT, 03/02/2020. FINDINGS: Stable changes from prior CABG surgery. Cardiac silhouette is normal in size. No mediastinal or hilar masses. No evidence of adenopathy. Opacity at the right lung base partly silhouettes the hemidiaphragm. This is consistent with atelectasis or pneumonia. Lateral view suggests a small effusion on the right. Linear opacity noted at the posterior base of the left lower lobe consistent with atelectasis. Remainder of the lungs is clear. No left pleural effusion. No pneumothorax. Skeletal structures are demineralized but grossly intact. IMPRESSION: 1. Right lung base opacity consistent with either atelectasis or pneumonia combination with a probable small effusion. 2. No other evidence of acute cardiopulmonary disease. No pulmonary edema. 3. Stable changes from previous CABG surgery. Electronically Signed   By: Lajean Manes M.D.   On: 06/25/2020 12:11   Labs on Admission: I have personally reviewed following labs  CBC: Recent Labs  Lab 07/05/2020 1157  WBC 9.1  NEUTROABS 7.0  HGB 17.5*  HCT 50.8  MCV 93.2  PLT 983   Basic Metabolic Panel: Recent Labs  Lab 06/27/2020 1157  NA 135  K  4.4  CL 99  CO2 26  GLUCOSE  105*  BUN 15  CREATININE 0.81  CALCIUM 9.8   GFR: Estimated Creatinine Clearance: 84.4 mL/min (by C-G formula based on SCr of 0.81 mg/dL).  Urine analysis:    Component Value Date/Time   BILIRUBINUR negative 07/22/2019 0850   PROTEINUR Negative 07/22/2019 0850   UROBILINOGEN 0.2 07/22/2019 0850   NITRITE negative 07/22/2019 0850   LEUKOCYTESUR Small (1+) (A) 07/22/2019 0850   Sabriel Borromeo N Karole Oo D.O. [facility]  If 7PM-7AM, please contact overnight-coverage provider If 7AM-7PM, please contact day coverage provider [email or web address]  07/04/2020, 1:26 PM

## 2020-06-17 NOTE — ED Notes (Signed)
Continuous cardiac and pulse ox monitoring.

## 2020-06-17 NOTE — ED Triage Notes (Signed)
Pt reports 2 days of worsening sob. Hx of COPD. Denies fever.

## 2020-06-17 NOTE — ED Provider Notes (Signed)
[facility] Emergency Department Provider Note  ____________________________________________  Time seen: Approximately 12:22 PM  I have reviewed the triage vital signs and the nursing notes.   HISTORY  Chief Complaint Shortness of Breath    HPI Benjamin Robertson is a 70 y.o. male with a history of CAD, COPD who comes ED complaining of worsening shortness of breath for the past 2 days associated with feeling of chest congestion.  He has cough which is nonproductive.  No fever or chills.  Shortness of breath is severe, worse with walking even a few feet, no alleviating factors.  Denies chest pain.      Past Medical History:  Diagnosis Date  . Arthritis    intermittent  . CAD in native artery 1994, 2001   a) 2001 Exertional Angina --> Myoview: Inf infarct & Ant ischemia --> Cath: 95% pLAD, 99% D1, 80% RI & Cx-OM1&2 OK , & patent RCA stent --> CABG: b) Cath 2003: patent Grafts & stents; c) 07/2014: PCI-Ost LCx (not bypassed) - Xience DES 3.0 x 15 (3.6 mm); d) 11/16/2019 (NSTEMI) -> 100% RCA ISR/T (Res.Onyx DES 2.5 x 30 - 2.8 mm) & 80% ost LCx ISR - dLM-OstLCx (Res Onyx 3.5 x 15 - 3.8 mm)  . Childhood asthma   . Hemorrhoids    history of  . History of ST elevation myocardial infarction (STEMI) of inferior wall 1994; 1997   PCI of RCA - redo PCI BMS ML Vision 3.0 mm x 25 mm to RCA in 1997  . Hyperlipidemia LDL goal <70    At goal by Most recent labs October 2013: TC 123, TG 83, HDL 81, LDL 65  . Hypertension   . Ischemic cardiomyopathy 1994, 2003   Echo February 2014: EF 40-45%; moderate HK of anterolateral myocardium.; Grade 1 diastolic dysfunction  . Kidney stones    3 times had lithrotripsy  . Obesity (BMI 30.0-34.9)   . Pneumonia 2014  . S/P CABG x 3 2001   LIMA-LAD, SVG-D1l, SVG-(RI) - patent by Cath in 2003, RCA & LCx not Grafted     Patient Active Problem List   Diagnosis Date Noted  . Malnutrition of mild degree ([place]) 05/07/2020  . Statin myopathy  03/14/2020  . Mood disorder ([place]) 03/03/2020  . Dysphagia 03/03/2020  . Aortic atherosclerosis ([place]) 03/03/2020  . Tobacco abuse 11/19/2019  . Preventative health care 12/05/2017  . History of kidney stones 12/05/2017  . Advance directive discussed with patient 12/05/2017  . Chronic dyspnea 05/30/2017  . COPD (chronic obstructive pulmonary disease) ([place]) 05/30/2017  . CAD- BMS PCI RCA ('97) - overlapping DES PCI for IST ('21); DES PCI Ost LCx ('16) - Overlapping DES LM-LCx for ISR ('21) 07/31/2014  . Presence of drug coated stent in left circumflex coronary artery 07/31/2014    Class: Status post  . Essential hypertension 12/13/2013  . Left anterior fascicular block   . Ischemic cardiomyopathy   . Hyperlipidemia with target LDL less than 70   . Cigarette smoker one half pack a day or less   . Obesity (BMI 30.0-34.9)   . CAD in native artery - status post CABG x3 after PCI x2 to the RCA (LIMA-LAD, SVG-diagonal SVG-RI 03/16/1992     Past Surgical History:  Procedure Laterality Date  . CARDIAC CATHETERIZATION  06/06/1999   2001 Exertional Angina --> Myoview: Inf infarct and Ant ischemia --> Cath: 95% pLAD, 99% D1, 80% RI & Cx-OM1&2 OK , & patent RCA stent --> CABG  .  CARDIAC CATHETERIZATION  10/04/2001   Patent grafts with a 50% lesion in LAD beyond IMA insertion  . CARDIAC CATHETERIZATION N/A 07/31/2014   Procedure: Left Heart Cath and Cors/Grafts Angiography;  Surgeon: Jettie Booze, MD;  Location: [place] [facility];  Service: Cardiovascular;  Laterality: N/A;  . CARDIAC CATHETERIZATION N/A 07/31/2014   Procedure: Coronary Stent Intervention;  Surgeon: Jettie Booze, MD;  Location: [place] [facility];  Service: Cardiovascular: PCI to Ostial Cx (not bypassed) - Xience DES 3.0 x 15 (3.6 mm)  . CAROTID DOPPLER  05/06/2012   Less than 50% diameter reduction of the Lft Subclavian.  . CORONARY ANGIOGRAPHY N/A 11/18/2019   Procedure: CORONARY ANGIOGRAPHY;  Surgeon: Troy Sine,  MD;  Location: [place] [facility];  Service: Cardiovascular; CONFIRMS p-mRCA stents (from 9/5); Stable Ostial Lcx 95% - for staged PCI; Ost LAD ~50% with diffuse diseaes (competetive flow from LIMA noted), RI 80%-100%, D1 100%.   . CORONARY ANGIOPLASTY WITH STENT PLACEMENT  01/21/1996   PTCA of RCA-95% lesion/per wife 2015  . CORONARY ARTERY BYPASS GRAFT  06/07/1999   x3. LIMA to distal LAD, SVG to first diag, and SVG to ramus  . CORONARY STENT INTERVENTION N/A 11/16/2019   Procedure: CORONARY STENT INTERVENTION;  Surgeon: Troy Sine, MD;  Location: [place] [facility];  Service: Cardiovascular;; mRCA 100% In-stent thrombosis --> Resolute Onyx DES 2.5 x 30 (overlapping prior stent & covering lesion distal to prior stent) -> post-dilated to 2.8 mm  . CORONARY STENT INTERVENTION N/A 11/18/2019   Procedure: CORONARY STENT INTERVENTION;  Surgeon: Troy Sine, MD;  Location: [place] [facility];  Service: Cardiovascular;; 95% ostial LCx (at proximal edge of prior stent) -> DES PCI from LM-ostLCx (overlapping prior stent in LCx) - Scoring Balloon PTCA followed by Resolute Onyx DES 3.5 x 15 mm - post-dilated 3.8 - 3.7 mm. (did not loose LAD flow)  . CORONARY/GRAFT ACUTE MI REVASCULARIZATION N/A 11/16/2019   Procedure: Coronary/Graft Acute MI Revascularization;  Surgeon: Troy Sine, MD;  Location: [place] [facility];  Service: Cardiovascular;  --> PTCA-DES PCI mRCA 100% ISR/thrombosis  . HYDROCELE EXCISION Left 02/28/2016   Procedure: HYDROCELECTOMY ADULT;  Surgeon: Kathie Rhodes, MD;  Location: [facility];  Service: Urology;  Laterality: Left;  . LEFT HEART CATH AND CORS/GRAFTS ANGIOGRAPHY N/A 11/16/2019   Procedure: LEFT HEART CATH AND CORS/GRAFTS ANGIOGRAPHY;  Surgeon: Troy Sine, MD;  Location: [place] [facility];  Service: Cardiovascular;; For ACS --> 100% p-mRCA In-stent Thrombosis & disease outside of stent; Ost LCx 95% at prox edge of prior stent, OM & LPL branches - OK.  Ost LAD ~50% diffuse Dz,  competetive flow from LIMA: RI & D1 ~CTO; Patent LIMA-LAD, SVG-RI, SVG-D1  . LITHOTRIPSY  "2-3 times"   1988/2003/2007  . NM MYOVIEW LTD  08/19/2009   Moderafte perfusion seen in Basal inferoseptal, Basal inferior, Mid inferoseptal, Mid inferiorm, and Apical inferior regions. No ECG changes. EKG negative for ischemia.;   . TRANSTHORACIC ECHOCARDIOGRAM  05/06/2012   EF 40-45%, LV cavity moderately dilated, systolic function mild-moderately reduced, moderate hypokinesis of anterolatewral myocardium.  . TRANSTHORACIC ECHOCARDIOGRAM  11/15/2019   (ACS- 100% RCA-PCI, 95%LCx - staged PCI): EF 55-60%, ~no RWMA, GR1 DD.  ~ Normal Valves.     Prior to Admission medications   Medication Sig Start Date End Date Taking? Authorizing Provider  albuterol (VENTOLIN HFA) 108 (90 Base) MCG/ACT inhaler Inhale 2 puffs into the lungs every 6 (six) hours  as needed for wheezing or shortness of breath. 10/06/19   Venia Carbon, MD  clopidogrel (PLAVIX) 75 MG tablet Take 1 tablet (75 mg total) by mouth daily. 02/26/20   Leonie Man, MD  DULoxetine (CYMBALTA) 60 MG capsule Take 1 capsule (60 mg total) by mouth daily. 05/07/20   Venia Carbon, MD  guaiFENesin (MUCINEX) 600 MG 12 hr tablet Take 300 mg by mouth 2 (two) times daily.    [provider]  Magnesium Oxide 250 MG TABS Take 1 tablet by mouth daily.     [provider]  nitroGLYCERIN (NITROLINGUAL) 0.4 MG/SPRAY spray PLACE 1 SPRAY UNDER THE TONGUE EVERY 5 (FIVE) MINUTES X 3 DOSES AS NEEDED FOR CHEST PAIN. 05/31/20   Leonie Man, MD  pantoprazole (PROTONIX) 40 MG tablet Take 1 tablet (40 mg total) by mouth daily. 05/07/20   Venia Carbon, MD  predniSONE (DELTASONE) 5 MG tablet Take by mouth. 02/18/20   [provider]  TRELEGY ELLIPTA 100-62.5-25 MCG/INH AEPB Inhale 1 puff into the lungs daily. 02/18/20   [provider]  valsartan (DIOVAN) 80 MG tablet Take 1 tablet (80 mg total) by mouth daily. 05/17/20   Leonie Man, MD     Allergies Brilinta [ticagrelor], Advicor [niacin-lovastatin er], Lipitor [atorvastatin], Pravachol [pravastatin sodium], Sulfa antibiotics, Repatha [evolocumab], and Rosuvastatin   Family History  Problem Relation Age of Onset  . Heart attack Mother   . Heart disease Mother   . Stroke Sister        during cancer Rx  . Lymphoma Sister   . Lung cancer Sister     Social History Social History   Tobacco Use  . Smoking status: Current Every Day Smoker    Packs/day: 0.25    Years: 48.00    Pack years: 12.00    Types: Cigarettes  . Smokeless tobacco: Never Used  . Tobacco comment: Was not able to maintain smoking cessation, -no longer has Chantix available.  Substance Use Topics  . Alcohol use: No  . Drug use: No    Review of Systems  Constitutional:   No fever or chills.  ENT:   No sore throat. No rhinorrhea. Cardiovascular:   No chest pain or syncope. Respiratory:   Positive shortness of breath and cough. Gastrointestinal:   Negative for abdominal pain, vomiting and diarrhea.  Musculoskeletal:   Negative for focal pain or swelling All other systems reviewed and are negative except as documented above in ROS and HPI.  ____________________________________________   PHYSICAL EXAM:  VITAL SIGNS: ED Triage Vitals  Enc Vitals Group     BP 06/12/2020 1058 132/90     Pulse Rate 06/23/2020 1058 96     Resp 06/26/2020 1058 20     Temp 06/22/2020 1058 98.3 F (36.8 C)     Temp Source 06/15/2020 1058 Oral     SpO2 06/18/2020 1058 92 %     Weight 07/03/2020 1059 165 lb (74.8 kg)     Height 06/16/2020 1059 5\' 8"  (1.727 m)     Head Circumference --      Peak Flow --      Pain Score 06/16/2020 1059 6     Pain Loc --      Pain Edu? --      Excl. in [place]? --     Vital signs reviewed, nursing assessments reviewed.   Constitutional:   Alert and oriented. Non-toxic appearance. Eyes:   Conjunctivae are normal. EOMI. PERRL. ENT  Head:   Normocephalic and atraumatic.       Nose:   Wearing a mask.      Mouth/Throat:   Wearing a mask.      Neck:   No meningismus. Full ROM. Hematological/Lymphatic/Immunilogical:   No cervical lymphadenopathy. Cardiovascular:   RRR. Symmetric bilateral radial and DP pulses.  No murmurs. Cap refill less than 2 seconds. Respiratory:   Normal respiratory effort without tachypnea/retractions.  Diffuse right-sided crackles.  No wheezing, normal expiratory phase. Gastrointestinal:   Soft and nontender. Non distended. There is no CVA tenderness.  No rebound, rigidity, or guarding. Genitourinary:   deferred Musculoskeletal:   Normal range of motion in all extremities. No joint effusions.  No lower extremity tenderness.  No edema. Neurologic:   Normal speech and language.  Motor grossly intact. No acute focal neurologic deficits are appreciated.  Skin:    Skin is warm, dry and intact. No rash noted.  No petechiae, purpura, or bullae.  ____________________________________________    LABS (pertinent positives/negatives) (all labs ordered are listed, but only abnormal results are displayed) Labs Reviewed  BASIC METABOLIC PANEL - Abnormal; Notable for the following components:      Result Value   Glucose, Bld 105 (*)    All other components within normal limits  CBC WITH DIFFERENTIAL/PLATELET - Abnormal; Notable for the following components:   Hemoglobin 17.5 (*)    All other components within normal limits  BLOOD GAS, VENOUS - Abnormal; Notable for the following components:   pH, Ven 7.46 (*)    pCO2, Ven 40 (*)    pO2, Ven 55.0 (*)    Bicarbonate 28.4 (*)    Acid-Base Excess 4.2 (*)    All other components within normal limits  RESP PANEL BY RT-PCR (FLU A&B, COVID) [id number]   ____________________________________________   EKG  Interpreted by me Sinus rhythm rate of 83, rightward axis.  Normal intervals.  Poor R wave progression.  Normal ST segments and T waves.  No ischemic  changes.  ____________________________________________    RADIOLOGY  DG Chest 2 View  Result Date: 06/23/2020 CLINICAL DATA:  Pt reports 2 days of worsening sob. Hx of COPD. Denies fever.Pt says he has phlegm down in his throat and air way that he cannot clear out. Pt has a weak cough and denies cp. Pts wife said he had a nebulizer treatment at home yesterday but it gave no relief to his SOB.Hx. COPD, heart stents EXAM: CHEST - 2 VIEW COMPARISON:  11/14/2019.  Chest CT, 03/02/2020. FINDINGS: Stable changes from prior CABG surgery. Cardiac silhouette is normal in size. No mediastinal or hilar masses. No evidence of adenopathy. Opacity at the right lung base partly silhouettes the hemidiaphragm. This is consistent with atelectasis or pneumonia. Lateral view suggests a small effusion on the right. Linear opacity noted at the posterior base of the left lower lobe consistent with atelectasis. Remainder of the lungs is clear. No left pleural effusion. No pneumothorax. Skeletal structures are demineralized but grossly intact. IMPRESSION: 1. Right lung base opacity consistent with either atelectasis or pneumonia combination with a probable small effusion. 2. No other evidence of acute cardiopulmonary disease. No pulmonary edema. 3. Stable changes from previous CABG surgery. Electronically Signed   By: Lajean Manes M.D.   On: 07/10/2020 12:11    ____________________________________________   PROCEDURES Procedures  ____________________________________________  DIFFERENTIAL DIAGNOSIS   COPD exacerbation, pneumonia, pleural effusion, pulmonary edema, pneumothorax  CLINICAL IMPRESSION / ASSESSMENT AND PLAN / ED COURSE  Medications ordered  in the ED: Medications  cefTRIAXone (ROCEPHIN) 2 g in sodium chloride 0.9 % 100 mL IVPB (has no administration in time range)  azithromycin (ZITHROMAX) 500 mg in sodium chloride 0.9 % 250 mL IVPB (has no administration in time range)  ipratropium-albuterol (DUONEB)  0.5-2.5 (3) MG/3ML nebulizer solution 3 mL (3 mLs Nebulization Given 06/12/2020 1157)    Pertinent labs & imaging results that were available during my care of the patient were reviewed by me and considered in my medical decision making (see chart for details).  Benjamin Robertson was evaluated in Emergency Department on 06/22/2020 for the symptoms described in the history of present illness. He was evaluated in the context of the global COVID-19 pandemic, which necessitated consideration that the patient might be at risk for infection with the SARS-CoV-2 virus that causes COVID-19. Institutional protocols and algorithms that pertain to the evaluation of patients at risk for COVID-19 are in a state of rapid change based on information released by regulatory bodies including the CDC and federal and state organizations. These policies and algorithms were followed during the patient's care in the ED.   Patient presents with shortness of breath and cough, most likely COPD exacerbation versus pneumonia.  Will check labs and chest x-ray.  Check Covid test.  Doubt ACS PE dissection or pericarditis.  ----------------------------------------- 12:30 PM on 06/19/2020 -----------------------------------------  Chest x-ray shows right basilar pneumonia.  With his hypoxia requiring 2 L nasal cannula and underlying chronic lung disease and age, will plan to admit for additional management.     ____________________________________________   FINAL CLINICAL IMPRESSION(S) / ED DIAGNOSES    Final diagnoses:  Community acquired pneumonia of right lower lobe of lung  Acute respiratory failure with hypoxia ([place])  Aortic atherosclerosis ([place])  Chronic obstructive pulmonary disease, unspecified COPD type [facility])     ED Discharge Orders    None      Portions of this note were generated with dragon dictation software. Dictation errors may occur despite best attempts at proofreading.   Carrie Mew, MD 07/01/2020  1231

## 2020-06-17 NOTE — ED Notes (Signed)
Pt assisted with use of urinal and assisted back to bed.

## 2020-06-18 DIAGNOSIS — J90 Pleural effusion, not elsewhere classified: Secondary | ICD-10-CM | POA: Diagnosis not present

## 2020-06-18 DIAGNOSIS — Z6825 Body mass index (BMI) 25.0-25.9, adult: Secondary | ICD-10-CM | POA: Diagnosis not present

## 2020-06-18 DIAGNOSIS — Z888 Allergy status to other drugs, medicaments and biological substances status: Secondary | ICD-10-CM | POA: Diagnosis not present

## 2020-06-18 DIAGNOSIS — I700 Atherosclerosis of aorta: Secondary | ICD-10-CM | POA: Diagnosis present

## 2020-06-18 DIAGNOSIS — J449 Chronic obstructive pulmonary disease, unspecified: Secondary | ICD-10-CM | POA: Diagnosis not present

## 2020-06-18 DIAGNOSIS — I255 Ischemic cardiomyopathy: Secondary | ICD-10-CM | POA: Diagnosis present

## 2020-06-18 DIAGNOSIS — F419 Anxiety disorder, unspecified: Secondary | ICD-10-CM | POA: Diagnosis present

## 2020-06-18 DIAGNOSIS — G9341 Metabolic encephalopathy: Secondary | ICD-10-CM | POA: Diagnosis present

## 2020-06-18 DIAGNOSIS — I2510 Atherosclerotic heart disease of native coronary artery without angina pectoris: Secondary | ICD-10-CM | POA: Diagnosis present

## 2020-06-18 DIAGNOSIS — J189 Pneumonia, unspecified organism: Secondary | ICD-10-CM | POA: Diagnosis present

## 2020-06-18 DIAGNOSIS — G720 Drug-induced myopathy: Secondary | ICD-10-CM | POA: Diagnosis present

## 2020-06-18 DIAGNOSIS — F17210 Nicotine dependence, cigarettes, uncomplicated: Secondary | ICD-10-CM | POA: Diagnosis present

## 2020-06-18 DIAGNOSIS — T466X5A Adverse effect of antihyperlipidemic and antiarteriosclerotic drugs, initial encounter: Secondary | ICD-10-CM | POA: Diagnosis present

## 2020-06-18 DIAGNOSIS — T17500A Unspecified foreign body in bronchus causing asphyxiation, initial encounter: Secondary | ICD-10-CM | POA: Diagnosis not present

## 2020-06-18 DIAGNOSIS — I252 Old myocardial infarction: Secondary | ICD-10-CM | POA: Diagnosis not present

## 2020-06-18 DIAGNOSIS — J9601 Acute respiratory failure with hypoxia: Secondary | ICD-10-CM | POA: Diagnosis not present

## 2020-06-18 DIAGNOSIS — E440 Moderate protein-calorie malnutrition: Secondary | ICD-10-CM | POA: Diagnosis not present

## 2020-06-18 DIAGNOSIS — F32A Depression, unspecified: Secondary | ICD-10-CM | POA: Diagnosis present

## 2020-06-18 DIAGNOSIS — R627 Adult failure to thrive: Secondary | ICD-10-CM | POA: Diagnosis present

## 2020-06-18 DIAGNOSIS — J9811 Atelectasis: Secondary | ICD-10-CM | POA: Diagnosis not present

## 2020-06-18 DIAGNOSIS — J9621 Acute and chronic respiratory failure with hypoxia: Secondary | ICD-10-CM | POA: Diagnosis not present

## 2020-06-18 DIAGNOSIS — Z951 Presence of aortocoronary bypass graft: Secondary | ICD-10-CM | POA: Diagnosis not present

## 2020-06-18 DIAGNOSIS — Z955 Presence of coronary angioplasty implant and graft: Secondary | ICD-10-CM | POA: Diagnosis not present

## 2020-06-18 DIAGNOSIS — J440 Chronic obstructive pulmonary disease with acute lower respiratory infection: Secondary | ICD-10-CM | POA: Diagnosis present

## 2020-06-18 DIAGNOSIS — Z515 Encounter for palliative care: Secondary | ICD-10-CM | POA: Diagnosis not present

## 2020-06-18 DIAGNOSIS — J441 Chronic obstructive pulmonary disease with (acute) exacerbation: Secondary | ICD-10-CM | POA: Diagnosis not present

## 2020-06-18 DIAGNOSIS — Z66 Do not resuscitate: Secondary | ICD-10-CM | POA: Diagnosis not present

## 2020-06-18 DIAGNOSIS — R0602 Shortness of breath: Secondary | ICD-10-CM | POA: Diagnosis not present

## 2020-06-18 DIAGNOSIS — Z28310 Unvaccinated for covid-19: Secondary | ICD-10-CM | POA: Diagnosis not present

## 2020-06-18 DIAGNOSIS — I10 Essential (primary) hypertension: Secondary | ICD-10-CM | POA: Diagnosis present

## 2020-06-18 DIAGNOSIS — Z20822 Contact with and (suspected) exposure to covid-19: Secondary | ICD-10-CM | POA: Diagnosis present

## 2020-06-18 LAB — MRSA PCR SCREENING: MRSA by PCR: NEGATIVE

## 2020-06-18 LAB — BASIC METABOLIC PANEL
Anion gap: 10 (ref 5–15)
BUN: 15 mg/dL (ref 8–23)
CO2: 30 mmol/L (ref 22–32)
Calcium: 9.8 mg/dL (ref 8.9–10.3)
Chloride: 96 mmol/L — ABNORMAL LOW (ref 98–111)
Creatinine, Ser: 0.76 mg/dL (ref 0.61–1.24)
GFR, Estimated: >60 mL/min (ref >60)
Glucose, Bld: 101 mg/dL — ABNORMAL HIGH (ref 70–99)
Potassium: 4.2 mmol/L (ref 3.5–5.1)
Sodium: 136 mmol/L (ref 135–145)

## 2020-06-18 LAB — CBC
HCT: 53.4 % — ABNORMAL HIGH (ref 39.0–52.0)
Hemoglobin: 17.8 g/dL — ABNORMAL HIGH (ref 13.0–17.0)
MCH: 31.7 pg (ref 26.0–34.0)
MCHC: 33.3 g/dL (ref 30.0–36.0)
MCV: 95 fL (ref 80.0–100.0)
Platelets: 226 10*3/uL (ref 150–400)
RBC: 5.62 MIL/uL (ref 4.22–5.81)
RDW: 12.5 % (ref 11.5–15.5)
WBC: 11.9 10*3/uL — ABNORMAL HIGH (ref 4.0–10.5)
nRBC: 0 % (ref 0.0–0.2)

## 2020-06-18 LAB — LACTIC ACID, PLASMA
Lactic Acid, Venous: 1.9 mmol/L (ref 0.5–1.9)
Lactic Acid, Venous: 1.4 mmol/L (ref 0.5–1.9)

## 2020-06-18 LAB — BRAIN NATRIURETIC PEPTIDE: B Natriuretic Peptide: 64.2 pg/mL (ref 0.0–100.0)

## 2020-06-18 LAB — MAGNESIUM: Magnesium: 2.1 mg/dL (ref 1.7–2.4)

## 2020-06-18 LAB — TROPONIN I (HIGH SENSITIVITY)
Troponin I (High Sensitivity): 10 ng/L (ref <18)
Troponin I (High Sensitivity): 8 ng/L (ref <18)

## 2020-06-18 LAB — PROCALCITONIN: Procalcitonin: <0.1 ng/mL

## 2020-06-18 LAB — GLUCOSE, CAPILLARY: Glucose-Capillary: 105 mg/dL — ABNORMAL HIGH (ref 70–99)

## 2020-06-18 MED ORDER — PIPERACILLIN-TAZOBACTAM 3.375 G IVPB
3.3750 g | Freq: Three times a day (TID) | INTRAVENOUS | Status: DC
  Administered 2020-06-18: 3.375 g via INTRAVENOUS
  Filled 2020-06-18: qty 50

## 2020-06-18 MED ORDER — NEPRO/CARBSTEADY PO LIQD
237.0000 mL | Freq: Three times a day (TID) | ORAL | Status: DC
  Administered 2020-06-20 – 2020-06-21 (×3): 237 mL via ORAL

## 2020-06-18 MED ORDER — CHLORHEXIDINE GLUCONATE CLOTH 2 % EX PADS
6.0000 | MEDICATED_PAD | Freq: Every day | CUTANEOUS | Status: DC
  Administered 2020-06-18 – 2020-06-21 (×2): 6 via TOPICAL

## 2020-06-18 MED ORDER — ALPRAZOLAM 0.25 MG PO TABS
0.2500 mg | ORAL_TABLET | Freq: Once | ORAL | Status: AC
  Administered 2020-06-18: 0.25 mg via ORAL
  Filled 2020-06-18: qty 1

## 2020-06-18 MED ORDER — METHYLPREDNISOLONE SODIUM SUCC 40 MG IJ SOLR
40.0000 mg | Freq: Two times a day (BID) | INTRAMUSCULAR | Status: DC
  Administered 2020-06-18 – 2020-06-23 (×11): 40 mg via INTRAVENOUS
  Filled 2020-06-18 (×12): qty 1

## 2020-06-18 MED ORDER — SODIUM CHLORIDE 0.9 % IV SOLN
2.0000 g | INTRAVENOUS | Status: DC
  Administered 2020-06-18 – 2020-06-20 (×3): 2 g via INTRAVENOUS
  Filled 2020-06-18: qty 2
  Filled 2020-06-18: qty 20
  Filled 2020-06-18 (×2): qty 2

## 2020-06-18 MED ORDER — ADULT MULTIVITAMIN W/MINERALS CH
1.0000 | ORAL_TABLET | Freq: Every day | ORAL | Status: DC
  Administered 2020-06-19 – 2020-06-21 (×3): 1 via ORAL
  Filled 2020-06-18 (×4): qty 1

## 2020-06-18 MED ORDER — DEXMEDETOMIDINE HCL IN NACL 400 MCG/100ML IV SOLN
0.4000 ug/kg/h | INTRAVENOUS | Status: DC
  Administered 2020-06-18 (×2): 0.4 ug/kg/h via INTRAVENOUS
  Administered 2020-06-19 (×2): 0.8 ug/kg/h via INTRAVENOUS
  Administered 2020-06-20: 1.2 ug/kg/h via INTRAVENOUS
  Administered 2020-06-20: 1 ug/kg/h via INTRAVENOUS
  Administered 2020-06-20 – 2020-06-21 (×2): 0.4 ug/kg/h via INTRAVENOUS
  Administered 2020-06-21: 0.6 ug/kg/h via INTRAVENOUS
  Filled 2020-06-18 (×9): qty 100

## 2020-06-18 MED ORDER — BUDESONIDE 0.5 MG/2ML IN SUSP
0.5000 mg | Freq: Two times a day (BID) | RESPIRATORY_TRACT | Status: DC
  Administered 2020-06-18 – 2020-06-24 (×13): 0.5 mg via RESPIRATORY_TRACT
  Filled 2020-06-18 (×14): qty 2

## 2020-06-18 MED ORDER — VANCOMYCIN HCL 1000 MG/200ML IV SOLN
1000.0000 mg | Freq: Two times a day (BID) | INTRAVENOUS | Status: DC
  Administered 2020-06-18: 1000 mg via INTRAVENOUS
  Filled 2020-06-18 (×2): qty 200

## 2020-06-18 MED ORDER — IPRATROPIUM-ALBUTEROL 0.5-2.5 (3) MG/3ML IN SOLN
3.0000 mL | RESPIRATORY_TRACT | Status: DC
  Administered 2020-06-18 – 2020-06-19 (×5): 3 mL via RESPIRATORY_TRACT
  Filled 2020-06-18 (×5): qty 3

## 2020-06-18 MED ORDER — LORAZEPAM 2 MG/ML IJ SOLN: 1.0000 mg | Freq: Once | INTRAMUSCULAR | Status: DC

## 2020-06-18 MED ORDER — SODIUM CHLORIDE 0.9 % IV SOLN
500.0000 mg | INTRAVENOUS | Status: AC
  Administered 2020-06-18 – 2020-06-21 (×4): 500 mg via INTRAVENOUS
  Filled 2020-06-18 (×4): qty 500

## 2020-06-18 NOTE — Plan of Care (Signed)
  Problem: Clinical Measurements: Goal: Ability to maintain clinical measurements within normal limits will improve Outcome: Progressing Goal: Will remain free from infection Outcome: Progressing Goal: Respiratory complications will improve Outcome: Progressing   Problem: Activity: Goal: Risk for activity intolerance will decrease Outcome: Progressing   Problem: Nutrition: Goal: Adequate nutrition will be maintained Outcome: Progressing   Problem: Coping: Goal: Level of anxiety will decrease Outcome: Progressing   Problem: Elimination: Goal: Will not experience complications related to bowel motility Outcome: Progressing Goal: Will not experience complications related to urinary retention Outcome: Progressing   Problem: Pain Managment: Goal: General experience of comfort will improve Outcome: Progressing   Problem: Safety: Goal: Ability to remain free from injury will improve Outcome: Progressing   Problem: Skin Integrity: Goal: Risk for impaired skin integrity will decrease Outcome: Progressing

## 2020-06-18 NOTE — Progress Notes (Addendum)
Cross Cover Patient with increasing oxygen requirements and progressed with increased shortness of breath.  Bedside patient verbalizes breathing has gotten worse since admit.  BBS diminished throughout right and left lower lobe.  Short shallow elevated rate for resp pattern.  Complained of chest pain that started right of sternum then progressed to left 3/10 on scale. No worsening or alleviating factors identified. Patient unable to tolerate EKG procedure but no ST elevation on telmonitorvbg worsening with [pH now 7.33 CO2 64 and PO2 <31 Antibiotics changed to zosyn and vanc MRSA PCR swab pending -if negative discontinue vanc Patient to ICU for BIPAP ICU team consulted  - discussed with Marda Stalker NP Lactic acid 1.9, procalcitonin pending Troponin normal at 10 precedex for bipap tolerance Spouse noticied of chages over phone

## 2020-06-18 NOTE — Progress Notes (Signed)
GOALS OF CARE DISCUSSION  The Clinical status was relayed to family in detail. Wife at bedside  Updated and notified of patients medical condition.  Patient with increased WOB and using accessory muscles to breathe Explained to family course of therapy and the modalities     PATIENT REMAINS FULL CODE Severe resp failure, RLL pneumonia  Family understands the situation.  Family are satisfied with Plan of action and management. All questions answered  Additional CC time 35 mins   Devonna Oboyle Patricia Pesa, M.D.  [facility]  Medical Director [place] Director [facility] Cardio-Pulmonary Department

## 2020-06-18 NOTE — Consult Note (Signed)
NAME:  Benjamin Robertson, MRN:  [id number], DOB:  Nov 23, 1950, LOS: 0 ADMISSION DATE:  06/27/2020, CONSULTATION DATE: 06/18/2020 REFERRING MD: Sharion Settler, NP, CHIEF COMPLAINT: Shortness of Breath  History of Present Illness:  This is a 70 yo male who presented to [facility] ER on 04/7 with a 2 day hx of shortness of breath and chest congestion.  He also endorsed a nonproductive cough.  ED course  Upon arrival to the ER Respiratory Panel by RT-PCR negative, however CXR concerning for RLL pneumonia and probable small effusion.  CTA Chest negative for PE, however concerning for pneumonia.  He received ceftriaxone and azithromycin.  He was subsequently admitted to the [facility] unit for additional workup and treatment per hospitalist team.  However, on 04/8 pt required transfer to the stepdown unit for Bipap and PCCM team consulted to assist with management.    Pertinent  Medical History  Pneumonia  Obesity  Kidney Stones Ischemic Cardiomyopathy  HLD HTN Hemorrhoids  Childhood Asthma CAD s/p CABG x3 Arthritis   Significant Hospital Events: Including procedures, antibiotic start and stop dates in addition to other pertinent events   . Pt admitted to the medsurg unit with pneumonia 04/7 . Pt transferred to the stepdown unit 04/8 with worsening respiratory failure requiring Bipap   Interim History / Subjective:  Pt mildly tachypneic on Bipap stating he felt better off Bipap   Objective   Blood pressure 121/77, pulse 94, temperature 98.2 F (36.8 C), temperature source Axillary, resp. rate (!) 22, height 5\' 8"  (1.727 m), weight 76.6 kg, SpO2 93 %.    FiO2 (%):  [100 %] 100 %   Intake/Output Summary (Last 24 hours) at 06/18/2020 6301 Last data filed at 06/18/2020 0409 Gross per 24 hour  Intake --  Output 1100 ml  Net -1100 ml   Filed Weights   06/16/2020 1059 06/18/20 0525  Weight: 74.8 kg 76.6 kg    Examination: General: acutely ill appearing male, mild respiratory distress on Bipap  HENT:  mild JVD present  Lungs: rhonchi throughout, mildly tachypneic with slight use of abdominal muscles to breath  Cardiovascular: nsr, rrr, s1s2, no R/G, 2+ radial/2+ distal pulses, trace bilateral lower extremity edema  Abdomen: +BS x4, soft, non tender, non distended  Extremities: normal bulk and tone, moves all extremities  Neuro: lethargic but arousable to voice, oriented, following commands GU: no foley   Labs/imaging that I havepersonally reviewed  (right click and "Reselect all SmartList Selections" daily)  04/7: Respiratory Panel by RT-PCR negative, however CXR concerning for RLL pneumonia and probable small effusion 04/7: CTA Chest negative for PE, however concerning for pneumonia 04/7: Azithromycin 04/7>> 04/7: Ceftriaxone 04/7>>  Resolved Hospital Problem list   N/A  Assessment & Plan:   Acute on chronic hypoxic respiratory failure secondary to pneumonia and AECOPD Prn Bipap and/or supplemental O2 for dyspnea and/or hypoxia IV and nebulized steroids  Scheduled and prn bronchodilator therapy  Trend WBC and monitor fever curve  Trend PCT Follow cultures  Continue azithromycin and ceftriaxone  Aggressive pulmonary hygiene   Best practice (right click and "Reselect all SmartList Selections" daily)  Diet:  Oral Pain/Anxiety/Delirium protocol (if indicated): No VAP protocol (if indicated): Not indicated DVT prophylaxis: LMWH GI prophylaxis: N/A Glucose control:  SSI No Central venous access:  N/A Arterial line:  N/A Foley:  N/A Mobility:  bed rest  PT consulted: N/A Last date of multidisciplinary goals of care discussion [N/A] Code Status:  full code Disposition: [employer]  CBC: Recent Labs  Lab 06/18/2020 1157 06/18/20 0456  WBC 9.1 11.9*  NEUTROABS 7.0  --   HGB 17.5* 17.8*  HCT 50.8 53.4*  MCV 93.2 95.0  PLT 247 417    Basic Metabolic Panel: Recent Labs  Lab 07/10/2020 1157 06/18/20 0456  NA 135 136  K 4.4 4.2  CL 99 96*  CO2 26 30  GLUCOSE  105* 101*  BUN 15 15  CREATININE 0.81 0.76  CALCIUM 9.8 9.8  MG  --  2.1   GFR: Estimated Creatinine Clearance: 85.5 mL/min (by C-G formula based on SCr of 0.76 mg/dL). Recent Labs  Lab 06/13/2020 1157 06/18/20 0456 06/18/20 0533  WBC 9.1 11.9*  --   LATICACIDVEN  --   --  1.9    Liver Function Tests: No results for input(s): AST, ALT, ALKPHOS, BILITOT, PROT, ALBUMIN in the last 168 hours. No results for input(s): LIPASE, AMYLASE in the last 168 hours. No results for input(s): AMMONIA in the last 168 hours.  ABG    Component Value Date/Time   HCO3 33.7 (H) 06/18/2020 0500   O2SAT 25.2 06/18/2020 0500     Coagulation Profile: No results for input(s): INR, PROTIME in the last 168 hours.  Cardiac Enzymes: No results for input(s): CKTOTAL, CKMB, CKMBINDEX, TROPONINI in the last 168 hours.  HbA1C: Hgb A1c MFr Bld  Date/Time Value Ref Range Status  01/13/2019 09:49 AM 5.7 (H) 4.8 - 5.6 % Final    Comment:             Prediabetes: 5.7 - 6.4          Diabetes: >6.4          Glycemic control for adults with diabetes: <7.0   04/26/2017 09:24 AM 5.8 (H) 4.8 - 5.6 % Final    Comment:             Prediabetes: 5.7 - 6.4          Diabetes: >6.4          Glycemic control for adults with diabetes: <7.0     CBG: No results for input(s): GLUCAP in the last 168 hours.  Review of Systems: Positives in BOLD   Gen: Denies fever, chills, weight change, fatigue, night sweats HEENT: Denies blurred vision, double vision, hearing loss, tinnitus, sinus congestion, rhinorrhea, sore throat, neck stiffness, dysphagia PULM: shortness of breath, cough, sputum production, hemoptysis, wheezing CV: Denies chest pain, edema, orthopnea, paroxysmal nocturnal dyspnea, palpitations GI: Denies abdominal pain, nausea, vomiting, diarrhea, hematochezia, melena, constipation, change in bowel habits GU: Denies dysuria, hematuria, polyuria, oliguria, urethral discharge Endocrine: Denies hot or cold  intolerance, polyuria, polyphagia or appetite change Derm: Denies rash, dry skin, scaling or peeling skin change Heme: Denies easy bruising, bleeding, bleeding gums Neuro: Denies headache, numbness, weakness, slurred speech, loss of memory or consciousness  Past Medical History:  He,  has a past medical history of Arthritis, CAD in native artery (1994, 2001), Childhood asthma, Hemorrhoids, History of ST elevation myocardial infarction (STEMI) of inferior wall (1994; 1997), Hyperlipidemia LDL goal <70, Hypertension, Ischemic cardiomyopathy (1994, 2003), Kidney stones, Obesity (BMI 30.0-34.9), Pneumonia (2014), and S/P CABG x 3 (2001).   Surgical History:   Past Surgical History:  Procedure Laterality Date  . CARDIAC CATHETERIZATION  06/06/1999   2001 Exertional Angina --> Myoview: Inf infarct and Ant ischemia --> Cath: 95% pLAD, 99% D1, 80% RI & Cx-OM1&2 OK , & patent RCA stent --> CABG  . CARDIAC CATHETERIZATION  10/04/2001  Patent grafts with a 50% lesion in LAD beyond IMA insertion  . CARDIAC CATHETERIZATION N/A 07/31/2014   Procedure: Left Heart Cath and Cors/Grafts Angiography;  Surgeon: Jettie Booze, MD;  Location: [place] [facility];  Service: Cardiovascular;  Laterality: N/A;  . CARDIAC CATHETERIZATION N/A 07/31/2014   Procedure: Coronary Stent Intervention;  Surgeon: Jettie Booze, MD;  Location: [place] [facility];  Service: Cardiovascular: PCI to Ostial Cx (not bypassed) - Xience DES 3.0 x 15 (3.6 mm)  . CAROTID DOPPLER  05/06/2012   Less than 50% diameter reduction of the Lft Subclavian.  . CORONARY ANGIOGRAPHY N/A 11/18/2019   Procedure: CORONARY ANGIOGRAPHY;  Surgeon: Troy Sine, MD;  Location: [place] [facility];  Service: Cardiovascular; CONFIRMS p-mRCA stents (from 9/5); Stable Ostial Lcx 95% - for staged PCI; Ost LAD ~50% with diffuse diseaes (competetive flow from LIMA noted), RI 80%-100%, D1 100%.   . CORONARY ANGIOPLASTY WITH STENT PLACEMENT  01/21/1996    PTCA of RCA-95% lesion/per wife 2015  . CORONARY ARTERY BYPASS GRAFT  06/07/1999   x3. LIMA to distal LAD, SVG to first diag, and SVG to ramus  . CORONARY STENT INTERVENTION N/A 11/16/2019   Procedure: CORONARY STENT INTERVENTION;  Surgeon: Troy Sine, MD;  Location: [place] [facility];  Service: Cardiovascular;; mRCA 100% In-stent thrombosis --> Resolute Onyx DES 2.5 x 30 (overlapping prior stent & covering lesion distal to prior stent) -> post-dilated to 2.8 mm  . CORONARY STENT INTERVENTION N/A 11/18/2019   Procedure: CORONARY STENT INTERVENTION;  Surgeon: Troy Sine, MD;  Location: [place] [facility];  Service: Cardiovascular;; 95% ostial LCx (at proximal edge of prior stent) -> DES PCI from LM-ostLCx (overlapping prior stent in LCx) - Scoring Balloon PTCA followed by Resolute Onyx DES 3.5 x 15 mm - post-dilated 3.8 - 3.7 mm. (did not loose LAD flow)  . CORONARY/GRAFT ACUTE MI REVASCULARIZATION N/A 11/16/2019   Procedure: Coronary/Graft Acute MI Revascularization;  Surgeon: Troy Sine, MD;  Location: [place] [facility];  Service: Cardiovascular;  --> PTCA-DES PCI mRCA 100% ISR/thrombosis  . HYDROCELE EXCISION Left 02/28/2016   Procedure: HYDROCELECTOMY ADULT;  Surgeon: Kathie Rhodes, MD;  Location: [facility];  Service: Urology;  Laterality: Left;  . LEFT HEART CATH AND CORS/GRAFTS ANGIOGRAPHY N/A 11/16/2019   Procedure: LEFT HEART CATH AND CORS/GRAFTS ANGIOGRAPHY;  Surgeon: Troy Sine, MD;  Location: [place] [facility];  Service: Cardiovascular;; For ACS --> 100% p-mRCA In-stent Thrombosis & disease outside of stent; Ost LCx 95% at prox edge of prior stent, OM & LPL branches - OK.  Ost LAD ~50% diffuse Dz, competetive flow from LIMA: RI & D1 ~CTO; Patent LIMA-LAD, SVG-RI, SVG-D1  . LITHOTRIPSY  "2-3 times"   1988/2003/2007  . NM MYOVIEW LTD  08/19/2009   Moderafte perfusion seen in Basal inferoseptal, Basal inferior, Mid inferoseptal, Mid inferiorm, and Apical inferior regions. No ECG  changes. EKG negative for ischemia.;   . TRANSTHORACIC ECHOCARDIOGRAM  05/06/2012   EF 40-45%, LV cavity moderately dilated, systolic function mild-moderately reduced, moderate hypokinesis of anterolatewral myocardium.  . TRANSTHORACIC ECHOCARDIOGRAM  11/15/2019   (ACS- 100% RCA-PCI, 95%LCx - staged PCI): EF 55-60%, ~no RWMA, GR1 DD.  ~ Normal Valves.     Social History:   reports that he has been smoking cigarettes. He has a 12.00 pack-year smoking history. He has never used smokeless tobacco. He reports that he does not drink alcohol and does not use drugs.   Family  History:  His family history includes Heart attack in his mother; Heart disease in his mother; Lung cancer in his sister; Lymphoma in his sister; Stroke in his sister.   Allergies Allergies  Allergen Reactions  . Brilinta [Ticagrelor] Shortness Of Breath    Having breathing issues  . Advicor [Niacin-Lovastatin Er] Other (See Comments)    Headache, possibly other reactions  . Lipitor [Atorvastatin] Other (See Comments)    Headache, possibly other reactions  . Pravachol [Pravastatin Sodium] Other (See Comments)    Headache, possibly other reactions  . Sulfa Antibiotics Other (See Comments)    Headache, possibly other reactions  . Repatha [Evolocumab] Other (See Comments)    Malaise, breathing issues  . Rosuvastatin Other (See Comments)     40 MG  TABLET--EXTREME FATIGUE  , MUSCLE  PAIN      Home Medications  Prior to Admission medications   Medication Sig Start Date End Date Taking? Authorizing Provider  albuterol (VENTOLIN HFA) 108 (90 Base) MCG/ACT inhaler Inhale 2 puffs into the lungs every 6 (six) hours as needed for wheezing or shortness of breath. 10/06/19  Yes Venia Carbon, MD  clopidogrel (PLAVIX) 75 MG tablet Take 1 tablet (75 mg total) by mouth daily. 02/26/20  Yes Leonie Man, MD  DULoxetine (CYMBALTA) 60 MG capsule Take 1 capsule (60 mg total) by mouth daily. 05/07/20  Yes Venia Carbon, MD   fluticasone (FLONASE) 50 MCG/ACT nasal spray Place 2 sprays into both nostrils daily. 05/19/20  Yes [provider]  guaiFENesin (MUCINEX) 600 MG 12 hr tablet Take 300 mg by mouth 2 (two) times daily as needed.   Yes [provider]  Magnesium Oxide 250 MG TABS Take 1 tablet by mouth daily.    Yes [provider]  nitroGLYCERIN (NITROLINGUAL) 0.4 MG/SPRAY spray PLACE 1 SPRAY UNDER THE TONGUE EVERY 5 (FIVE) MINUTES X 3 DOSES AS NEEDED FOR CHEST PAIN. 05/31/20  Yes Leonie Man, MD  pantoprazole (PROTONIX) 40 MG tablet Take 1 tablet (40 mg total) by mouth daily. 05/07/20  Yes Venia Carbon, MD  predniSONE (DELTASONE) 5 MG tablet Take 5 mg by mouth daily. 02/18/20  Yes [provider]  TRELEGY ELLIPTA 100-62.5-25 MCG/INH AEPB Inhale 1 puff into the lungs daily. 02/18/20  Yes [provider]  valsartan (DIOVAN) 80 MG tablet Take 1 tablet (80 mg total) by mouth daily. 05/17/20  Yes Leonie Man, MD     Marda Stalker, [place] Pager [phone] (please enter 7 digits) PCCM Consult Pager [phone] (please enter 7 digits)

## 2020-06-18 NOTE — Progress Notes (Signed)
Initial Nutrition Assessment  DOCUMENTATION CODES:   Non-severe (moderate) malnutrition in context of chronic illness  INTERVENTION:   Nepro Shake po TID, each supplement provides 425 kcal and 19 grams protein  MVI po daily   Liberalize diet   Pt at high refeed risk; recommend monitor potassium, magnesium and phosphorus labs daily until stable  NUTRITION DIAGNOSIS:   Moderate Malnutrition related to chronic illness (COPD) as evidenced by mild fat depletion,moderate fat depletion,severe muscle depletion,percent weight loss.  GOAL:   Patient will meet greater than or equal to 90% of their needs  MONITOR:   PO intake,Supplement acceptance,Labs,Weight trends,Skin,I & O's  REASON FOR ASSESSMENT:   Malnutrition Screening Tool    ASSESSMENT:   70 y.o. male with medical history significant for COPD stage I/II, history of tobacco use, CAD status post CABG x3 after PCI x2 to the RCA (lima-lad, svg-diagonal svg-ri), grafts to the ramus intermedius, and LAD-diagonal as well as stents in the native RCA and left main circumflex, hypertension, depression/anxiety, presents to the emergency department for chief concerns of shortness of breath.   Met with pt and pt's wife in room today. Pt reports decreased appetite and oral intake pta but reports that he has still has been eating. Wife at bedside reports that patient has not been eating as much as usual but that he has still been eating fairly well. Per chart, pt is down 41lbs(20%) over the past year; this is significant weight loss. Wife and patient are aware of the weight loss. Wife reports that patient has been drinking 2 vanilla Boost per day at home and that he just started this last week. Pt does not like Ensure. RD will add vanilla Nepro to help pt meet his estimated needs. RD will also liberalize pt's diet. Wife reports that patient is unable to eat ice cream or thick supplements as they get stuck in his throat. Pt is at high refeed risk  and at high risk for developing severe malnutrition.   Medications reviewed and include: plavix, lovenox, Mg oxide, solu-medrol, protonix, azithromycin, ceftriaxone, precedex   Labs reviewed: K 4.2 wnl, Mg 2.1 wnl Wbc- 11.9(H)  NUTRITION - FOCUSED PHYSICAL EXAM:  Flowsheet Row Most Recent Value  Orbital Region Mild depletion  Upper Arm Region Mild depletion  Thoracic and Lumbar Region Moderate depletion  Buccal Region No depletion  Temple Region Severe depletion  Clavicle Bone Region Severe depletion  Clavicle and Acromion Bone Region Severe depletion  Scapular Bone Region Mild depletion  Dorsal Hand Mild depletion  Patellar Region Moderate depletion  Anterior Thigh Region Moderate depletion  Posterior Calf Region Moderate depletion  Edema (RD Assessment) None  Hair Reviewed  Eyes Reviewed  Mouth Reviewed  Skin Reviewed  Nails Reviewed     Diet Order:   Diet Order            Diet Heart Room service appropriate? Yes; Fluid consistency: Thin  Diet effective now                EDUCATION NEEDS:   Education needs have been addressed  Skin:  Skin Assessment: Reviewed RN Assessment  Last BM:  4/6  Height:   Ht Readings from Last 1 Encounters:  06/18/20 _0  (1.727 m)    Weight:   Wt Readings from Last 1 Encounters:  06/18/20 76.6 kg    Ideal Body Weight:  70 kg  BMI:  Body mass index is 25.68 kg/m.  Estimated Nutritional Needs:   Kcal:  2100-2400kcal/day  Protein:  110-120g/day  Fluid:  1.8-2.1L/day  Koleen Distance MS, RD, LDN Please refer to [facility] for RD and/or RD on-call/weekend/after hours pager

## 2020-06-18 NOTE — Progress Notes (Addendum)
Pharmacy Antibiotic Note  Benjamin Robertson is a 70 y.o. male admitted on 06/27/2020 with pneumonia.  Pharmacy has been consulted for Zosyn and Vancomycin dosing.  Plan: Zosyn 3.375g IV q8h (4 hour infusion). per indication and renal fxn.  Vancomycin Vancomycin 1000 mg IV Q 12 hrs.  Goal AUC 400-550. Expected AUC: 458.5 SCr used: 0.76  Pharmacy will continue to follow SCr and adjust abx dosing if warranted.   Height: 5\' 8"  (172.7 cm) Weight: 76.6 kg (168 lb 14 oz) IBW/kg (Calculated) : 68.4  Temp (24hrs), Avg:98 F (36.7 C), Min:97.6 F (36.4 C), Max:98.3 F (36.8 C)  Recent Labs  Lab 07/10/2020 1157 06/18/20 0456 06/18/20 0533  WBC 9.1 11.9*  --   CREATININE 0.81 0.76  --   LATICACIDVEN  --   --  1.9    Estimated Creatinine Clearance: 85.5 mL/min (by C-G formula based on SCr of 0.76 mg/dL).    Allergies  Allergen Reactions  . Brilinta [Ticagrelor] Shortness Of Breath    Having breathing issues  . Advicor [Niacin-Lovastatin Er] Other (See Comments)    Headache, possibly other reactions  . Lipitor [Atorvastatin] Other (See Comments)    Headache, possibly other reactions  . Pravachol [Pravastatin Sodium] Other (See Comments)    Headache, possibly other reactions  . Sulfa Antibiotics Other (See Comments)    Headache, possibly other reactions  . Repatha [Evolocumab] Other (See Comments)    Malaise, breathing issues  . Rosuvastatin Other (See Comments)     40 MG  TABLET--EXTREME FATIGUE  , MUSCLE  PAIN     Antimicrobials this admission: 04/07 Azithromycin >> x 1 04/07 Ceftriaxone >> x 1 04/08 Vancomycin >> 04/08 Zosyn >>  Microbiology results: No Labs Pending at this time.  Thank you for allowing pharmacy to be a part of this patient's care.  Renda Rolls, PharmD, MBA 06/18/2020 7:01 AM

## 2020-06-18 NOTE — Plan of Care (Signed)
  Problem: Education: Goal: Knowledge of General Education information will improve Description: Including pain rating scale, medication(s)/side effects and non-pharmacologic comfort measures Outcome: Progressing   Problem: Health Behavior/Discharge Planning: Goal: Ability to manage health-related needs will improve Outcome: Progressing   Problem: Clinical Measurements: Goal: Diagnostic test results will improve Outcome: Progressing   Problem: Nutrition: Goal: Adequate nutrition will be maintained Outcome: Progressing   Problem: Coping: Goal: Level of anxiety will decrease Outcome: Progressing   Problem: Pain Managment: Goal: General experience of comfort will improve Outcome: Progressing   Problem: Safety: Goal: Ability to remain free from injury will improve Outcome: Progressing

## 2020-06-19 ENCOUNTER — Inpatient Hospital Stay: Payer: Medicare Other

## 2020-06-19 DIAGNOSIS — J189 Pneumonia, unspecified organism: Secondary | ICD-10-CM | POA: Diagnosis not present

## 2020-06-19 DIAGNOSIS — J9601 Acute respiratory failure with hypoxia: Secondary | ICD-10-CM | POA: Diagnosis not present

## 2020-06-19 DIAGNOSIS — J441 Chronic obstructive pulmonary disease with (acute) exacerbation: Secondary | ICD-10-CM | POA: Diagnosis not present

## 2020-06-19 LAB — CBC
HCT: 46.1 % (ref 39.0–52.0)
Hemoglobin: 15.8 g/dL (ref 13.0–17.0)
MCH: 32.2 pg (ref 26.0–34.0)
MCHC: 34.3 g/dL (ref 30.0–36.0)
MCV: 93.9 fL (ref 80.0–100.0)
Platelets: 192 10*3/uL (ref 150–400)
RBC: 4.91 MIL/uL (ref 4.22–5.81)
RDW: 12.2 % (ref 11.5–15.5)
WBC: 9.9 10*3/uL (ref 4.0–10.5)
nRBC: 0 % (ref 0.0–0.2)

## 2020-06-19 LAB — MAGNESIUM: Magnesium: 2 mg/dL (ref 1.7–2.4)

## 2020-06-19 LAB — BASIC METABOLIC PANEL
Anion gap: 10 (ref 5–15)
BUN: 21 mg/dL (ref 8–23)
CO2: 26 mmol/L (ref 22–32)
Calcium: 9.3 mg/dL (ref 8.9–10.3)
Chloride: 99 mmol/L (ref 98–111)
Creatinine, Ser: 0.63 mg/dL (ref 0.61–1.24)
GFR, Estimated: >60 mL/min (ref >60)
Glucose, Bld: 128 mg/dL — ABNORMAL HIGH (ref 70–99)
Potassium: 4.7 mmol/L (ref 3.5–5.1)
Sodium: 135 mmol/L (ref 135–145)

## 2020-06-19 LAB — PHOSPHORUS: Phosphorus: 4.4 mg/dL (ref 2.5–4.6)

## 2020-06-19 MED ORDER — GUAIFENESIN ER 600 MG PO TB12
1200.0000 mg | ORAL_TABLET | Freq: Two times a day (BID) | ORAL | Status: DC
  Administered 2020-06-19 – 2020-06-21 (×5): 1200 mg via ORAL
  Filled 2020-06-19 (×6): qty 2

## 2020-06-19 MED ORDER — ALBUTEROL SULFATE (2.5 MG/3ML) 0.083% IN NEBU
2.5000 mg | INHALATION_SOLUTION | RESPIRATORY_TRACT | Status: DC
  Administered 2020-06-19 – 2020-06-21 (×12): 2.5 mg via RESPIRATORY_TRACT
  Filled 2020-06-19 (×12): qty 3

## 2020-06-19 NOTE — Plan of Care (Signed)
°  Problem: Education: °Goal: Knowledge of General Education information will improve °Description: Including pain rating scale, medication(s)/side effects and non-pharmacologic comfort measures °Outcome: Progressing °  °Problem: Health Behavior/Discharge Planning: °Goal: Ability to manage health-related needs will improve °Outcome: Progressing °  °Problem: Clinical Measurements: °Goal: Cardiovascular complication will be avoided °Outcome: Progressing °  °

## 2020-06-19 NOTE — Progress Notes (Signed)
NAME:  Benjamin Robertson, MRN:  [id number], DOB:  Apr 30, 1950, LOS: 1 ADMISSION DATE:  07/10/2020, CONSULTATION DATE: 06/18/2020 REFERRING MD: Sharion Settler, NP, CHIEF COMPLAINT: Shortness of Breath  History of Present Illness:  This is a 70 yo male who presented to [facility] ER on 04/7 with a 2 day hx of shortness of breath and chest congestion.  He also endorsed a nonproductive cough.  ED course  Upon arrival to the ER Respiratory Panel by RT-PCR negative, however CXR concerning for RLL pneumonia and probable small effusion.  CTA Chest negative for PE, however concerning for pneumonia.  He received ceftriaxone and azithromycin.  He was subsequently admitted to the [facility] unit for additional workup and treatment per hospitalist team.  However, on 04/08 pt required transfer to the stepdown unit for Bipap and PCCM team consulted to assist with management.    Pertinent  Medical History  Pneumonia  Obesity  Kidney Stones Ischemic Cardiomyopathy  HLD HTN Hemorrhoids  Childhood Asthma CAD s/p CABG x3 Arthritis   Significant Hospital Events: Including procedures, antibiotic start and stop dates in addition to other pertinent events   . Pt admitted to the medsurg unit with pneumonia 04/7 . Pt transferred to the stepdown unit 04/8 with worsening respiratory failure requiring Bipap  . 06/19/2020 very tenacious secretions, persistent collapse right lower lobe initiated MetaNeb  Interim History / Subjective:  Notes dyspnea and difficulty clearing secretions.  Voices no other complaints.  Objective   Blood pressure (!) 107/57, pulse 72, temperature 98 F (36.7 C), temperature source Axillary, resp. rate 20, height 5\' 8"  (1.727 m), weight 76.6 kg, SpO2 97 %.    FiO2 (%):  [70 %-85 %] 80 %   Intake/Output Summary (Last 24 hours) at 06/19/2020 1525 Last data filed at 06/19/2020 1500 Gross per 24 hour  Intake 719.27 ml  Output 525 ml  Net 194.27 ml   Filed Weights   06/20/2020 1059 06/18/20 0525   Weight: 74.8 kg 76.6 kg    Examination: General: acutely ill appearing male, mild tachypnea on high flow O2 HENT: No JVD Lungs: rhonchi throughout, mildly tachypneic with mild use of accessories of breathing Cardiovascular: nsr, rrr, s1s2, no R/G, 2+ radial/2+ distal pulses, trace bilateral lower extremity edema  Abdomen: +BS x4, soft, non tender, non distended  Extremities: normal bulk and tone, moves all extremities  Neuro: Anxious, no focal deficit, mildly confused but easily redirected GU: no foley   Chest x-ray performed today shows persistent dense consolidation right lower lobe:    Labs/imaging that I havepersonally reviewed  (right click and "Reselect all SmartList Selections" daily)  04/7: Respiratory Panel by RT-PCR negative, however CXR concerning for RLL pneumonia and probable small effusion 04/7: CTA Chest negative for PE, however concerning for pneumonia 04/7: Azithromycin 04/7>> 04/7: Ceftriaxone 04/7>>  Resolved Hospital Problem list   N/A  Assessment & Plan:   Acute on chronic hypoxic respiratory failure secondary to pneumonia and AECOPD High flow O2, patient not tolerating BiPAP If respiratory status worsens will need intubation IV and nebulized steroids  Scheduled and prn bronchodilator therapy  Discontinue SAMA as this will be drying to secretions MetaNeb with bronchodilators Trend WBC and monitor fever curve  Trend PCT Follow cultures  Check urine streptococcal and Legionella antigens Continue azithromycin and ceftriaxone  Aggressive pulmonary hygiene as above, add bed chest chest percussion  Anxiety, mild encephalopathy Due to acute illness and increased work of breathing Controlled with Precedex  Best practice (right click and "Reselect all  SmartList Selections" daily)  Diet:  Oral Pain/Anxiety/Delirium protocol (if indicated): No VAP protocol (if indicated): Not indicated DVT prophylaxis: LMWH GI prophylaxis: N/A Glucose control:  SSI  No Central venous access:  N/A Arterial line:  N/A Foley:  N/A Mobility:  bed rest  PT consulted: N/A Last date of multidisciplinary goals of care discussion [N/A] Patient's family and patient were updated at bedside by MD 06/19/2020 Code Status:  full code Disposition: Stepdown   Labs   CBC: Recent Labs  Lab 06/21/2020 1157 06/18/20 0456 06/19/20 0419  WBC 9.1 11.9* 9.9  NEUTROABS 7.0  --   --   HGB 17.5* 17.8* 15.8  HCT 50.8 53.4* 46.1  MCV 93.2 95.0 93.9  PLT 247 226 381    Basic Metabolic Panel: Recent Labs  Lab 06/11/2020 1157 06/18/20 0456 06/19/20 0419  NA 135 136 135  K 4.4 4.2 4.7  CL 99 96* 99  CO2 26 30 26   GLUCOSE 105* 101* 128*  BUN 15 15 21   CREATININE 0.81 0.76 0.63  CALCIUM 9.8 9.8 9.3  MG  --  2.1 2.0  PHOS  --   --  4.4   GFR: Estimated Creatinine Clearance: 85.5 mL/min (by C-G formula based on SCr of 0.63 mg/dL). Recent Labs  Lab 06/25/2020 1157 06/18/20 0456 06/18/20 0533 06/18/20 0700 06/19/20 0419  PROCALCITON  --  <0.10  --   --   --   WBC 9.1 11.9*  --   --  9.9  LATICACIDVEN  --   --  1.9 1.4  --     Liver Function Tests: No results for input(s): AST, ALT, ALKPHOS, BILITOT, PROT, ALBUMIN in the last 168 hours. No results for input(s): LIPASE, AMYLASE in the last 168 hours. No results for input(s): AMMONIA in the last 168 hours.  ABG    Component Value Date/Time   HCO3 33.7 (H) 06/18/2020 0500   O2SAT 25.2 06/18/2020 0500     Coagulation Profile: No results for input(s): INR, PROTIME in the last 168 hours.  Cardiac Enzymes: No results for input(s): CKTOTAL, CKMB, CKMBINDEX, TROPONINI in the last 168 hours.  HbA1C: Hgb A1c MFr Bld  Date/Time Value Ref Range Status  01/13/2019 09:49 AM 5.7 (H) 4.8 - 5.6 % Final    Comment:             Prediabetes: 5.7 - 6.4          Diabetes: >6.4          Glycemic control for adults with diabetes: <7.0   04/26/2017 09:24 AM 5.8 (H) 4.8 - 5.6 % Final    Comment:              Prediabetes: 5.7 - 6.4          Diabetes: >6.4          Glycemic control for adults with diabetes: <7.0     CBG: Recent Labs  Lab 06/18/20 0622  GLUCAP 105*    Review of Systems:   A 10 point review of systems was performed and it is as noted above otherwise negative.   Allergies Allergies  Allergen Reactions  . Brilinta [Ticagrelor] Shortness Of Breath    Having breathing issues  . Advicor [Niacin-Lovastatin Er] Other (See Comments)    Headache, possibly other reactions  . Lipitor [Atorvastatin] Other (See Comments)    Headache, possibly other reactions  . Pravachol [Pravastatin Sodium] Other (See Comments)    Headache, possibly other reactions  . Sulfa  Antibiotics Other (See Comments)    Headache, possibly other reactions  . Repatha [Evolocumab] Other (See Comments)    Malaise, breathing issues  . Rosuvastatin Other (See Comments)     40 MG  TABLET--EXTREME FATIGUE  , MUSCLE  PAIN      Scheduled Meds: . albuterol  2.5 mg Nebulization Q4H  . budesonide (PULMICORT) nebulizer solution  0.5 mg Nebulization BID  . Chlorhexidine Gluconate Cloth  6 each Topical Daily  . clopidogrel  75 mg Oral Daily  . DULoxetine  60 mg Oral Daily  . enoxaparin (LOVENOX) injection  40 mg Subcutaneous Q24H  . feeding supplement (NEPRO CARB STEADY)  237 mL Oral TID BM  . guaiFENesin  1,200 mg Oral BID  . irbesartan  75 mg Oral Daily  . magnesium oxide  200 mg Oral Daily  . methylPREDNISolone (SOLU-MEDROL) injection  40 mg Intravenous Q12H  . multivitamin with minerals  1 tablet Oral Daily  . pantoprazole  40 mg Oral Daily   Continuous Infusions: . azithromycin (ZITHROMAX) 500 MG IVPB (Vial-Mate [employer]) 500 mg (06/19/20 1142)  . cefTRIAXone (ROCEPHIN)  IV 2 g (06/18/20 2002)  . dexmedetomidine (PRECEDEX) IV infusion 0.8 mcg/kg/hr (06/19/20 1141)   PRN Meds:.acetaminophen **OR** acetaminophen, nitroGLYCERIN, ondansetron **OR** ondansetron (ZOFRAN) IV   Level 3 follow-up.  Renold Don, MD [place] [facility]   *This note was dictated using voice recognition software/Dragon.  Despite best efforts to proofread, errors can occur which can change the meaning.  Any change was purely unintentional.

## 2020-06-20 DIAGNOSIS — J189 Pneumonia, unspecified organism: Secondary | ICD-10-CM | POA: Diagnosis not present

## 2020-06-20 LAB — BASIC METABOLIC PANEL
Anion gap: 6 (ref 5–15)
BUN: 22 mg/dL (ref 8–23)
CO2: 28 mmol/L (ref 22–32)
Calcium: 9.1 mg/dL (ref 8.9–10.3)
Chloride: 99 mmol/L (ref 98–111)
Creatinine, Ser: 0.65 mg/dL (ref 0.61–1.24)
GFR, Estimated: >60 mL/min (ref >60)
Glucose, Bld: 127 mg/dL — ABNORMAL HIGH (ref 70–99)
Potassium: 4.6 mmol/L (ref 3.5–5.1)
Sodium: 133 mmol/L — ABNORMAL LOW (ref 135–145)

## 2020-06-20 LAB — CBC
HCT: 45.3 % (ref 39.0–52.0)
Hemoglobin: 15.8 g/dL (ref 13.0–17.0)
MCH: 32.6 pg (ref 26.0–34.0)
MCHC: 34.9 g/dL (ref 30.0–36.0)
MCV: 93.4 fL (ref 80.0–100.0)
Platelets: 205 10*3/uL (ref 150–400)
RBC: 4.85 MIL/uL (ref 4.22–5.81)
RDW: 12 % (ref 11.5–15.5)
WBC: 8.1 10*3/uL (ref 4.0–10.5)
nRBC: 0 % (ref 0.0–0.2)

## 2020-06-20 LAB — PHOSPHORUS: Phosphorus: 4.2 mg/dL (ref 2.5–4.6)

## 2020-06-20 LAB — MAGNESIUM: Magnesium: 2.1 mg/dL (ref 1.7–2.4)

## 2020-06-20 MED ORDER — ALBUTEROL SULFATE (2.5 MG/3ML) 0.083% IN NEBU
2.5000 mg | INHALATION_SOLUTION | Freq: Once | RESPIRATORY_TRACT | Status: AC
  Administered 2020-06-20: 2.5 mg via RESPIRATORY_TRACT
  Filled 2020-06-20: qty 3

## 2020-06-20 MED ORDER — TRAZODONE HCL 50 MG PO TABS
50.0000 mg | ORAL_TABLET | Freq: Every day | ORAL | Status: DC
  Administered 2020-06-20 – 2020-06-21 (×2): 50 mg via ORAL
  Filled 2020-06-20 (×3): qty 1

## 2020-06-20 NOTE — Progress Notes (Signed)
NAME:  Benjamin Robertson, MRN:  [id number], DOB:  1950-09-14, LOS: 2 ADMISSION DATE:  06/29/2020, CONSULTATION DATE: 06/18/2020 REFERRING MD: Sharion Settler, NP, CHIEF COMPLAINT: Shortness of Breath  History of Present Illness:  This is a 70 yo male who presented to [facility] ER on 04/7 with a 2 day hx of shortness of breath and chest congestion.  He also endorsed a nonproductive cough.  ED course  Upon arrival to the ER Respiratory Panel by RT-PCR negative, however CXR concerning for RLL pneumonia and probable small effusion.  CTA Chest negative for PE, however concerning for pneumonia.  He received ceftriaxone and azithromycin.  He was subsequently admitted to the [facility] unit for additional workup and treatment per hospitalist team.  However, on 04/08 pt required transfer to the stepdown unit for Bipap and PCCM team consulted to assist with management.    Pertinent  Medical History  Pneumonia  Obesity  Kidney Stones Ischemic Cardiomyopathy  HLD HTN Hemorrhoids  Childhood Asthma CAD s/p CABG x3 Arthritis   Significant Hospital Events: Including procedures, antibiotic start and stop dates in addition to other pertinent events   . Pt admitted to the medsurg unit with pneumonia 04/7 . Pt transferred to the stepdown unit 04/8 with worsening respiratory failure requiring Bipap  . 06/19/2020 very tenacious secretions, persistent collapse right lower lobe initiated MetaNeb  Interim History / Subjective:  Notes dyspnea and difficulty clearing secretions.  Voices no other complaints.  Objective   Blood pressure (!) 139/93, pulse 62, temperature 97.7 F (36.5 C), temperature source Oral, resp. rate 15, height 5\' 8"  (1.727 m), weight 76.6 kg, SpO2 97 %.    FiO2 (%):  [60 %] 60 %   Intake/Output Summary (Last 24 hours) at 06/20/2020 2243 Last data filed at 06/20/2020 2100 Gross per 24 hour  Intake 798.11 ml  Output 2925 ml  Net -2126.89 ml   Filed Weights   06/27/2020 1059 06/18/20 0525  Weight:  74.8 kg 76.6 kg    Examination: General: acutely ill appearing male, mild tachypnea on high flow O2  HENT: Ectropion on right, no JVD, trachea midline, no crepitus Lungs: rhonchi throughout, mildly tachypneic, able to expectorate more Cardiovascular: nsr, rrr, s1s2, no R/G, 2+ radial/2+ distal pulses, trace bilateral lower extremity edema  Abdomen: +BS x4, soft, non tender, non distended  Extremities: No edema, no cyanosis or clubbing Neuro: Anxious, no focal deficit, mildly confused but easily redirected GU: no foley   Chest x-ray performed today, 4/10 shows persistent dense consolidation right lower lobe:    Labs/imaging that I havepersonally reviewed  (right click and "Reselect all SmartList Selections" daily)  04/7: Respiratory Panel by RT-PCR negative, however CXR concerning for RLL pneumonia and probable small effusion 04/7: CTA Chest negative for PE, however concerning for pneumonia 04/7: Azithromycin 04/7>> 04/7: Ceftriaxone 04/7>>  Resolved Hospital Problem list   N/A  Assessment & Plan:   Acute on chronic hypoxic respiratory failure secondary to pneumonia and AECOPD High flow O2, patient not tolerating BiPAP O2 requirements decreasing If respiratory status worsens will need intubation IV and nebulized steroids  Scheduled and prn bronchodilator therapy  Discontinued SAMA due to tenacious secretions  MetaNeb with bronchodilators Trend WBC and monitor fever curve  Trend PCT Follow cultures  Check urine streptococcal and Legionella antigens Continue azithromycin and ceftriaxone  Aggressive pulmonary hygiene as above, bed chest chest percussion  Anxiety, mild encephalopathy Due to acute illness and increased work of breathing Controlled with Precedex  Best practice (right click  and "Reselect all SmartList Selections" daily)  Diet:  Oral Pain/Anxiety/Delirium protocol (if indicated): No VAP protocol (if indicated): Not indicated DVT prophylaxis: LMWH GI  prophylaxis: N/A Glucose control:  SSI No Central venous access:  N/A Arterial line:  N/A Foley:  N/A Mobility:  bed rest  PT consulted: N/A Last date of multidisciplinary goals of care discussion [N/A] Patient's family and patient were updated at bedside by MD 06/19/2020 Code Status:  full code Disposition: Stepdown   Labs   CBC: Recent Labs  Lab 06/15/2020 1157 06/18/20 0456 06/19/20 0419 06/20/20 0527  WBC 9.1 11.9* 9.9 8.1  NEUTROABS 7.0  --   --   --   HGB 17.5* 17.8* 15.8 15.8  HCT 50.8 53.4* 46.1 45.3  MCV 93.2 95.0 93.9 93.4  PLT 247 226 192 812    Basic Metabolic Panel: Recent Labs  Lab 06/25/2020 1157 06/18/20 0456 06/19/20 0419 06/20/20 0527  NA 135 136 135 133*  K 4.4 4.2 4.7 4.6  CL 99 96* 99 99  CO2 26 30 26 28   GLUCOSE 105* 101* 128* 127*  BUN 15 15 21 22   CREATININE 0.81 0.76 0.63 0.65  CALCIUM 9.8 9.8 9.3 9.1  MG  --  2.1 2.0 2.1  PHOS  --   --  4.4 4.2   GFR: Estimated Creatinine Clearance: 85.5 mL/min (by C-G formula based on SCr of 0.65 mg/dL). Recent Labs  Lab 07/06/2020 1157 06/18/20 0456 06/18/20 0533 06/18/20 0700 06/19/20 0419 06/20/20 0527  PROCALCITON  --  <0.10  --   --   --   --   WBC 9.1 11.9*  --   --  9.9 8.1  LATICACIDVEN  --   --  1.9 1.4  --   --     Liver Function Tests: No results for input(s): AST, ALT, ALKPHOS, BILITOT, PROT, ALBUMIN in the last 168 hours. No results for input(s): LIPASE, AMYLASE in the last 168 hours. No results for input(s): AMMONIA in the last 168 hours.  ABG    Component Value Date/Time   HCO3 33.7 (H) 06/18/2020 0500   O2SAT 25.2 06/18/2020 0500     Coagulation Profile: No results for input(s): INR, PROTIME in the last 168 hours.  Cardiac Enzymes: No results for input(s): CKTOTAL, CKMB, CKMBINDEX, TROPONINI in the last 168 hours.  HbA1C: Hgb A1c MFr Bld  Date/Time Value Ref Range Status  01/13/2019 09:49 AM 5.7 (H) 4.8 - 5.6 % Final    Comment:             Prediabetes: 5.7 -  6.4          Diabetes: >6.4          Glycemic control for adults with diabetes: <7.0   04/26/2017 09:24 AM 5.8 (H) 4.8 - 5.6 % Final    Comment:             Prediabetes: 5.7 - 6.4          Diabetes: >6.4          Glycemic control for adults with diabetes: <7.0     CBG: Recent Labs  Lab 06/18/20 0622  GLUCAP 105*    Review of Systems:   A 10 point review of systems was performed and it is as noted above otherwise negative.   Allergies Allergies  Allergen Reactions  . Brilinta [Ticagrelor] Shortness Of Breath    Having breathing issues  . Advicor [Niacin-Lovastatin Er] Other (See Comments)    Headache, possibly  other reactions  . Lipitor [Atorvastatin] Other (See Comments)    Headache, possibly other reactions  . Pravachol [Pravastatin Sodium] Other (See Comments)    Headache, possibly other reactions  . Sulfa Antibiotics Other (See Comments)    Headache, possibly other reactions  . Repatha [Evolocumab] Other (See Comments)    Malaise, breathing issues  . Rosuvastatin Other (See Comments)     40 MG  TABLET--EXTREME FATIGUE  , MUSCLE  PAIN      Scheduled Meds: . albuterol  2.5 mg Nebulization Q4H  . budesonide (PULMICORT) nebulizer solution  0.5 mg Nebulization BID  . Chlorhexidine Gluconate Cloth  6 each Topical Daily  . clopidogrel  75 mg Oral Daily  . DULoxetine  60 mg Oral Daily  . enoxaparin (LOVENOX) injection  40 mg Subcutaneous Q24H  . feeding supplement (NEPRO CARB STEADY)  237 mL Oral TID BM  . guaiFENesin  1,200 mg Oral BID  . irbesartan  75 mg Oral Daily  . magnesium oxide  200 mg Oral Daily  . methylPREDNISolone (SOLU-MEDROL) injection  40 mg Intravenous Q12H  . multivitamin with minerals  1 tablet Oral Daily  . pantoprazole  40 mg Oral Daily  . traZODone  50 mg Oral QHS   Continuous Infusions: . azithromycin (ZITHROMAX) 500 MG IVPB (Vial-Mate [employer]) Stopped (06/20/20 1355)  . cefTRIAXone (ROCEPHIN)  IV Stopped (06/20/20 2031)  .  dexmedetomidine (PRECEDEX) IV infusion 1.2 mcg/kg/hr (06/20/20 2100)   PRN Meds:.nitroGLYCERIN, ondansetron **OR** ondansetron (ZOFRAN) IV   Level 3 follow-up.  Renold Don, MD [place] [facility]   *This note was dictated using voice recognition software/Dragon.  Despite best efforts to proofread, errors can occur which can change the meaning.  Any change was purely unintentional.

## 2020-06-21 ENCOUNTER — Inpatient Hospital Stay: Payer: Medicare Other

## 2020-06-21 DIAGNOSIS — J9601 Acute respiratory failure with hypoxia: Secondary | ICD-10-CM | POA: Diagnosis not present

## 2020-06-21 DIAGNOSIS — J189 Pneumonia, unspecified organism: Secondary | ICD-10-CM | POA: Diagnosis not present

## 2020-06-21 LAB — BASIC METABOLIC PANEL
Anion gap: 8 (ref 5–15)
BUN: 22 mg/dL (ref 8–23)
CO2: 30 mmol/L (ref 22–32)
Calcium: 9.2 mg/dL (ref 8.9–10.3)
Chloride: 97 mmol/L — ABNORMAL LOW (ref 98–111)
Creatinine, Ser: 0.77 mg/dL (ref 0.61–1.24)
GFR, Estimated: >60 mL/min (ref >60)
Glucose, Bld: 116 mg/dL — ABNORMAL HIGH (ref 70–99)
Potassium: 4.4 mmol/L (ref 3.5–5.1)
Sodium: 135 mmol/L (ref 135–145)

## 2020-06-21 LAB — CBC
HCT: 48 % (ref 39.0–52.0)
Hemoglobin: 16.5 g/dL (ref 13.0–17.0)
MCH: 32 pg (ref 26.0–34.0)
MCHC: 34.4 g/dL (ref 30.0–36.0)
MCV: 93.2 fL (ref 80.0–100.0)
Platelets: 220 10*3/uL (ref 150–400)
RBC: 5.15 MIL/uL (ref 4.22–5.81)
RDW: 12 % (ref 11.5–15.5)
WBC: 6.2 10*3/uL (ref 4.0–10.5)
nRBC: 0 % (ref 0.0–0.2)

## 2020-06-21 LAB — MAGNESIUM: Magnesium: 2.2 mg/dL (ref 1.7–2.4)

## 2020-06-21 LAB — PHOSPHORUS: Phosphorus: 4.4 mg/dL (ref 2.5–4.6)

## 2020-06-21 MED ORDER — ALUM & MAG HYDROXIDE-SIMETH 200-200-20 MG/5ML PO SUSP
30.0000 mL | ORAL | Status: DC | PRN
  Administered 2020-06-22: 30 mL via ORAL
  Filled 2020-06-21: qty 30

## 2020-06-21 MED ORDER — SENNOSIDES-DOCUSATE SODIUM 8.6-50 MG PO TABS
2.0000 | ORAL_TABLET | Freq: Every day | ORAL | Status: DC
  Filled 2020-06-21: qty 2

## 2020-06-21 MED ORDER — CLONIDINE HCL 0.1 MG PO TABS
0.1000 mg | ORAL_TABLET | Freq: Two times a day (BID) | ORAL | Status: DC
  Administered 2020-06-21 – 2020-06-23 (×3): 0.1 mg via ORAL
  Filled 2020-06-21 (×4): qty 1

## 2020-06-21 MED ORDER — ARFORMOTEROL TARTRATE 15 MCG/2ML IN NEBU
15.0000 ug | INHALATION_SOLUTION | Freq: Two times a day (BID) | RESPIRATORY_TRACT | Status: DC
  Administered 2020-06-21 – 2020-06-23 (×3): 15 ug via RESPIRATORY_TRACT
  Filled 2020-06-21 (×9): qty 2

## 2020-06-21 MED ORDER — SODIUM CHLORIDE 3 % IN NEBU
4.0000 mL | INHALATION_SOLUTION | Freq: Two times a day (BID) | RESPIRATORY_TRACT | Status: DC
  Administered 2020-06-21 (×2): 4 mL via RESPIRATORY_TRACT
  Filled 2020-06-21 (×4): qty 4

## 2020-06-21 MED ORDER — ALBUTEROL SULFATE (2.5 MG/3ML) 0.083% IN NEBU
2.5000 mg | INHALATION_SOLUTION | RESPIRATORY_TRACT | Status: DC | PRN
  Administered 2020-06-23 (×2): 2.5 mg via RESPIRATORY_TRACT
  Filled 2020-06-21 (×2): qty 3

## 2020-06-21 MED ORDER — REVEFENACIN 175 MCG/3ML IN SOLN
175.0000 ug | Freq: Every day | RESPIRATORY_TRACT | Status: DC
  Administered 2020-06-21 – 2020-06-23 (×3): 175 ug via RESPIRATORY_TRACT
  Filled 2020-06-21 (×4): qty 3

## 2020-06-21 MED ORDER — POLYETHYLENE GLYCOL 3350 17 G PO PACK
17.0000 g | PACK | Freq: Every day | ORAL | Status: DC
  Administered 2020-06-21 – 2020-06-23 (×2): 17 g via ORAL
  Filled 2020-06-21 (×2): qty 1

## 2020-06-21 MED ORDER — SODIUM CHLORIDE 0.9 % IV SOLN
2.0000 g | INTRAVENOUS | Status: DC
  Administered 2020-06-21 – 2020-06-22 (×2): 2 g via INTRAVENOUS
  Filled 2020-06-21 (×3): qty 20

## 2020-06-21 MED ORDER — CLONAZEPAM 1 MG PO TABS
1.0000 mg | ORAL_TABLET | Freq: Two times a day (BID) | ORAL | Status: DC
  Administered 2020-06-21 – 2020-06-23 (×3): 1 mg via ORAL
  Filled 2020-06-21 (×4): qty 1

## 2020-06-21 NOTE — Progress Notes (Signed)
No change in head to toe assessment. Patient resting quietly. HR briefly dipped to 49 bpm, pt in deep sleep and asymptomatic. Precedex weaned down. Will continue to monitor.

## 2020-06-21 NOTE — Plan of Care (Signed)
  Problem: Education: Goal: Knowledge of General Education information will improve Description: Including pain rating scale, medication(s)/side effects and non-pharmacologic comfort measures 06/21/2020 0508 by Patrecia Pace, RN Outcome: Progressing 06/20/2020 2111 by Patrecia Pace, RN Outcome: Not Progressing   Problem: Health Behavior/Discharge Planning: Goal: Ability to manage health-related needs will improve 06/21/2020 0508 by Patrecia Pace, RN Outcome: Progressing 06/20/2020 2111 by Patrecia Pace, RN Outcome: Not Progressing   Problem: Clinical Measurements: Goal: Ability to maintain clinical measurements within normal limits will improve 06/21/2020 0508 by Patrecia Pace, RN Outcome: Progressing 06/20/2020 2111 by Patrecia Pace, RN Outcome: Not Progressing Goal: Will remain free from infection 06/21/2020 0508 by Patrecia Pace, RN Outcome: Progressing 06/20/2020 2111 by Patrecia Pace, RN Outcome: Not Progressing Goal: Diagnostic test results will improve 06/21/2020 0508 by Patrecia Pace, RN Outcome: Progressing 06/20/2020 2111 by Patrecia Pace, RN Outcome: Not Progressing Goal: Respiratory complications will improve 06/21/2020 0508 by Patrecia Pace, RN Outcome: Progressing 06/20/2020 2111 by Patrecia Pace, RN Outcome: Not Progressing Goal: Cardiovascular complication will be avoided 06/21/2020 0508 by Patrecia Pace, RN Outcome: Progressing 06/20/2020 2111 by Patrecia Pace, RN Outcome: Not Progressing   Problem: Activity: Goal: Risk for activity intolerance will decrease 06/21/2020 0508 by Patrecia Pace, RN Outcome: Progressing 06/20/2020 2111 by Patrecia Pace, RN Outcome: Not Progressing   Problem: Nutrition: Goal: Adequate nutrition will be maintained 06/21/2020 0508 by Patrecia Pace, RN Outcome: Progressing 06/20/2020 2111 by Patrecia Pace, RN Outcome: Not Progressing   Problem: Coping: Goal: Level  of anxiety will decrease 06/21/2020 0508 by Patrecia Pace, RN Outcome: Progressing 06/20/2020 2111 by Patrecia Pace, RN Outcome: Not Progressing   Problem: Elimination: Goal: Will not experience complications related to bowel motility 06/21/2020 0508 by Patrecia Pace, RN Outcome: Progressing 06/20/2020 2111 by Patrecia Pace, RN Outcome: Not Progressing Goal: Will not experience complications related to urinary retention 06/21/2020 0508 by Patrecia Pace, RN Outcome: Progressing 06/20/2020 2111 by Patrecia Pace, RN Outcome: Not Progressing   Problem: Pain Managment: Goal: General experience of comfort will improve 06/21/2020 0508 by Patrecia Pace, RN Outcome: Progressing 06/20/2020 2111 by Patrecia Pace, RN Outcome: Not Progressing   Problem: Safety: Goal: Ability to remain free from injury will improve 06/21/2020 0508 by Patrecia Pace, RN Outcome: Progressing 06/20/2020 2111 by Patrecia Pace, RN Outcome: Not Progressing   Problem: Skin Integrity: Goal: Risk for impaired skin integrity will decrease 06/21/2020 0508 by Patrecia Pace, RN Outcome: Progressing 06/20/2020 2111 by Patrecia Pace, RN Outcome: Not Progressing

## 2020-06-21 NOTE — Progress Notes (Addendum)
Long talk with wife.  FTT and weight loss after ACS in Sept 2021.  Some of weight loss seems volitional and related to dperession. CT Chest benign in Dec 2021.  Now with what I suspect is persistent RLL collapse with endobronchial filling defect.  Will look at with CT; if persistent RLL collapse will do diagnostic/therapeutic bronch with intubation/mechanical ventilation.  Provided we can relieve the obstruction, patient will then have to demonstrate will to eat and rehabilitate; otherwise we will have nothing left to offer but hospice.  Will have palliative establish relationship with patient and family in interim.  Additional 64 minutes cc time spent reviewing prior records, speaking with family and explaining care plan.  Erskine Emery MD PCCM

## 2020-06-21 NOTE — Progress Notes (Signed)
NAME:  Benjamin Robertson, MRN:  [id number], DOB:  1950-04-08, LOS: 3 ADMISSION DATE:  07/04/2020, CONSULTATION DATE: 06/18/2020 REFERRING MD: Sharion Settler, NP, CHIEF COMPLAINT: Shortness of Breath  History of Present Illness:  This is a 70 yo male who presented to [facility] ER on 04/7 with a 2 day hx of shortness of breath and chest congestion.  He also endorsed a nonproductive cough.  ED course  Upon arrival to the ER Respiratory Panel by RT-PCR negative, however CXR concerning for RLL pneumonia and probable small effusion.  CTA Chest negative for PE, however concerning for pneumonia.  He received ceftriaxone and azithromycin.  He was subsequently admitted to the [facility] unit for additional workup and treatment per hospitalist team.  However, on 04/08 pt required transfer to the stepdown unit for Bipap and PCCM team consulted to assist with management.    Pertinent  Medical History  Pneumonia  Obesity  Kidney Stones Ischemic Cardiomyopathy  HLD HTN Hemorrhoids  Childhood Asthma CAD s/p CABG x3 Arthritis   Significant Hospital Events: Including procedures, antibiotic start and stop dates in addition to other pertinent events   . Pt admitted to the medsurg unit with pneumonia 04/7 . Pt transferred to the stepdown unit 04/8 with worsening respiratory failure requiring Bipap  . 06/19/2020 very tenacious secretions, persistent collapse right lower lobe initiated MetaNeb  Interim History / Subjective:  Continues to c/o secretion clearance issues when he is awake despite current pulmonary toileting measures.  Objective   Blood pressure 120/71, pulse (!) 52, temperature (!) 97 F (36.1 C), resp. rate 13, height 5\' 8"  (1.727 m), weight 75.5 kg, SpO2 98 %.    FiO2 (%):  [60 %] 60 %   Intake/Output Summary (Last 24 hours) at 06/21/2020 0802 Last data filed at 06/21/2020 0700 Gross per 24 hour  Intake 940.62 ml  Output 3000 ml  Net -2059.38 ml   Filed Weights   06/14/2020 1059 06/18/20 0525  06/21/20 0000  Weight: 74.8 kg 76.6 kg 75.5 kg    Examination: Constitutional: anxious man on HFNC  Eyes: EOMI, pupils eqaul Ears, nose, mouth, and throat: [place] in place, MMM, trachea midline Cardiovascular: bradycardic, ext warm Respiratory: has diminished breath sounds BL and focal increased breath sounds around glottis, retained secretions vs. VCD Gastrointestinal: soft, +BS Skin: No rashes, normal turgor Neurologic: moves all 4 ext to command Psychiatric: anxious  BMP stable CBC stable   Labs/imaging that I havepersonally reviewed  (right click and "Reselect all SmartList Selections" daily)  04/7: Respiratory Panel by RT-PCR negative, however CXR concerning for RLL pneumonia and probable small effusion 04/7: CTA Chest negative for PE, however concerning for pneumonia 04/7: Azithromycin 04/7>> 04/7: Ceftriaxone 04/7>>  Resolved Hospital Problem list   N/A  Assessment & Plan:   Acute on chronic hypoxic respiratory failure secondary to pneumonia and AECOPD Metabolic encephalopathy due to Anxiety/ ICU delirium Incomplete secretion clearance +/- VCD  - Start klonipin, wean precedex - Continue trazadone qHS to assist sleep - CPT and add hypertonic saline to help mobilize secretions - CAP coverage as ordered although Pct neg could just be noninfectious mucus plugging - Add brovana/yupelri to pulmicort, change albuterol to PRN - Steroids as ordered for now   Best practice (right click and "Reselect all SmartList Selections" daily)  Diet:  Oral Pain/Anxiety/Delirium protocol (if indicated): No VAP protocol (if indicated): Not indicated DVT prophylaxis: LMWH GI prophylaxis: N/A Glucose control:  SSI No Central venous access:  N/A Arterial line:  N/A Foley:  N/A Mobility:  bed rest  PT consulted: N/A Last date of multidisciplinary goals of care discussion [N/A] Patient's family and patient were updated at bedside by MD 06/19/2020 Code Status:  full code Disposition:  ICU pending precedex liberation   Patient critically ill due to respiratory failure, delirium vs. anxiety Interventions to address this today precedex wean Risk of deterioration without these interventions is high  I personally spent 34 minutes providing critical care not including any separately billable procedures  Erskine Emery MD [facility] [place] epic messenger for cross cover needs If after hours, please call E-link

## 2020-06-21 NOTE — Progress Notes (Signed)
SLP Cancellation Note  Patient Details Name: Benjamin Robertson MRN: [id number] DOB: Jul 27, 1950   Cancelled treatment:       Reason Eval/Treat Not Completed: Medical issues which prohibited therapy  Pt's nurse states that is going down for a repeat scan to re-assess his PNA. Based on imaging, he may have a different interm POC that would make him NPO. I will check pt's chart tomorrow for POC.   Shomari Scicchitano B. Rutherford Nail M.S., CCC-SLP, [place] Office [phone]  Stormy Fabian 06/21/2020, 12:46 PM

## 2020-06-22 ENCOUNTER — Inpatient Hospital Stay: Payer: Medicare Other

## 2020-06-22 DIAGNOSIS — J189 Pneumonia, unspecified organism: Secondary | ICD-10-CM | POA: Diagnosis not present

## 2020-06-22 DIAGNOSIS — T17500A Unspecified foreign body in bronchus causing asphyxiation, initial encounter: Secondary | ICD-10-CM | POA: Diagnosis not present

## 2020-06-22 DIAGNOSIS — J9601 Acute respiratory failure with hypoxia: Secondary | ICD-10-CM | POA: Diagnosis not present

## 2020-06-22 LAB — BODY FLUID CELL COUNT WITH DIFFERENTIAL
Eos, Fluid: 0 %
Lymphs, Fluid: 8 %
Monocyte-Macrophage-Serous Fluid: 13 % — ABNORMAL LOW (ref 50–90)
Neutrophil Count, Fluid: 79 % — ABNORMAL HIGH (ref 0–25)
Total Nucleated Cell Count, Fluid: 24 cu mm (ref 0–1000)

## 2020-06-22 LAB — CBC
HCT: 50 % (ref 39.0–52.0)
Hemoglobin: 17.2 g/dL — ABNORMAL HIGH (ref 13.0–17.0)
MCH: 32.3 pg (ref 26.0–34.0)
MCHC: 34.4 g/dL (ref 30.0–36.0)
MCV: 93.8 fL (ref 80.0–100.0)
Platelets: 257 10*3/uL (ref 150–400)
RBC: 5.33 MIL/uL (ref 4.22–5.81)
RDW: 12.3 % (ref 11.5–15.5)
WBC: 8.9 10*3/uL (ref 4.0–10.5)
nRBC: 0 % (ref 0.0–0.2)

## 2020-06-22 LAB — MAGNESIUM
Magnesium: 2.2 mg/dL (ref 1.7–2.4)
Magnesium: 2.4 mg/dL (ref 1.7–2.4)
Magnesium: 2.2 mg/dL (ref 1.7–2.4)

## 2020-06-22 LAB — BLOOD GAS, ARTERIAL
Acid-Base Excess: 7.5 mmol/L — ABNORMAL HIGH (ref 0.0–2.0)
Bicarbonate: 32.7 mmol/L — ABNORMAL HIGH (ref 20.0–28.0)
FIO2: 0.45
MECHVT: 540 mL
O2 Saturation: 93.5 %
PEEP: 5 cmH2O
Patient temperature: 37
RATE: 18 resp/min
pCO2 arterial: 46 mmHg (ref 32.0–48.0)
pH, Arterial: 7.46 — ABNORMAL HIGH (ref 7.350–7.450)
pO2, Arterial: 65 mmHg — ABNORMAL LOW (ref 83.0–108.0)

## 2020-06-22 LAB — PHOSPHORUS
Phosphorus: 3.8 mg/dL (ref 2.5–4.6)
Phosphorus: 3.7 mg/dL (ref 2.5–4.6)
Phosphorus: 3.8 mg/dL (ref 2.5–4.6)

## 2020-06-22 LAB — BASIC METABOLIC PANEL
Anion gap: 8 (ref 5–15)
BUN: 22 mg/dL (ref 8–23)
CO2: 32 mmol/L (ref 22–32)
Calcium: 9 mg/dL (ref 8.9–10.3)
Chloride: 96 mmol/L — ABNORMAL LOW (ref 98–111)
Creatinine, Ser: 0.75 mg/dL (ref 0.61–1.24)
GFR, Estimated: >60 mL/min (ref >60)
Glucose, Bld: 116 mg/dL — ABNORMAL HIGH (ref 70–99)
Potassium: 4.1 mmol/L (ref 3.5–5.1)
Sodium: 136 mmol/L (ref 135–145)

## 2020-06-22 LAB — GLUCOSE, CAPILLARY
Glucose-Capillary: 108 mg/dL — ABNORMAL HIGH (ref 70–99)
Glucose-Capillary: 143 mg/dL — ABNORMAL HIGH (ref 70–99)
Glucose-Capillary: 172 mg/dL — ABNORMAL HIGH (ref 70–99)

## 2020-06-22 MED ORDER — MIDAZOLAM HCL 2 MG/2ML IJ SOLN
4.0000 mg | Freq: Once | INTRAMUSCULAR | Status: AC
  Administered 2020-06-22: 4 mg via INTRAVENOUS
  Filled 2020-06-22: qty 4

## 2020-06-22 MED ORDER — LACTATED RINGERS IV BOLUS
500.0000 mL | Freq: Once | INTRAVENOUS | Status: AC
  Administered 2020-06-22: 500 mL via INTRAVENOUS

## 2020-06-22 MED ORDER — PROSOURCE TF PO LIQD
45.0000 mL | Freq: Two times a day (BID) | ORAL | Status: DC
  Administered 2020-06-22 (×2): 45 mL

## 2020-06-22 MED ORDER — VITAL AF 1.2 CAL PO LIQD
1000.0000 mL | ORAL | Status: DC
  Administered 2020-06-22: 1000 mL

## 2020-06-22 MED ORDER — NOREPINEPHRINE 4 MG/250ML-% IV SOLN
2.0000 ug/min | INTRAVENOUS | Status: DC
  Administered 2020-06-22: 2 ug/min via INTRAVENOUS
  Filled 2020-06-22: qty 250

## 2020-06-22 MED ORDER — FENTANYL 2500MCG IN NS 250ML (10MCG/ML) PREMIX INFUSION
0.0000 ug/h | INTRAVENOUS | Status: DC
  Administered 2020-06-22: 100 ug/h via INTRAVENOUS
  Filled 2020-06-22: qty 250

## 2020-06-22 MED ORDER — ORAL CARE MOUTH RINSE
15.0000 mL | OROMUCOSAL | Status: DC
  Administered 2020-06-22 – 2020-06-23 (×8): 15 mL via OROMUCOSAL

## 2020-06-22 MED ORDER — MIDAZOLAM HCL 2 MG/2ML IJ SOLN
2.0000 mg | Freq: Once | INTRAMUSCULAR | Status: AC
  Administered 2020-06-22: 2 mg via INTRAVENOUS

## 2020-06-22 MED ORDER — ROCURONIUM BROMIDE 50 MG/5ML IV SOLN
80.0000 mg | Freq: Once | INTRAVENOUS | Status: AC
  Administered 2020-06-22: 80 mg via INTRAVENOUS
  Filled 2020-06-22: qty 2

## 2020-06-22 MED ORDER — CHLORHEXIDINE GLUCONATE 0.12% ORAL RINSE (MEDLINE KIT)
15.0000 mL | Freq: Two times a day (BID) | OROMUCOSAL | Status: DC
  Administered 2020-06-22 – 2020-06-23 (×3): 15 mL via OROMUCOSAL

## 2020-06-22 MED ORDER — FREE WATER
30.0000 mL | Status: DC
  Administered 2020-06-22 – 2020-06-23 (×6): 30 mL

## 2020-06-22 MED ORDER — PROPOFOL 1000 MG/100ML IV EMUL
5.0000 ug/kg/min | INTRAVENOUS | Status: DC
  Administered 2020-06-22: 10 ug/kg/min via INTRAVENOUS
  Administered 2020-06-22 (×2): 30 ug/kg/min via INTRAVENOUS
  Administered 2020-06-23 (×2): 50 ug/kg/min via INTRAVENOUS
  Filled 2020-06-22 (×5): qty 100

## 2020-06-22 MED ORDER — ETOMIDATE 2 MG/ML IV SOLN
20.0000 mg | Freq: Once | INTRAVENOUS | Status: AC
  Administered 2020-06-22: 20 mg via INTRAVENOUS

## 2020-06-22 MED ORDER — MIDAZOLAM HCL 2 MG/2ML IJ SOLN
INTRAMUSCULAR | Status: AC
  Filled 2020-06-22: qty 2

## 2020-06-22 MED ORDER — SODIUM CHLORIDE 0.9 % IV SOLN: 250.0000 mL | INTRAVENOUS | Status: DC

## 2020-06-22 MED ORDER — VITAL HIGH PROTEIN PO LIQD: 1000.0000 mL | ORAL | Status: DC

## 2020-06-22 NOTE — Progress Notes (Signed)
Spoke with Westley Chandler, bladder scanned patient 237 ml. No urine output. Patient had incontinent episode this am. Orders for fluid bolus. Continue to assess.

## 2020-06-22 NOTE — Progress Notes (Signed)
   06/22/20 1210  Clinical Encounter Type  Visited With Patient and family together  Visit Type Initial;Spiritual support;Social support;Critical Care  Referral From Chaplain  Consult/Referral To [place] encountered PT and his family while doing rounds. His wife and son were by his bedside and were able to express their emotions. The wife spoke of her husband's health improving today. Chaplain ministered with spiritual support, emotional support, and prayer.

## 2020-06-22 NOTE — Progress Notes (Signed)
SLP Cancellation Note  Patient Details Name: JAEVION GOTO MRN: [id number] DOB: [birth year]   Cancelled treatment:       Reason Eval/Treat Not Completed: Medical issues which prohibited therapy  Pt currently intubated. Please re-consult when pt is appropriate.   Aryiah Monterosso B. Rutherford Nail M.S., CCC-SLP, [place] Office [phone]  Stormy Fabian 06/22/2020, 11:35 AM

## 2020-06-22 NOTE — Progress Notes (Signed)
Patient Intubated by RT under direct supervision of Dr. Tamala Julian, with no complications. Patient intubated with a #8.5 oral ET tube with the assistance of the glidescope. Positive color change noted with capnometer, bilateral chest rise, and condensation seen in ET tube. Tube secured at 24cm at the lip with a commercial tube holder.

## 2020-06-22 NOTE — Procedures (Signed)
Bronchoscopy Procedure Note  Benjamin Robertson  [id number]  [birth year]  Date:06/22/20  Time:11:06 AM   Provider Performing:Helmer Dull C Tamala Julian   Procedure(s):  Flexible bronchoscopy with bronchial alveolar lavage [phone]) and Initial Therapeutic Aspiration of Tracheobronchial Tree [phone])  Indication(s) Abnormal x-ray, respiratory failure, pneumonia  Consent Risks of the procedure as well as the alternatives and risks of each were explained to the patient and/or caregiver.  Consent for the procedure was obtained and is signed in the bedside chart  Anesthesia In place from intubation   Time Out Verified patient identification, verified procedure, site/side was marked, verified correct patient position, special equipment/implants available, medications/allergies/relevant history reviewed, required imaging and test results available.   Sterile Technique Usual hand hygiene, masks, gowns, and gloves were used   Procedure Description Bronchoscope advanced through endotracheal tube and into airway.  Airways were examined down to subsegmental level with findings noted below.   Following diagnostic evaluation, BAL(s) performed in RLL with normal saline and return of clear fluid with plugs fluid and Therapeutic aspiration performed in right lower lobe where a large mucus plug was found  Findings:  - ETT in good position - Copious thick mucoid secretions - Complete obstruction of RLL with thick mucus plug, suctioned successfully with distal airways open, BAL performed after therapeutic suctioning   Complications/Tolerance None; patient tolerated the procedure well. Chest X-ray is not needed post procedure.   EBL Minimal   Specimen(s) RLL BAL

## 2020-06-22 NOTE — Procedures (Signed)
Central Venous Catheter Insertion Procedure Note  Benjamin Robertson  [id number]  18-Feb-1951  Date:06/22/20  Time:10:27 AM   Provider Performing:Karyss Frese D Dewaine Conger   Procedure: Insertion of Non-tunneled Central Venous [phone]) with US guidance (19509)   Indication(s) Medication administration and Difficult access  Consent Risks of the procedure as well as the alternatives and risks of each were explained to the patient and/or caregiver.  Consent for the procedure was obtained and is signed in the bedside chart  Anesthesia Topical only with 1% lidocaine   Timeout Verified patient identification, verified procedure, site/side was marked, verified correct patient position, special equipment/implants available, medications/allergies/relevant history reviewed, required imaging and test results available.  Sterile Technique Maximal sterile technique including full sterile barrier drape, hand hygiene, sterile gown, sterile gloves, mask, hair covering, sterile ultrasound probe cover (if used).  Procedure Description Area of catheter insertion was cleaned with chlorhexidine and draped in sterile fashion.  With real-time ultrasound guidance a central venous catheter was placed into the right internal jugular vein. Nonpulsatile blood flow and easy flushing noted in all ports.  The catheter was sutured in place and sterile dressing applied.  Complications/Tolerance None; patient tolerated the procedure well. Chest X-ray is ordered to verify placement for internal jugular or subclavian cannulation.   Chest x-ray is not ordered for femoral cannulation.  EBL Minimal  Specimen(s) None  Line secured at the 16 cm mark.   BIOPATCH applied to the insertion site.   [name], AGACNP-BC [facility] Prefer epic messenger for cross cover needs If after hours, please call E-link

## 2020-06-22 NOTE — Progress Notes (Signed)
Nutrition Follow-up  DOCUMENTATION CODES:   Non-severe (moderate) malnutrition in context of chronic illness  INTERVENTION:   Vital 1.2@60ml /hr- Initiate at 48ml/hr and increase by 75ml/hr q 8 hours until goal rate is reached.   Pro-Source 57ml BID via tube, provides 40kcal and 11g of protein per serving   Free water flushes 87ml q4 hours to maintain tube patency   Regimen provides 1808kcal/day, 130g/day protein and 1328ml/day free water   Pt at high refeed risk; recommend monitor potassium, magnesium and phosphorus labs daily until stable  NUTRITION DIAGNOSIS:   Moderate Malnutrition related to chronic illness (COPD) as evidenced by mild fat depletion,moderate fat depletion,severe muscle depletion,percent weight loss.  GOAL:   Patient will meet greater than or equal to 90% of their needs  MONITOR:   PO intake,Supplement acceptance,Labs,Weight trends,Skin,I & O's  REASON FOR ASSESSMENT:   Malnutrition Screening Tool    ASSESSMENT:   70 y.o. male with medical history significant for COPD stage I/II, history of tobacco use, CAD status post CABG x3 after PCI x2 to the RCA (lima-lad, svg-diagonal svg-ri), grafts to the ramus intermedius, and LAD-diagonal as well as stents in the native RCA and left main circumflex, hypertension, depression/anxiety, presents to the emergency department for chief concerns of shortness of breath.   Pt sedated and intubated. NGT in place. Plan is to start tube feeds today. Pt is at high refeed risk. Pt has continued to have poor appetite and oral intake in hospital. Pt is drinking some Ensure. Per chart, pt has remained fairly weight stable since admit.   Medications reviewed and include: plavix, lovenox, Mg oxide, solu-medrol, MVI, protonix, miralax, senokot, ceftriaxone, levophed, propofol  Labs reviewed: K 4.1 wnl, P 3.7 wnl, Mg 2.4 wnl  Patient is currently intubated on ventilator support MV: 9.0 L/min Temp (24hrs), Avg:98.5 F (36.9 C),  Min:97.8 F (36.6 C), Max:99.1 F (37.3 C)  Propofol: 4.53 ml/hr- provides 120kcal/day   MAP > 90mmHg  UOP- 1834ml  Diet Order:   Diet Order            Diet regular Room service appropriate? Yes; Fluid consistency: Thin  Diet effective now                EDUCATION NEEDS:   Education needs have been addressed  Skin:  Skin Assessment: Reviewed RN Assessment  Last BM:  4/12  Height:   Ht Readings from Last 1 Encounters:  06/18/20 5\' 8"  (1.727 m)    Weight:   Wt Readings from Last 1 Encounters:  06/21/20 75.5 kg    Ideal Body Weight:  70 kg  BMI:  Body mass index is 25.31 kg/m.  Estimated Nutritional Needs:   Kcal:  1740kcal/day  Protein:  115-130g/day  Fluid:  1.8-2.1L/day  Koleen Distance MS, RD, LDN Please refer to [facility] for RD and/or RD on-call/weekend/after hours pager

## 2020-06-22 NOTE — Progress Notes (Signed)
Per Dr. Tamala Julian ok to use central line and dob-hoff.

## 2020-06-22 NOTE — Progress Notes (Signed)
RT assisted with bedside bronchoscopy with the AMBU bronchoscope. Time out performed at bedside by Dr. Tamala Julian prior to intubation and bronchoscopy. Time Sope in: 0922 Time Scope out: 926. RLL BAL obtained and sent to lab for analysis. No complications were noted during procedure.

## 2020-06-22 NOTE — Progress Notes (Signed)
NAME:  Benjamin Robertson, MRN:  [id number], DOB:  November 27, 1950, LOS: 4 ADMISSION DATE:  06/23/2020, CONSULTATION DATE: 06/18/2020 REFERRING MD: Sharion Settler, NP, CHIEF COMPLAINT: Shortness of Breath  History of Present Illness:  This is a 70 yo male who presented to [facility] ER on 04/7 with a 2 day hx of shortness of breath and chest congestion.  He also endorsed a nonproductive cough.  ED course  Upon arrival to the ER Respiratory Panel by RT-PCR negative, however CXR concerning for RLL pneumonia and probable small effusion.  CTA Chest negative for PE, however concerning for pneumonia.  He received ceftriaxone and azithromycin.  He was subsequently admitted to the [facility] unit for additional workup and treatment per hospitalist team.  However, on 04/08 pt required transfer to the stepdown unit for Bipap and PCCM team consulted to assist with management.    Pertinent  Medical History  Pneumonia  Obesity  Kidney Stones Ischemic Cardiomyopathy  HLD HTN Hemorrhoids  Childhood Asthma CAD s/p CABG x3 Arthritis   Significant Hospital Events: Including procedures, antibiotic start and stop dates in addition to other pertinent events   . Pt admitted to the medsurg unit with pneumonia 04/7 . Pt transferred to the stepdown unit 04/8 with worsening respiratory failure requiring Bipap  . 06/19/2020 very tenacious secretions, persistent collapse right lower lobe initiated MetaNeb . 4/10 >> ongoing pulmonary toileting efforts  Interim History / Subjective:  Continues to have feelings of retained secretions.   Objective   Blood pressure (!) 106/59, pulse 77, temperature 98.5 F (36.9 C), resp. rate 15, height 5\' 8"  (1.727 m), weight 75.5 kg, SpO2 96 %.    FiO2 (%):  [45 %-54 %] 45 %   Intake/Output Summary (Last 24 hours) at 06/22/2020 0737 Last data filed at 06/22/2020 0300 Gross per 24 hour  Intake 320.18 ml  Output 1825 ml  Net -1504.82 ml   Filed Weights   06/11/2020 1059 06/18/20 0525 06/21/20  0000  Weight: 74.8 kg 76.6 kg 75.5 kg    Examination: Constitutional: anxious man on HFNC  Eyes: EOMI, pupils equal Ears, nose, mouth, and throat: MM dry, HFNC in place Cardiovascular: RRR, ext warm Respiratory: diminished with rhonci bilaterally Gastrointestinal: soft, hypoactive BS Skin: No rashes, normal turgor Neurologic: anxious, moves all 4 ext Psychiatric: RASS 0  Net neg 1500 Labs look okay   Labs/imaging that I havepersonally reviewed  (right click and "Reselect all SmartList Selections" daily)  04/7: Respiratory Panel by RT-PCR negative, however CXR concerning for RLL pneumonia and probable small effusion 04/7: CTA Chest negative for PE, however concerning for pneumonia 04/7: Azithromycin 04/7>> 04/7: Ceftriaxone 04/7>>  Resolved Hospital Problem list   N/A  Assessment & Plan:   Acute on chronic hypoxic respiratory failure secondary to persistent RLL endobronchial obstruction Metabolic encephalopathy due to Anxiety/ ICU delirium Incomplete secretion clearance +/- VCD FTT, anorexia x 5 months since admission for ACS  - See discussion yesterday, will proceed with diagnostic and therapeutic bronchoscopy with potential biopsy - Probably will let rest today and get dobhoff to start nutrition - Pilar Plate conversation with wife yesterday that if we cannot stop FTT with bronch and artificial nutrition, there will be nothing left to offer   Best practice (right click and "Reselect all SmartList Selections" daily)  Diet:  Oral Pain/Anxiety/Delirium protocol (if indicated): No VAP protocol (if indicated): Not indicated DVT prophylaxis: LMWH GI prophylaxis: N/A Glucose control:  SSI No Central venous access:  N/A Arterial line:  N/A Foley:  N/A Mobility:  bed rest  PT consulted: N/A Last date of multidisciplinary goals of care discussion [N/A] Patient's family and patient were updated at bedside by MD 06/21/2020 Code Status:  full code Disposition: ICU   Patient  critically ill due to respiratory failure, abnormal CT Interventions to address this today intubation, bronch, sedation Risk of deterioration without these interventions is high  I personally spent 44 minutes providing critical care not including any separately billable procedures  Erskine Emery MD [facility] [place] epic messenger for cross cover needs If after hours, please call E-link

## 2020-06-22 NOTE — Progress Notes (Signed)
eLink Physician-Brief Progress Note Patient Name: Benjamin Robertson DOB: 24-Jun-1950 MRN: [id number]   Date of Service  06/22/2020  HPI/Events of Note  Oliguria - Bladder scan with 500 mL residual. Nursing request for I/O cath.   eICU Interventions  Plan: 1. I/O cath PRN.      Intervention Category Major Interventions: Other:  Lysle Dingwall 06/22/2020, 11:34 PM

## 2020-06-22 NOTE — Progress Notes (Signed)
Dr. Tamala Julian notified no urine output after bolus. Orders to bladder scan patient at 20:00. Continue to assess/

## 2020-06-22 NOTE — Procedures (Signed)
Intubation Procedure Note  Benjamin Robertson  [id number]  [birth year]  Date:06/22/20  Time:9:30 AM   Provider Performing Liborio Nixon under direct supervision of Dr. Ina Homes   Procedure: Intubation (31500)  Indication(s) Respiratory Failure  Consent Risks of the procedure as well as the alternatives and risks of each were explained to the patient and/or caregiver.  Consent for the procedure was obtained and is signed in the bedside chart   Anesthesia Etomidate, Versed and Rocuronium   Time Out Verified patient identification, verified procedure, site/side was marked, verified correct patient position, special equipment/implants available, medications/allergies/relevant history reviewed, required imaging and test results available.   Sterile Technique Usual hand hygeine, masks, and gloves were used   Procedure Description Patient positioned in bed supine.  Sedation given as noted above.  Patient was intubated with endotracheal tube using Glidescope.  View was Grade 1 full glottis .  Number of attempts was 1.  Colorimetric CO2 detector was consistent with tracheal placement.   Complications/Tolerance None; patient tolerated the procedure well. Chest X-ray is ordered to verify placement.   EBL Minimal   Specimen(s) None

## 2020-06-23 DIAGNOSIS — E440 Moderate protein-calorie malnutrition: Secondary | ICD-10-CM | POA: Diagnosis not present

## 2020-06-23 DIAGNOSIS — J449 Chronic obstructive pulmonary disease, unspecified: Secondary | ICD-10-CM | POA: Diagnosis not present

## 2020-06-23 DIAGNOSIS — J9601 Acute respiratory failure with hypoxia: Secondary | ICD-10-CM | POA: Diagnosis not present

## 2020-06-23 DIAGNOSIS — J189 Pneumonia, unspecified organism: Secondary | ICD-10-CM | POA: Diagnosis not present

## 2020-06-23 LAB — BASIC METABOLIC PANEL
Anion gap: 7 (ref 5–15)
BUN: 32 mg/dL — ABNORMAL HIGH (ref 8–23)
CO2: 31 mmol/L (ref 22–32)
Calcium: 9.1 mg/dL (ref 8.9–10.3)
Chloride: 98 mmol/L (ref 98–111)
Creatinine, Ser: 0.72 mg/dL (ref 0.61–1.24)
GFR, Estimated: >60 mL/min (ref >60)
Glucose, Bld: 151 mg/dL — ABNORMAL HIGH (ref 70–99)
Potassium: 4.8 mmol/L (ref 3.5–5.1)
Sodium: 136 mmol/L (ref 135–145)

## 2020-06-23 LAB — GLUCOSE, CAPILLARY
Glucose-Capillary: 133 mg/dL — ABNORMAL HIGH (ref 70–99)
Glucose-Capillary: 146 mg/dL — ABNORMAL HIGH (ref 70–99)
Glucose-Capillary: 107 mg/dL — ABNORMAL HIGH (ref 70–99)
Glucose-Capillary: 131 mg/dL — ABNORMAL HIGH (ref 70–99)

## 2020-06-23 LAB — CBC
HCT: 45.2 % (ref 39.0–52.0)
Hemoglobin: 15.2 g/dL (ref 13.0–17.0)
MCH: 32 pg (ref 26.0–34.0)
MCHC: 33.6 g/dL (ref 30.0–36.0)
MCV: 95.2 fL (ref 80.0–100.0)
Platelets: 227 10*3/uL (ref 150–400)
RBC: 4.75 MIL/uL (ref 4.22–5.81)
RDW: 12.1 % (ref 11.5–15.5)
WBC: 8.2 10*3/uL (ref 4.0–10.5)
nRBC: 0 % (ref 0.0–0.2)

## 2020-06-23 LAB — TRIGLYCERIDES: Triglycerides: 139 mg/dL (ref <150)

## 2020-06-23 LAB — MAGNESIUM: Magnesium: 2.3 mg/dL (ref 1.7–2.4)

## 2020-06-23 LAB — PATHOLOGIST SMEAR REVIEW

## 2020-06-23 LAB — PHOSPHORUS: Phosphorus: 4.3 mg/dL (ref 2.5–4.6)

## 2020-06-23 MED ORDER — OSMOLITE 1.5 CAL PO LIQD: 1000.0000 mL | ORAL | Status: DC

## 2020-06-23 MED ORDER — MORPHINE SULFATE (PF) 2 MG/ML IV SOLN: 2.0000 mg | INTRAVENOUS | Status: DC | PRN

## 2020-06-23 MED ORDER — GLYCOPYRROLATE 1 MG PO TABS
1.0000 mg | ORAL_TABLET | ORAL | Status: DC | PRN
  Filled 2020-06-23: qty 1

## 2020-06-23 MED ORDER — MORPHINE 100MG IN NS 100ML (1MG/ML) PREMIX INFUSION
0.0000 mg/h | INTRAVENOUS | Status: DC
  Administered 2020-06-23: 5 mg/h via INTRAVENOUS
  Administered 2020-06-24: 10 mg/h via INTRAVENOUS
  Filled 2020-06-23 (×3): qty 100

## 2020-06-23 MED ORDER — ACETAMINOPHEN 325 MG PO TABS: 650.0000 mg | ORAL_TABLET | Freq: Four times a day (QID) | ORAL | Status: DC | PRN

## 2020-06-23 MED ORDER — MORPHINE BOLUS VIA INFUSION
5.0000 mg | INTRAVENOUS | Status: DC | PRN
  Administered 2020-06-23 (×2): 5 mg via INTRAVENOUS
  Filled 2020-06-23: qty 5

## 2020-06-23 MED ORDER — GLYCOPYRROLATE 0.2 MG/ML IJ SOLN: 0.2000 mg | INTRAMUSCULAR | Status: DC | PRN

## 2020-06-23 MED ORDER — ACETAMINOPHEN 650 MG RE SUPP: 650.0000 mg | Freq: Four times a day (QID) | RECTAL | Status: DC | PRN

## 2020-06-23 MED ORDER — FREE WATER: 160.0000 mL | Status: DC

## 2020-06-23 MED ORDER — MIDAZOLAM HCL 2 MG/2ML IJ SOLN
1.0000 mg | Freq: Once | INTRAMUSCULAR | Status: AC
  Administered 2020-06-23: 1 mg via INTRAVENOUS
  Filled 2020-06-23: qty 2

## 2020-06-23 MED ORDER — POLYVINYL ALCOHOL 1.4 % OP SOLN
1.0000 [drp] | Freq: Four times a day (QID) | OPHTHALMIC | Status: DC | PRN
  Filled 2020-06-23: qty 15

## 2020-06-23 MED ORDER — SODIUM CHLORIDE 0.9 % IV SOLN
3.0000 g | Freq: Three times a day (TID) | INTRAVENOUS | Status: DC
  Administered 2020-06-23: 3 g via INTRAVENOUS
  Filled 2020-06-23: qty 8
  Filled 2020-06-23: qty 3
  Filled 2020-06-23 (×2): qty 8

## 2020-06-23 MED ORDER — DIPHENHYDRAMINE HCL 50 MG/ML IJ SOLN: 25.0000 mg | INTRAMUSCULAR | Status: DC | PRN

## 2020-06-23 MED ORDER — LORAZEPAM 2 MG/ML IJ SOLN
2.0000 mg | INTRAMUSCULAR | Status: DC | PRN
Start: 2020-06-23 — End: 2020-06-24
  Administered 2020-06-23: 22:00:00 2 mg via INTRAVENOUS
  Filled 2020-06-23: qty 1

## 2020-06-23 MED ORDER — BETHANECHOL CHLORIDE 10 MG PO TABS
10.0000 mg | ORAL_TABLET | Freq: Three times a day (TID) | ORAL | Status: DC
  Administered 2020-06-23: 10 mg
  Filled 2020-06-23 (×3): qty 1

## 2020-06-23 MED ORDER — PROSOURCE TF PO LIQD: 45.0000 mL | Freq: Three times a day (TID) | ORAL | Status: DC

## 2020-06-23 NOTE — Progress Notes (Signed)
Patient extubated 9:30. Placed on 4 liters. Oxygenating 88-90%. No complaints of pain or shortness of breath.Orders for SLP, physical therapy.

## 2020-06-23 NOTE — Progress Notes (Addendum)
NAME:  Benjamin Robertson, MRN:  [id number], DOB:  09-13-1950, LOS: 5 ADMISSION DATE:  06/12/2020, CONSULTATION DATE: 06/18/2020 REFERRING MD: Sharion Settler, NP, CHIEF COMPLAINT: Shortness of Breath  History of Present Illness:  This is a 70 yo male who presented to [facility] ER on 04/7 with a 2 day hx of shortness of breath and chest congestion.  He also endorsed a nonproductive cough.  Upon arrival to the ER Respiratory Panel by RT-PCR negative, however CXR concerning for RLL pneumonia and probable small effusion.  CTA Chest negative for PE, however concerning for pneumonia.  He received ceftriaxone and azithromycin.  He was subsequently admitted to the [facility] unit for additional workup and treatment per hospitalist team.  However, on 04/08 pt required transfer to the stepdown unit for Bipap and PCCM team consulted to assist with management.    Pertinent  Medical History  Pneumonia  Obesity  Kidney Stones Ischemic Cardiomyopathy  HLD HTN Hemorrhoids  Childhood Asthma CAD s/p CABG x3 Arthritis   Significant Hospital Events: Including procedures, antibiotic start and stop dates in addition to other pertinent events   . Pt admitted to the medsurg unit with pneumonia 04/7 . Pt transferred to the stepdown unit 04/8 with worsening respiratory failure requiring Bipap  . 06/19/2020 very tenacious secretions, persistent collapse right lower lobe initiated MetaNeb . 4/10 >> ongoing pulmonary toileting efforts . 4/13 Extubated  Interim History / Subjective:  Patient tolerated a spontaneous breathing trial, extubated this morning Currently requiring 4 to 5 L oxygen to maintain O2 sat above 88%  Objective   Blood pressure (!) 158/101, pulse 96, temperature 97.6 F (36.4 C), resp. rate 15, height 5\' 8"  (1.727 m), weight 75.5 kg, SpO2 (!) 86 %.    Vent Mode: PSV FiO2 (%):  [35 %-45 %] 35 % Set Rate:  [16 bmp] 16 bmp Vt Set:  [540 mL] 540 mL PEEP:  [5 cmH20] 5 cmH20 Pressure Support:  [5 cmH20] 5  cmH20 Plateau Pressure:  [20 UYQ03-47 cmH20] 21 cmH20   Intake/Output Summary (Last 24 hours) at 06/23/2020 1155 Last data filed at 06/23/2020 1114 Gross per 24 hour  Intake 585.33 ml  Output 775 ml  Net -189.67 ml   Filed Weights   06/15/2020 1059 06/18/20 0525 06/21/20 0000  Weight: 74.8 kg 76.6 kg 75.5 kg    Examination: Constitutional: Elderly Caucasian male, looks chronically ill, lying in the bed Eyes: EOMI, pupils equal Ears, nose, mouth, and throat: MM dry,  Cardiovascular: RRR, ext warm, no murmur Respiratory: diminished with rhonci bilaterally, no wheezes Gastrointestinal: soft, hypoactive BS Skin: No rashes, normal turgor Neurologic: Awake, following commands, moving all 4 extremities   Labs/imaging that I havepersonally reviewed  (right click and "Reselect all SmartList Selections" daily)  04/7: Respiratory Panel by RT-PCR negative, however CXR concerning for RLL pneumonia and probable small effusion 04/7: CTA Chest negative for PE, however concerning for pneumonia 04/7: Azithromycin 04/7>> 04/7: Ceftriaxone 04/7>>  Resolved Hospital Problem list   N/A  Assessment & Plan:   Acute on chronic hypoxic respiratory failure secondary to persistent RLL pneumonia acute metabolic encephalopathy due to Anxiety/ ICU delirium FTT, anorexia x 5 months since admission for ACS  Sedation was stopped this morning Patient tolerated spontaneous breathing trial this morning, successfully extubated Currently requiring 4 L oxygen via nasal cannula, patient's wife is asking for prescription of oxygen therapy at home Antibiotics switched to Unasyn considering right lower lobe infiltrates per possible aspiration pneumonia Currently he is requiring 4 L  oxygen and barely able to maintain 88% O2 sat Upon discharge he will require oxygen based on meeting criteria for COPD Mental status has improved Avoid sedation Continue tube feed for now, encourage oral intake Speech and swallow  evaluation is requested Follow-up bronc culture   Best practice (right click and "Reselect all SmartList Selections" daily)  Diet:  Oral/tube feed Pain/Anxiety/Delirium protocol (if indicated): No VAP protocol (if indicated): Not indicated DVT prophylaxis: LMWH GI prophylaxis: N/A Glucose control:  SSI No Central venous access:  N/A Arterial line:  N/A Foley:  N/A Mobility:  bed rest  PT consulted: N/A Patient's family and patient were updated at bedside by MD 06/23/2020 Code Status:  full code Disposition: ICU   Total critical care time: 37 minutes  Performed by: [place] care time was exclusive of separately billable procedures and treating other patients.   Critical care was necessary to treat or prevent imminent or life-threatening deterioration.   Critical care was time spent personally by me on the following activities: development of treatment plan with patient and/or surrogate as well as nursing, discussions with consultants, evaluation of patient's response to treatment, examination of patient, obtaining history from patient or surrogate, ordering and performing treatments and interventions, ordering and review of laboratory studies, ordering and review of radiographic studies, pulse oximetry and re-evaluation of patient's condition.   Jacky Kindle MD [place] [facility] See Amion for pager If no response to pager, please call [phone] until 7pm After 7pm, Please call E-link [phone]

## 2020-06-23 NOTE — Progress Notes (Signed)
eLink Physician-Brief Progress Note Patient Name: KERBY BORNER DOB: Nov 26, 1950 MRN: [id number]   Date of Service  06/23/2020  HPI/Events of Note  Agitation - Nursing request for one time dose of Versed.   eICU Interventions  Plan: 1. Versed 1 mg IV X 1 now.      Intervention Category Major Interventions: Delirium, psychosis, severe agitation - evaluation and management  Cortne Amara Eugene 06/23/2020, 1:06 AM

## 2020-06-23 NOTE — Progress Notes (Signed)
Report given to Jaime RN.

## 2020-06-23 NOTE — Progress Notes (Signed)
Pt bladder scanned @ 2200, showed > 550 ml urine retention. E-link contacted to receive I/O cath order. Following placement of order, I/O cath completed @ 0000. 500 ml amber urine drained. Following I/O cath, pt increasingly restless and anxious. 1 time order of 1 mg Versed given. Prop @ 50 and Fent @ 100.   Pt remains on Tube Feeds overnight. Pt in no acute distress @ this time. Will continue to monitor.

## 2020-06-23 NOTE — Progress Notes (Signed)
Neuro: intermittent confusion, patient becoming more difficult to arouse throughout the evening Resp: increased work of breathing, difficulty clearing secretions, MD aware CV: afebrile, vital signs fluctuating GIGU: tube feeds via NG, no BM, difficulty urinating Skin: buttock reddened Social: Wife visiting at the bedside during the day. Spoke significantly regarding patient's status, explained the severity of the patient's status and clarified misunderstandings she had during her conversation with the doctor. She expressed confusion with the doctor stating this morning that he was doing significantly better now he is really struggling to breath. It was explained that the patient's COPD has advanced and this is the course of the disease. The wife notified her 2 sons they needed to come to the hospital this evening to have a family meeting with Dr. Tamala Julian. Following the meeting, Dr. Tamala Julian notified day/night shift nurses the patient will be moving to comfort care.  Events: Took over care from [employer] midday.

## 2020-06-23 NOTE — Progress Notes (Signed)
eLink Physician-Brief Progress Note Patient Name: Benjamin Robertson DOB: October 12, 1950 MRN: [id number]   Date of Service  06/23/2020  HPI/Events of Note  Patient is now comfort care. Discussion well documented by Dr. Tamala Julian. Nursing request to transfer patient to a palliative care bed in an effort to free up an ICU bed.   eICU Interventions  Plan: 1. Transfer patient to palliative care bed. 2. D/C cardiac monitoring.      Intervention Category Major Interventions: Other:  Lysle Dingwall 06/23/2020, 8:23 PM

## 2020-06-23 NOTE — Progress Notes (Signed)
Nutrition Follow-up  DOCUMENTATION CODES:   Non-severe (moderate) malnutrition in context of chronic illness  INTERVENTION:   -D/c Vital AF 1.2 -Initiate Osmolite 1.5 @ 60 ml/hr via NGT  45 ml Prosource TF TID  160 ml free water flush every 4 hours  Tube feeding regimen provides 2280 kcal (100% of needs), 123 grams of protein, and 1097 ml of H2O. Total free water: 2057 ml daily  NUTRITION DIAGNOSIS:   Moderate Malnutrition related to chronic illness (COPD) as evidenced by mild fat depletion,moderate fat depletion,severe muscle depletion,percent weight loss.  Ongoing  GOAL:   Patient will meet greater than or equal to 90% of their needs  Met with TF  MONITOR:   PO intake,Supplement acceptance,Labs,Weight trends,Skin,I & O's  REASON FOR ASSESSMENT:   Malnutrition Screening Tool    ASSESSMENT:   70 y.o. male with medical history significant for COPD stage I/II, history of tobacco use, CAD status post CABG x3 after PCI x2 to the RCA (lima-lad, svg-diagonal svg-ri), grafts to the ramus intermedius, and LAD-diagonal as well as stents in the native RCA and left main circumflex, hypertension, depression/anxiety, presents to the emergency department for chief concerns of shortness of breath.  4/12- intubated, TF initiated 4/13- extubated  Reviewed I/O's: +85 ml x 24 hours and -5.4 L since admission  UOP: 500 ml x 24 hours  Case discussed with RN, MD, and in ICU rounds. Pt was extubated this morning. Plan to resume TF via NGT. Swallow evaluation pending.   Medications reviewed and include magnesium oxide, solu-medrol, miralax, and senokot.   Labs reviewed: CBGS: 107-172.   Diet Order:   Diet Order    None      EDUCATION NEEDS:   Education needs have been addressed  Skin:  Skin Assessment: Reviewed RN Assessment  Last BM:  06/22/20  Height:   Ht Readings from Last 1 Encounters:  06/18/20 '5\' 8"'  (1.727 m)    Weight:   Wt Readings from Last 1 Encounters:   06/21/20 75.5 kg    Ideal Body Weight:  70 kg  BMI:  Body mass index is 25.31 kg/m.  Estimated Nutritional Needs:   Kcal:  4782-9562  Protein:  115-130 grams  Fluid:  > 2 L    Loistine Chance, RD, LDN, [place] Registered Dietitian II Certified Diabetes Care and Education Specialist Please refer to [facility] for RD and/or RD on-call/weekend/after hours pager

## 2020-06-23 NOTE — Progress Notes (Signed)
Family Meeting Note  Following up Dr. Jeanella Craze discussion with family this AM. Patient feeling no better after bronchoscopy, still dyspneic with sensation of being unable to clear secretions.  Discussed what long term options would look like: aggressive would be likely PEG with SNF vs. Rehab and potential to get some level of function back (but no guarantee) should his nutritional status improve.  Alternative would be using medicine to ease breathing and allowing a natural death.  Patient states he has not felt well since his hospitalization in September 2021 and is tired of fighting this.  Has gone from 88.5kg to 75kg unintentionally, unable to eat due to fatigue and breathing difficulty.  He greatly valued his independence and does not want to lose this for an extended period of time.  He would like to go on his own terms, comfortable and with family.  Wife and kids at bedside agree.  Comfort care orders placed, should he look okay in AM would transition to home hospice.  RN and Dr. Tacy Learn updated.  Erskine Emery MD PCCM

## 2020-06-23 NOTE — Progress Notes (Signed)
PT Cancellation Note  Patient Details Name: Benjamin Robertson MRN: [id number] DOB: 1950-09-13   Cancelled Treatment:    Reason Eval/Treat Not Completed: Patient not medically ready Pt was extubated this AM, spoke with nurse who reports that he is not yet ready for activity with PT.  Indicating that his O2 saturations drop precipitously with even a modicum of activity.  Will maintain on caseload and attempt to see when medically appropriate to participate with PT/activity.   Kreg Shropshire, DPT 06/23/2020, 3:25 PM

## 2020-06-23 NOTE — Progress Notes (Addendum)
SLP Cancellation Note  Patient Details Name: Benjamin Robertson MRN: [id number] DOB: Sep 10, 1950   Cancelled treatment:       Reason Eval/Treat Not Completed: Medical issues which prohibited therapy;Patient not medically ready (chart reviewed). Per chart notes, pt was extubated this morning ~9:30am s/p oral intubation yesterday for Bronchoscopy d/t  persistent RLL collapse. Pt was placed on 4 liters. Oxygenating 88-90%. Recommend frequent oral care for hygiene and stimulatin of swallowing today. General aspiration precautions.  ST services will f/u tomorrow for BSE.      Orinda Kenner, MS, CCC-SLP Speech Language Pathologist Rehab Services [phone] [facility] 06/23/2020, 2:31 PM

## 2020-06-23 NOTE — Progress Notes (Signed)
Pt. Extubated and placed on 4lnc. No apparent distress noted at this time.

## 2020-06-23 NOTE — Progress Notes (Signed)
Pt transitioned to comfort care @ outset of shift. NG tube removed. Morphine gtt started, titrated up to 10. 2x Morphine boluses given for increased WOB. Pt txd to floor. O2 increased from 10 to 15L HFNC for trip to floor. Wife @ bedside.

## 2020-06-24 ENCOUNTER — Encounter: Payer: Self-pay | Admitting: Internal Medicine

## 2020-06-24 DIAGNOSIS — Z515 Encounter for palliative care: Secondary | ICD-10-CM

## 2020-06-24 DIAGNOSIS — J9621 Acute and chronic respiratory failure with hypoxia: Secondary | ICD-10-CM

## 2020-06-24 DIAGNOSIS — J189 Pneumonia, unspecified organism: Secondary | ICD-10-CM | POA: Diagnosis not present

## 2020-06-24 LAB — CULTURE, RESPIRATORY W GRAM STAIN: Culture: NORMAL

## 2020-06-24 LAB — GLUCOSE, CAPILLARY: Glucose-Capillary: 207 mg/dL — ABNORMAL HIGH (ref 70–99)

## 2020-06-24 MED ORDER — HALOPERIDOL LACTATE 2 MG/ML PO CONC
0.5000 mg | ORAL | Status: DC | PRN
  Filled 2020-06-24: qty 0.3

## 2020-06-24 MED ORDER — HALOPERIDOL LACTATE 5 MG/ML IJ SOLN: 0.5000 mg | INTRAMUSCULAR | Status: DC | PRN

## 2020-06-24 MED ORDER — HALOPERIDOL 0.5 MG PO TABS
0.5000 mg | ORAL_TABLET | ORAL | Status: DC | PRN
  Filled 2020-06-24: qty 1

## 2020-06-24 MED ORDER — BIOTENE DRY MOUTH MT LIQD: 15.0000 mL | OROMUCOSAL | Status: DC | PRN

## 2020-06-29 LAB — BLOOD GAS, VENOUS
Acid-Base Excess: 4.8 mmol/L — ABNORMAL HIGH (ref 0.0–2.0)
Bicarbonate: 33.7 mmol/L — ABNORMAL HIGH (ref 20.0–28.0)
O2 Saturation: 25.2 %
Patient temperature: 37
pCO2, Ven: 64 mmHg — ABNORMAL HIGH (ref 44.0–60.0)
pH, Ven: 7.33 (ref 7.250–7.430)
pO2, Ven: <31 mmHg — CL (ref 32.0–45.0)

## 2020-07-06 ENCOUNTER — Ambulatory Visit: Payer: Medicare Other | Admitting: Internal Medicine

## 2020-07-11 NOTE — Progress Notes (Signed)
NAME:  Benjamin Robertson, MRN:  [id number], DOB:  02-Sep-1950, LOS: 6 ADMISSION DATE:  06/13/2020, CONSULTATION DATE: 06/18/2020 REFERRING MD: Sharion Settler, NP, CHIEF COMPLAINT: Shortness of Breath  History of Present Illness:  This is a 70 yo male who presented to [facility] ER on 04/7 with a 2 day hx of shortness of breath and chest congestion.  He also endorsed a nonproductive cough.  Upon arrival to the ER Respiratory Panel by RT-PCR negative, however CXR concerning for RLL pneumonia and probable small effusion.  CTA Chest negative for PE, however concerning for pneumonia.  He received ceftriaxone and azithromycin.  He was subsequently admitted to the [facility] unit for additional workup and treatment per hospitalist team.  However, on 04/08 pt required transfer to the stepdown unit for Bipap and PCCM team consulted to assist with management.    Pertinent  Medical History  Pneumonia  Obesity  Kidney Stones Ischemic Cardiomyopathy  HLD HTN Hemorrhoids  Childhood Asthma CAD s/p CABG x3 Arthritis   Significant Hospital Events: Including procedures, antibiotic start and stop dates in addition to other pertinent events   . Pt admitted to the medsurg unit with pneumonia 04/7 . Pt transferred to the stepdown unit 04/8 with worsening respiratory failure requiring Bipap  . 06/19/2020 very tenacious secretions, persistent collapse right lower lobe initiated MetaNeb . 4/10 >> ongoing pulmonary toileting efforts . 4/13 Extubated . 4/13: Transitioned to [place] and transferred to floor . 4/14: Comfortable on morphine gtt.  Consult Palliative Care to evaluate for Hospice at home  Interim History / Subjective:  -Pt extubated yesterday; post extubation with continued Dyspnea and trouble with clearing secretions -Given poor long term prognosis and preference to not lose his independence by possibly needing SNF vs Rehab, Pt and Family together elected to transition to [place] -This morning pt is  resting comfortably on Morphine gtt @ 10 mg/hr -Pt's son and daughter in law at bedside (pt's wife currently not at hospital), was mention yesterday of possibly going home with Hospice ~Will consult Palliative Care to assist with Disposition  Objective   Blood pressure (!) 82/47, pulse 83, temperature 98 F (36.7 C), temperature source Oral, resp. rate 19, height 5\' 8"  (1.727 m), weight 75.5 kg, SpO2 (!) 83 %.        Intake/Output Summary (Last 24 hours) at Jul 18, 2020 1053 Last data filed at 18-Jul-2020 0600 Gross per 24 hour  Intake 577.89 ml  Output 875 ml  Net -297.11 ml   Filed Weights   07/06/2020 1059 06/18/20 0525 06/21/20 0000  Weight: 74.8 kg 76.6 kg 75.5 kg    Examination: Constitutional: Acute on chronically ill appearing male, laying in bed, on morphine gtt, in NAD Eyes: Pupils PERRL Ears, nose, mouth, and throat: MM dry, no thyromegaly, trachea midline Cardiovascular: Regular rate and rhythm, s1s2, no M/R/G Respiratory: Diminished breath sounds bilaterally Gastrointestinal: Soft, nontender, nondistended, no guarding or rebound tenderness, BS+ Skin: Warm and dry.  No obvious rashes, lesions, or ulcerations Neurologic: Sedated on morphine gtt, resting comfortably   Labs/imaging that I havepersonally reviewed  (right click and "Reselect all SmartList Selections" daily)  04/7: Respiratory Panel by RT-PCR negative, however CXR concerning for RLL pneumonia and probable small effusion 04/7: CTA Chest negative for PE, however concerning for pneumonia 04/7: Azithromycin 04/7>> 04/7: Ceftriaxone 04/7>>  Resolved Hospital Problem list   N/A  Assessment & Plan:   Acute on chronic hypoxic respiratory failure secondary to persistent RLL pneumonia acute metabolic encephalopathy due to Anxiety/  ICU delirium FTT, anorexia x 5 months since admission for ACS -Pt extubated yesterday; post extubation with continued Dyspnea and difficulty clearing secretions -Given poor long term  prognosis and preference to not lose his independence by possibly needing SNF vs Rehab, Pt and Family together elected to transition to [place] -Supplemental O2 as needed for comfort -Continue Morphine gtt as needed -Prn Ativan for anxiety -Prn Glycopyrrolate for secretions -Will consult Palliative Care for assistance with Disposition: Home with hospice vs. Hospice home vs. Hospital Death     Best practice (right click and "Reselect all SmartList Selections" daily)  Diet:  Oral Pain/Anxiety/Delirium protocol (if indicated): yes, morphine gtt VAP protocol (if indicated): Not indicated DVT prophylaxis: LMWH GI prophylaxis: N/A Glucose control:  SSI No Central venous access:  N/A Arterial line:  N/A Foley:  N/A Mobility:  bed rest  PT consulted: N/A Patient's family and patient were updated at bedside by NP 07/22/2020 Code Status:  full code Disposition: Palliative Care   [name], AGACNP-BC [place] epic messenger for cross cover needs If after hours, please call E-link

## 2020-07-11 NOTE — Progress Notes (Signed)
Skin swarm completed, blanchable redness to sacrum. Upper body with a ruddy discoloration, pt visibly having increased WOB - shallow, bradypneic respirations, use of accessory muscles and pt groaning in pain. Morphine gtt titrated to 10mL/hr and ativan given. Respiratory paged to bedside to set up HFNC and assess pt. Charge nurse at bedside to assist with getting pt settled. Wife updated and needs addressed.

## 2020-07-11 NOTE — Care Management Important Message (Signed)
Important Message  Patient Details  Name: Benjamin Robertson MRN: [id number] Date of Birth: Mar 02, 1951   Medicare Important Message Given:  Other (see comment)  Patient is on comfort care and out of respect for the patient and family no Important Message from [facility] given.  [name] 15-Jul-2020, 8:34 AM

## 2020-07-11 NOTE — Death Summary Note (Signed)
DEATH SUMMARY   Patient Details  Name: Benjamin Robertson MRN: [id number] DOB: Dec 06, 1950  Admission/Discharge Information   Admit Date:  2020/07/15  Date of Death: Date of Death: 07-22-2020  Time of Death: Time of Death: 06-28-02  Length of Stay: 6  Referring Physician: Venia Carbon, MD   Reason(s) for Hospitalization  Acute on chronic hypoxic respiratory failure Community-acquired pneumonia COPD exacerbation Mucous plugging of bronchi Acute metabolic encephalopathy Failure to thrive  Diagnoses  Preliminary cause of death:   Acute on chronic hypoxic respiratory failure Secondary Diagnoses (including complications and co-morbidities):  Principal Problem:   CAP (community acquired pneumonia) Active Problems:   Cigarette smoker one half pack a day or less   Essential hypertension   Chronic dyspnea   COPD (chronic obstructive pulmonary disease) (HCC)   Statin myopathy   Malnutrition of moderate degree   Acute respiratory failure with hypoxia (HCC)   Mucus plugging of bronchi   Brief Hospital Course (including significant findings, care, treatment, and services provided and events leading to death)  SYLVESTER MINTON is a 70 yo male who presented to [facility] ER on 07/16/22 with a 2 day hx of shortness of breath and chest congestion.  He also endorsed a nonproductive cough.  ED course  Upon arrival to the ER Respiratory Panel by RT-PCR negative, however CXR concerning for RLL pneumonia and probable small effusion.  CTA Chest negative for PE, however concerning for pneumonia.  He received ceftriaxone and azithromycin.  He was subsequently admitted to the [facility] unit for additional workup and treatment per hospitalist team.    However, on 04/8 pt required transfer to the stepdown unit for Bipap and PCCM team consulted to assist with management.    On 06/19/2020 he was noted to have very tenacious secretions with persistent collapse of the right lower lobe for which he was placed on MetaNebs.  Given  persistent right lower lobe endobronchial obstruction, he underwent intubation and bronchoscopy on 06/22/2020.  He was extubated on 06/23/2020, but continued to complain of dyspnea and difficulty clearing secretions.  His long-term prognosis was discussed by the ICU attending including, aggressive care likely necessitating PEG with SNF or rehab versus using medicines to ease breathing allowing for natural death.  After discussion with the patient and his family, they came to decision to transition to comfort care measures.  He was placed on morphine drip and transitioned out of the ICU.  On 07/22/20 he remained on morphine drip and is actively dying.  Palliative care was consulted for assistance with disposition.  The patient expired shortly thereafter.    Pertinent Labs and Studies  Significant Diagnostic Studies DG Chest 2 View  Result Date: 15-Jul-2020 CLINICAL DATA:  Pt reports 2 days of worsening sob. Hx of COPD. Denies fever.Pt says he has phlegm down in his throat and air way that he cannot clear out. Pt has a weak cough and denies cp. Pts wife said he had a nebulizer treatment at home yesterday but it gave no relief to his SOB.Hx. COPD, heart stents EXAM: CHEST - 2 VIEW COMPARISON:  11/14/2019.  Chest CT, 03/02/2020. FINDINGS: Stable changes from prior CABG surgery. Cardiac silhouette is normal in size. No mediastinal or hilar masses. No evidence of adenopathy. Opacity at the right lung base partly silhouettes the hemidiaphragm. This is consistent with atelectasis or pneumonia. Lateral view suggests a small effusion on the right. Linear opacity noted at the posterior base of the left lower lobe consistent with atelectasis. Remainder of  the lungs is clear. No left pleural effusion. No pneumothorax. Skeletal structures are demineralized but grossly intact. IMPRESSION: 1. Right lung base opacity consistent with either atelectasis or pneumonia combination with a probable small effusion. 2. No other  evidence of acute cardiopulmonary disease. No pulmonary edema. 3. Stable changes from previous CABG surgery. Electronically Signed   By: Lajean Manes M.D.   On: 06/26/2020 12:11   CT CHEST WO CONTRAST  Result Date: 06/21/2020 CLINICAL DATA:  Shortness of breath, cough, chest congestion, right lower lobe collapse EXAM: CT CHEST WITHOUT CONTRAST TECHNIQUE: Multidetector CT imaging of the chest was performed following the standard protocol without IV contrast. COMPARISON:  CT chest angiogram, 07/07/2020 FINDINGS: Cardiovascular: Aortic atherosclerosis. Normal heart size. Extensive 3 vessel coronary artery calcifications and/or stents. No pericardial effusion. Mediastinum/Nodes: No enlarged mediastinal, hilar, or axillary lymph nodes. Thyroid gland, trachea, and esophagus demonstrate no significant findings. Lungs/Pleura: Minimal centrilobular and paraseptal emphysema. Background of very fine centrilobular nodularity, most concentrated in the lung apices. There is proximal occlusion of the proximal right lower lobe bronchus by a rounded filling defect with associated complete atelectasis of the right lower lobe, unchanged compared to prior examination (Series 3, image 71, series 5, image 89). There is mild scarring and or atelectasis of the left lung base. No pleural effusion or pneumothorax. Upper Abdomen: No acute abnormality. Definitively benign fat containing right adrenal adenoma. Musculoskeletal: No chest wall mass or suspicious bone lesions identified. IMPRESSION: 1. There is proximal occlusion of the proximal right lower lobe bronchus by a rounded filling defect with associated complete atelectasis of the right lower lobe, unchanged compared to prior examination. Findings are concerning for endobronchial mass. Consider bronchoscopy to further evaluate. 2. Minimal emphysema. 3. Background of very fine centrilobular nodularity, most concentrated in the lung apices, most commonly seen in smoking-related  respiratory bronchiolitis. 4. Coronary artery disease. Aortic Atherosclerosis (ICD10-I70.0) and Emphysema (ICD10-J43.9). Electronically Signed   By: Eddie Candle M.D.   On: 06/21/2020 18:19   CT ANGIO CHEST PE W OR WO CONTRAST  Result Date: 06/13/2020 CLINICAL DATA:  Shortness of breath EXAM: CT ANGIOGRAPHY CHEST WITH CONTRAST TECHNIQUE: Multidetector CT imaging of the chest was performed using the standard protocol during bolus administration of intravenous contrast. Multiplanar CT image reconstructions and MIPs were obtained to evaluate the vascular anatomy. CONTRAST:  1mL OMNIPAQUE IOHEXOL 350 MG/ML SOLN COMPARISON:  Chest radiograph June 17, 2020; chest CT March 02, 2020 FINDINGS: Cardiovascular: There is no demonstrable pulmonary embolus. There is no appreciable thoracic aortic aneurysm or dissection. There are multiple foci of calcification in visualized great vessels. There are foci of aortic atherosclerosis. There are foci of native coronary artery calcification. Patient is status post coronary artery bypass grafting. There is no pericardial effusion or pericardial thickening. Mediastinum/Nodes: Thyroid appears unremarkable. No evident thoracic adenopathy by size criteria. Several subcentimeter lymph nodes are present in the mediastinum which do not meet size criteria for pathologic significance. No esophageal lesions are evident. Lungs/Pleura: There is consolidation in the right lower lobe with volume loss. There is a small focus of airspace opacity in the posterior left base. Mild underlying centrilobular emphysematous changes stable. No appreciable pleural effusion. Trachea and major bronchial structures appear patent. No pneumothorax. Upper Abdomen: There is upper abdominal aortic atherosclerosis. There is incomplete visualization of a right adrenal adenoma measuring 3.1 x 2.6 cm, present on prior study. Visualized upper abdominal structures otherwise appear unremarkable. Musculoskeletal: Foci of  degenerative change noted in the thoracic spine. Patient is status  post median sternotomy. No blastic or lytic bone lesions. No evident chest wall lesions. Review of the MIP images confirms the above findings. IMPRESSION: 1. No demonstrable pulmonary embolus. No thoracic aortic aneurysm or dissection. There is aortic atherosclerosis as well as foci of great vessel and native coronary artery calcification. Patient is status post coronary artery bypass grafting. 2. Consolidation with volume loss throughout the right lower lobe consistent with pneumonia. Small focus of pneumonia posterior left base. Mild underlying centrilobular emphysema. 3.  No evident adenopathy by size criteria. 4. Benign right adrenal adenoma, incompletely visualized but appearing stable compared to prior CT examination. Aortic Atherosclerosis (ICD10-I70.0) and Emphysema (ICD10-J43.9). Electronically Signed   By: Lowella Grip III M.D.   On: 06/23/2020 14:51   DG Chest Port 1 View  Result Date: 06/22/2020 CLINICAL DATA:  Central line placement, Dobbhoff placement EXAM: PORTABLE CHEST 1 VIEW COMPARISON:  Portable exam 1050 hours compared to 06/19/2020 FINDINGS: Tip of endotracheal tube projects 5.3 cm above carina. Tip of feeding tube projects over gastric fundus. RIGHT jugular central venous catheter with tip projecting over SVC. Normal heart size post CABG. Atherosclerotic calcification aorta. Bibasilar atelectasis and probable small subpulmonic effusions. No acute infiltrate or pneumothorax. Osseous structures unremarkable. IMPRESSION: No pneumothorax following central line placement. Bibasilar atelectasis and probable small subpulmonic effusions. Electronically Signed   By: Lavonia Dana M.D.   On: 06/22/2020 11:25   DG Chest Port 1 View  Result Date: 06/19/2020 CLINICAL DATA:  Shortness of breath EXAM: PORTABLE CHEST 1 VIEW COMPARISON:  June 17, 2020 chest radiograph and chest CT FINDINGS: There is persistent right lower lobe region  consolidation. Lungs elsewhere are clear. Heart is upper normal in size with pulmonary vascularity normal. Status post coronary artery bypass grafting. No adenopathy. No bone lesions. IMPRESSION: Persistent airspace opacity consistent with pneumonia right lower lobe region. No new opacity evident. Stable cardiac silhouette. Status post coronary artery bypass grafting. Electronically Signed   By: Lowella Grip III M.D.   On: 06/19/2020 10:43   ECHOCARDIOGRAM COMPLETE  Result Date: 06/09/2020    ECHOCARDIOGRAM REPORT   Patient Name:   TIMM BONENBERGER Parkin  Date of Exam: 06/09/2020 Medical Rec #:  [id number]     Height:       68.0 in Accession #:    [id number]    Weight:       167.2 lb Date of Birth:  Apr 06, 1950      BSA:          1.894 m Patient Age:    70 years      BP:           130/77 mmHg Patient Gender: M             HR:           79 bpm. Exam Location:  [address] Procedure: 2D Echo, Color Doppler and Cardiac Doppler Indications:    I25.10 CAD  History:        Patient has prior history of Echocardiogram examinations, most                 recent 11/15/2019. Cardiomyopathy, CAD and Previous Myocardial                 Infarction, Prior CABG, COPD; Risk Factors:Hypertension,                 Dyslipidemia and Current Smoker.  Sonographer:    Coralyn Helling RDCS Referring Phys: [place]  1. Mild hypokinesis of the basal to mid inferior myocardium. Left ventricular ejection fraction, by estimation, is 55 to 60%. The left ventricle has normal function. The left ventricle demonstrates regional wall motion abnormalities (see scoring diagram/findings for description). Left ventricular diastolic parameters are consistent with Grade I diastolic dysfunction (impaired relaxation).  2. Right ventricular systolic function is normal. The right ventricular size is normal. There is normal pulmonary artery systolic pressure.  3. The mitral valve is normal in structure. No evidence of mitral valve regurgitation.  No evidence of mitral stenosis.  4. The aortic valve is tricuspid. Aortic valve regurgitation is not visualized. No aortic stenosis is present.  5. The inferior vena cava is normal in size with greater than 50% respiratory variability, suggesting right atrial pressure of 3 mmHg. FINDINGS  Left Ventricle: Mild hypokinesis of the basal to mid inferior myocardium. Left ventricular ejection fraction, by estimation, is 55 to 60%. The left ventricle has normal function. The left ventricle demonstrates regional wall motion abnormalities. The left ventricular internal cavity size was normal in size. There is no left ventricular hypertrophy. Left ventricular diastolic parameters are consistent with Grade I diastolic dysfunction (impaired relaxation). Normal left ventricular filling pressure. Right Ventricle: The right ventricular size is normal. No increase in right ventricular wall thickness. Right ventricular systolic function is normal. There is normal pulmonary artery systolic pressure. The tricuspid regurgitant velocity is 2.64 m/s, and  with an assumed right atrial pressure of 3 mmHg, the estimated right ventricular systolic pressure is 93.2 mmHg. Left Atrium: Left atrial size was normal in size. Right Atrium: Right atrial size was normal in size. Pericardium: There is no evidence of pericardial effusion. Mitral Valve: The mitral valve is normal in structure. No evidence of mitral valve regurgitation. No evidence of mitral valve stenosis. Tricuspid Valve: The tricuspid valve is normal in structure. Tricuspid valve regurgitation is trivial. No evidence of tricuspid stenosis. Aortic Valve: The aortic valve is tricuspid. Aortic valve regurgitation is not visualized. No aortic stenosis is present. Pulmonic Valve: The pulmonic valve was normal in structure. Pulmonic valve regurgitation is trivial. No evidence of pulmonic stenosis. Aorta: The aortic root is normal in size and structure. Venous: The inferior vena cava is  normal in size with greater than 50% respiratory variability, suggesting right atrial pressure of 3 mmHg. IAS/Shunts: No atrial level shunt detected by color flow Doppler.  LEFT VENTRICLE PLAX 2D LVIDd:         5.10 cm  Diastology LVIDs:         3.30 cm  LV e' medial:    6.74 cm/s LV PW:         1.00 cm  LV E/e' medial:  7.7 LV IVS:        1.00 cm  LV e' lateral:   10.80 cm/s LVOT diam:     2.40 cm  LV E/e' lateral: 4.8 LV SV:         65 LV SV Index:   34 LVOT Area:     4.52 cm  RIGHT VENTRICLE            IVC RV S prime:     9.25 cm/s  IVC diam: 1.00 cm TAPSE (M-mode): 1.1 cm RVSP:           30.9 mmHg LEFT ATRIUM             Index       RIGHT ATRIUM           Index  LA diam:        4.20 cm 2.22 cm/m  RA Pressure: 3.00 mmHg LA Vol (A2C):   53.6 ml 28.30 ml/m RA Area:     14.70 cm LA Vol (A4C):   44.5 ml 23.50 ml/m RA Volume:   37.10 ml  19.59 ml/m LA Biplane Vol: 49.3 ml 26.03 ml/m  AORTIC VALVE LVOT Vmax:   78.30 cm/s LVOT Vmean:  54.000 cm/s LVOT VTI:    0.143 m  AORTA Ao Root diam: 3.50 cm Ao Asc diam:  3.00 cm MV E velocity: 52.10 cm/s  TRICUSPID VALVE MV A velocity: 66.30 cm/s  TR Peak grad:   27.9 mmHg MV E/A ratio:  0.79        TR Vmax:        264.00 cm/s                            Estimated RAP:  3.00 mmHg                            RVSP:           30.9 mmHg                             SHUNTS                            Systemic VTI:  0.14 m                            Systemic Diam: 2.40 cm Skeet Latch MD Electronically signed by Skeet Latch MD Signature Date/Time: 06/09/2020/6:48:00 PM    Final     Microbiology Recent Results (from the past 240 hour(s))  Resp Panel by RT-PCR (Flu A&B, Covid) Nasopharyngeal Swab     Status: None   Collection Time: 07/03/2020 11:57 AM   Specimen: Nasopharyngeal Swab; Nasopharyngeal(NP) swabs in vial transport medium  Result Value Ref Range Status   SARS Coronavirus 2 by RT PCR NEGATIVE NEGATIVE Final    Comment: (NOTE) SARS-CoV-2 target nucleic acids are  NOT DETECTED.  The SARS-CoV-2 RNA is generally detectable in upper respiratory specimens during the acute phase of infection. The lowest concentration of SARS-CoV-2 viral copies this assay can detect is 138 copies/mL. A negative result does not preclude SARS-Cov-2 infection and should not be used as the sole basis for treatment or other patient management decisions. A negative result may occur with  improper specimen collection/handling, submission of specimen other than nasopharyngeal swab, presence of viral mutation(s) within the areas targeted by this assay, and inadequate number of viral copies(<138 copies/mL). A negative result must be combined with clinical observations, patient history, and epidemiological information. The expected result is Negative.  Fact Sheet for Patients:  EntrepreneurPulse.com.au  Fact Sheet for Healthcare Providers:  IncredibleEmployment.be  This test is no t yet approved or cleared by the [place] FDA and  has been authorized for detection and/or diagnosis of SARS-CoV-2 by FDA under an Emergency Use Authorization (EUA). This EUA will remain  in effect (meaning this test can be used) for the duration of the COVID-19 declaration under Section 564(b)(1) of the Act, 21 U.S.C.section 360bbb-3(b)(1), unless the authorization is terminated  or revoked sooner.       Influenza A by  PCR NEGATIVE NEGATIVE Final   Influenza B by PCR NEGATIVE NEGATIVE Final    Comment: (NOTE) The Xpert Xpress SARS-CoV-2/FLU/RSV plus assay is intended as an aid in the diagnosis of influenza from Nasopharyngeal swab specimens and should not be used as a sole basis for treatment. Nasal washings and aspirates are unacceptable for Xpert Xpress SARS-CoV-2/FLU/RSV testing.  Fact Sheet for Patients: EntrepreneurPulse.com.au  Fact Sheet for Healthcare Providers: IncredibleEmployment.be  This test is not  yet approved or cleared by the [place] FDA and has been authorized for detection and/or diagnosis of SARS-CoV-2 by FDA under an Emergency Use Authorization (EUA). This EUA will remain in effect (meaning this test can be used) for the duration of the COVID-19 declaration under Section 564(b)(1) of the Act, 21 U.S.C. section 360bbb-3(b)(1), unless the authorization is terminated or revoked.  Performed at [facility], [address] [place], [place] [address]   MRSA PCR Screening     Status: None   Collection Time: 06/18/20  5:59 AM   Specimen: Nasal Mucosa; Nasopharyngeal  Result Value Ref Range Status   MRSA by PCR NEGATIVE NEGATIVE Final    Comment:        The GeneXpert MRSA Assay (FDA approved for NASAL specimens only), is one component of a comprehensive MRSA colonization surveillance program. It is not intended to diagnose MRSA infection nor to guide or monitor treatment for MRSA infections. Performed at [facility], [address] [place], [place] [address]   Culture, respiratory     Status: None   Collection Time: 06/22/20  9:55 AM   Specimen: Bronchial Wash; Respiratory  Result Value Ref Range Status   Specimen Description   Final    BRONCHIAL WASHINGS Performed at [facility], [address] [place], [place] [address]    Special Requests   Final    NONE Performed at [facility], [place], [place] [address]    Gram Stain   Final    RARE WBC PRESENT,BOTH PMN AND MONONUCLEAR NO ORGANISMS SEEN    Culture   Final    RARE Normal respiratory flora-no Staph aureus or Pseudomonas seen Performed at [facility] [address] [place], [place] [address]    Report Status Jul 04, 2020 FINAL  Final    Lab Basic Metabolic Panel: Recent Labs  Lab 06/19/20 0419 06/20/20 0527 06/21/20 0419 06/22/20 0405 06/22/20 0809 06/22/20 1806 06/23/20 0500  NA 135 133* 135 136  --   --  136  K 4.7 4.6  4.4 4.1  --   --  4.8  CL 99 99 97* 96*  --   --  98  CO2 26 28 30  32  --   --  31  GLUCOSE 128* 127* 116* 116*  --   --  151*  BUN 21 22 22 22   --   --  32*  CREATININE 0.63 0.65 0.77 0.75  --   --  0.72  CALCIUM 9.3 9.1 9.2 9.0  --   --  9.1  MG 2.0 2.1 2.2 2.2 2.4 2.2 2.3  PHOS 4.4 4.2 4.4 3.8 3.7 3.8 4.3   Liver Function Tests: No results for input(s): AST, ALT, ALKPHOS, BILITOT, PROT, ALBUMIN in the last 168 hours. No results for input(s): LIPASE, AMYLASE in the last 168 hours. No results for input(s): AMMONIA in the last 168 hours. CBC: Recent Labs  Lab 06/19/20 0419 06/20/20 0527 06/21/20 0419 06/22/20 0405 06/23/20 0500  WBC 9.9  8.1 6.2 8.9 8.2  HGB 15.8 15.8 16.5 17.2* 15.2  HCT 46.1 45.3 48.0 50.0 45.2  MCV 93.9 93.4 93.2 93.8 95.2  PLT 192 205 220 257 227   Cardiac Enzymes: No results for input(s): CKTOTAL, CKMB, CKMBINDEX, TROPONINI in the last 168 hours. Sepsis Labs: Recent Labs  Lab 06/18/20 0456 06/18/20 0533 06/18/20 0700 06/19/20 0419 06/20/20 0527 06/21/20 0419 06/22/20 0405 06/23/20 0500  PROCALCITON <0.10  --   --   --   --   --   --   --   WBC 11.9*  --   --    < > 8.1 6.2 8.9 8.2  LATICACIDVEN  --  1.9 1.4  --   --   --   --   --    < > = values in this interval not displayed.    Procedures/Operations  06/22/2020: Endotracheal intubation>> 06/23/2020 06/22/2020: Bronchoscopy 06/22/2020: Right IJ CVC placement       [name], AGACNP-BC [facility] Prefer epic messenger for cross cover needs If after hours, please call E-link  Bradly Bienenstock July 09, 2020, 2:15 PM

## 2020-07-11 NOTE — Progress Notes (Signed)
SLP Cancellation Note  Patient Details Name: Benjamin Robertson MRN: [id number] DOB: [birth year]   Cancelled treatment:       Reason Eval/Treat Not Completed:  (Pt is now comofort care, will discharge orders for Bedside Swallow.)   Fintan Grater B. Rutherford Nail M.S., CCC-SLP, [place] [facility] [phone]  Stormy Fabian 2020-07-17, 10:37 AM

## 2020-07-11 NOTE — Progress Notes (Signed)
Called to provide support for fa fairly large family presence. Family understandably and openly emotional. Wife talked about their 56yr marriage and how blessed she and her family was. She also shared her faith and how it has and will carry her and the family forward. I am oncall so asked to be paged if additional support is needed.

## 2020-07-11 NOTE — Plan of Care (Signed)
End of shift summary:   Morphine gtt titrated for comfort. Ativan x1. Pt resting quietly with eyes closed. Wife at bedside.   Problem: Clinical Measurements: Goal: Ability to maintain clinical measurements within normal limits will improve Outcome: Progressing Goal: Will remain free from infection Outcome: Progressing Goal: Diagnostic test results will improve Outcome: Progressing Goal: Respiratory complications will improve Outcome: Progressing Goal: Cardiovascular complication will be avoided Outcome: Progressing   Problem: Pain Managment: Goal: General experience of comfort will improve Outcome: Progressing   Problem: Coping: Goal: Level of anxiety will decrease Outcome: Progressing   Problem: Skin Integrity: Goal: Risk for impaired skin integrity will decrease Outcome: Progressing   Problem: Safety: Goal: Ability to remain free from injury will improve Outcome: Progressing

## 2020-07-11 NOTE — Consult Note (Signed)
Consultation Note Date: 2020/07/16   Patient Name: Benjamin Robertson  DOB: [birth year]  MRN: [id number]  Age / Sex: 70 y.o., male  PCP: Venia Carbon, MD Referring Physician: Jacky Kindle, MD  Reason for Consultation: Establishing goals of care, Hospice Evaluation and Psychosocial/spiritual support  HPI/Patient Profile: 70 y.o. male  with past medical history of COPD, history of tobacco use, CAD status post CABG x3 after PCI x2, HTN, depression/anxiety, admitted on 07/07/2020 with community-acquired pneumonia, current smoker 1/2 pack/day.   Clinical Assessment and Goals of Care: Benjamin Robertson has been transitioned to full comfort care per critical care medicine team.  He has been transferred to the floor under comfort care.  He has continuous infusion of morphine, and appears comfortable, but actively dying.  Benjamin Robertson is surrounded by his family including his wife, his sons Benjamin Robertson and Benjamin Robertson in their lives, and his sister.  We talked about his comfort and symptom management.  We talked about continuous infusion of morphine and respiratory status.  We also talked about limited time.  We talked about the use of oxygen prolonging life.  Family is agreeable to unburden Benjamin Robertson from oxygen which is keeping him here artificially.  Prayer offered.   Conference with attending, bedside nursing staff, transitional care team related patient condition, needs, comfort measures.  HCPOA   NEXT OF KIN -wife, Benjamin Robertson.  Sons Benjamin Robertson and Benjamin Robertson.   SUMMARY OF RECOMMENDATIONS   Full comfort care End-of-life order set implemented Prognosis is hours, anticipate in-hospital death  Code Status/Advance Care Planning:  DNR  Symptom Management:   End-of-life order set implemented  Palliative Prophylaxis:   Frequent Pain Assessment and Palliative Wound Care  Additional Recommendations (Limitations, Scope,  Preferences):  Full Comfort Care  Psycho-social/Spiritual:   Desire for further Chaplaincy support:no  Additional Recommendations: Caregiving  Support/Resources and Grief/Bereavement Support  Prognosis:   Hours - Days, hours anticipated  Discharge Planning: Anticipated Hospital Death      Primary Diagnoses: Present on Admission: . CAP (community acquired pneumonia) . Chronic dyspnea . Cigarette smoker one half pack a day or less . COPD (chronic obstructive pulmonary disease) ([place]) . Statin myopathy . Essential hypertension   I have reviewed the medical record, interviewed the patient and family, and examined the patient. The following aspects are pertinent.  Past Medical History:  Diagnosis Date  . Arthritis    intermittent  . CAD in native artery 1994, 2001   a) 2001 Exertional Angina --> Myoview: Inf infarct & Ant ischemia --> Cath: 95% pLAD, 99% D1, 80% RI & Cx-OM1&2 OK , & patent RCA stent --> CABG: b) Cath 2003: patent Grafts & stents; c) 07/2014: PCI-Ost LCx (not bypassed) - Xience DES 3.0 x 15 (3.6 mm); d) 11/16/2019 (NSTEMI) -> 100% RCA ISR/T (Res.Onyx DES 2.5 x 30 - 2.8 mm) & 80% ost LCx ISR - dLM-OstLCx (Res Onyx 3.5 x 15 - 3.8 mm)  . Childhood asthma   . Hemorrhoids    history of  . History of ST elevation  myocardial infarction (STEMI) of inferior wall 1994; 1997   PCI of RCA - redo PCI BMS ML Vision 3.0 mm x 25 mm to RCA in 1997  . Hyperlipidemia LDL goal <70    At goal by Most recent labs October 2013: TC 123, TG 83, HDL 81, LDL 65  . Hypertension   . Ischemic cardiomyopathy 1994, 2003   Echo February 2014: EF 40-45%; moderate HK of anterolateral myocardium.; Grade 1 diastolic dysfunction  . Kidney stones    3 times had lithrotripsy  . Obesity (BMI 30.0-34.9)   . Pneumonia 2014  . S/P CABG x 3 2001   LIMA-LAD, SVG-D1l, SVG-(RI) - patent by Cath in 2003, RCA & LCx not Grafted   Social History   Socioeconomic History  . Marital status: Married     Spouse name: Not on file  . Number of children: 2  . Years of education: Not on file  . Highest education level: Not on file  Occupational History  . 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 Smoking status: Current Every Day Smoker    Packs/day: 0.25    Years: 48.00    Pack years: 12.00    Types: Cigarettes  . Smokeless tobacco: Never Used  . Tobacco comment: Was not able to maintain smoking cessation, -no longer has Chantix available.  Substance and Sexual Activity  . Alcohol use: No  . Drug use: No  . Sexual activity: Never  Other Topics Concern  . Not on file  Social History Narrative   Married father of 2 with one grandchild. By his wife.   Continues to smoke one half pack a day.   Very physically active at work but no routine exercise.      No living will    Wife would make decisions for him if needed. Sons would be alternates   Would accept resuscitation   Doesn't want prolonged machines or intervention   Social Determinants of Health   Financial Resource Strain: Not on file  Food Insecurity: Not on file  Transportation Needs: Not on file  Physical Activity: Not on file  Stress: Not on file  Social Connections: Not on file   Family History  Problem Relation Age of Onset  . Heart attack Mother   . Heart disease Mother   . Stroke Sister        during cancer Rx  . Lymphoma Sister   . Lung cancer Sister    Scheduled Meds: Continuous Infusions: . sodium chloride    . morphine 10 mg/hr (2020-07-07 0221)   PRN Meds:.acetaminophen **OR** acetaminophen, antiseptic oral rinse, glycopyrrolate **OR** glycopyrrolate **OR** glycopyrrolate, haloperidol **OR** haloperidol **OR** haloperidol lactate, LORazepam, morphine, ondansetron **OR** ondansetron (ZOFRAN) IV, polyvinyl alcohol Medications Prior to Admission:  Prior to Admission medications   Medication Sig Start Date End Date Taking? Authorizing Provider  albuterol (VENTOLIN HFA)  108 (90 Base) MCG/ACT inhaler Inhale 2 puffs into the lungs every 6 (six) hours as needed for wheezing or shortness of breath. 10/06/19  Yes Venia Carbon, MD  clopidogrel (PLAVIX) 75 MG tablet Take 1 tablet (75 mg total) by mouth daily. 02/26/20  Yes Leonie Man, MD  DULoxetine (CYMBALTA) 60 MG capsule Take 1 capsule (60 mg total) by mouth daily. 05/07/20  Yes Venia Carbon, MD  fluticasone (FLONASE) 50 MCG/ACT nasal spray Place 2 sprays into both nostrils daily. 05/19/20  Yes [provider]  guaiFENesin ([place]) 600 MG 12  hr tablet Take 300 mg by mouth 2 (two) times daily as needed.   Yes [provider]  Magnesium Oxide 250 MG TABS Take 1 tablet by mouth daily.    Yes [provider]  nitroGLYCERIN (NITROLINGUAL) 0.4 MG/SPRAY spray PLACE 1 SPRAY UNDER THE TONGUE EVERY 5 (FIVE) MINUTES X 3 DOSES AS NEEDED FOR CHEST PAIN. 05/31/20  Yes Leonie Man, MD  pantoprazole (PROTONIX) 40 MG tablet Take 1 tablet (40 mg total) by mouth daily. 05/07/20  Yes Venia Carbon, MD  predniSONE (DELTASONE) 5 MG tablet Take 5 mg by mouth daily. 02/18/20  Yes [provider]  TRELEGY ELLIPTA 100-62.5-25 MCG/INH AEPB Inhale 1 puff into the lungs daily. 02/18/20  Yes [provider]  valsartan (DIOVAN) 80 MG tablet Take 1 tablet (80 mg total) by mouth daily. 05/17/20  Yes Leonie Man, MD   Allergies  Allergen Reactions  . Brilinta [Ticagrelor] Shortness Of Breath    Having breathing issues  . Advicor [Niacin-Lovastatin Er] Other (See Comments)    Headache, possibly other reactions  . Lipitor [Atorvastatin] Other (See Comments)    Headache, possibly other reactions  . Pravachol [Pravastatin Sodium] Other (See Comments)    Headache, possibly other reactions  . Sulfa Antibiotics Other (See Comments)    Headache, possibly other reactions  . Repatha [Evolocumab] Other (See Comments)    Malaise, breathing issues  . Rosuvastatin Other (See Comments)     40  MG  TABLET--EXTREME FATIGUE  , MUSCLE  PAIN    Review of Systems  Unable to perform ROS: Patient unresponsive    Physical Exam Vitals and nursing note reviewed.  Constitutional:      General: He is not in acute distress.    Appearance: He is ill-appearing.  HENT:     Head: Atraumatic.  Cardiovascular:     Rate and Rhythm: Normal rate.  Pulmonary:     Effort: No respiratory distress.     Comments: Slow shallow breathing Skin:    General: Skin is warm and dry.     Comments: Red face     Vital Signs: BP (!) 82/47 (BP Location: Left Arm)   Pulse 83   Temp 98 F (36.7 C) (Oral)   Resp 19   Ht 5\' 8"  (1.727 m)   Wt 75.5 kg   SpO2 (!) 83%   BMI 25.31 kg/m  Pain Scale: PAINAD   Pain Score: 0-No pain   SpO2: SpO2: (!) 83 % O2 Device:SpO2: (!) 83 % O2 Flow Rate: .O2 Flow Rate (L/min): 13 L/min  IO: Intake/output summary:   Intake/Output Summary (Last 24 hours) at 2020-07-02 1401 Last data filed at 02-Jul-2020 0600 Gross per 24 hour  Intake 107.96 ml  Output 400 ml  Net -292.04 ml    LBM: Last BM Date: 06/22/20 Baseline Weight: Weight: 74.8 kg Most recent weight: Weight: 75.5 kg     Palliative Assessment/Data:   Flowsheet Rows   Flowsheet Row Most Recent Value  Intake Tab   Referral Department Hospitalist  Unit at Time of Referral Med/Surg Unit  Palliative Care Primary Diagnosis Pulmonary  Date Notified 02-Jul-2020  Palliative Care Type New Palliative care  Reason for referral Counsel Regarding Hospice, End of [place]  Date of Admission 06/12/2020  Date first seen by Palliative Care July 02, 2020  # of days Palliative referral response time 0 Day(s)  # of days IP prior to Palliative referral 7  Clinical Assessment   Palliative Performance Scale Score  10%  Pain Max last 24 hours Not able to report  Pain Min Last 24 hours Not able to report  Dyspnea Max Last 24 Hours Not able to report  Dyspnea Min Last 24 hours Not able to report  Psychosocial &  Spiritual Assessment   Palliative Care Outcomes       Time In: 1050 Time Out: 1140 Time Total: 50 minutes  Greater than 50%  of this time was spent counseling and coordinating care related to the above assessment and plan.  Signed by: Drue Novel, NP   Please contact Palliative Medicine Team phone at [phone] for questions and concerns.  For individual provider: See Shea Evans

## 2020-07-11 DEATH — deceased

## 2020-07-29 ENCOUNTER — Ambulatory Visit: Payer: Medicare Other | Admitting: Cardiology

## 2021-07-30 IMAGING — CT CT CHEST W/O CM
2 of 4 series · 15 of 36 positions shown, 18 images · non-contrast
Comparison: Abdomen CT on 07/22/2019; no prior chest CT

CLINICAL DATA: Cough and shortness of breath for 6 months. Previous
myocardial infarct. COPD.

EXAM:
CT CHEST WITHOUT CONTRAST
TECHNIQUE: Multidetector CT imaging of the chest was performed following the
standard protocol without IV contrast.

[Series 2: chest 2.00 · axial · 0.67mm/px · z∈[-1169,-897]mm · 12 of 162 slices shown, 15 images]
[im 13/162  mediastinal]
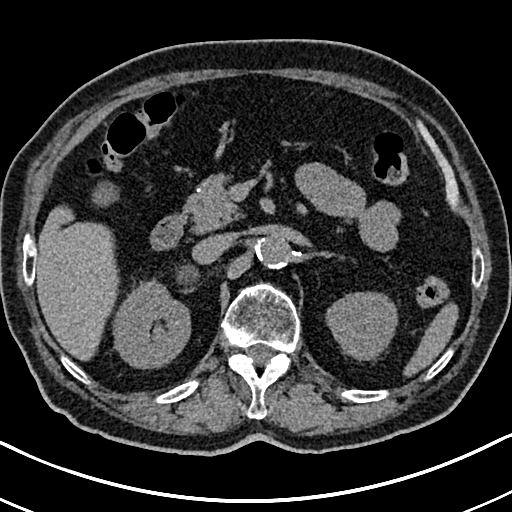
[im 13/162  lung]
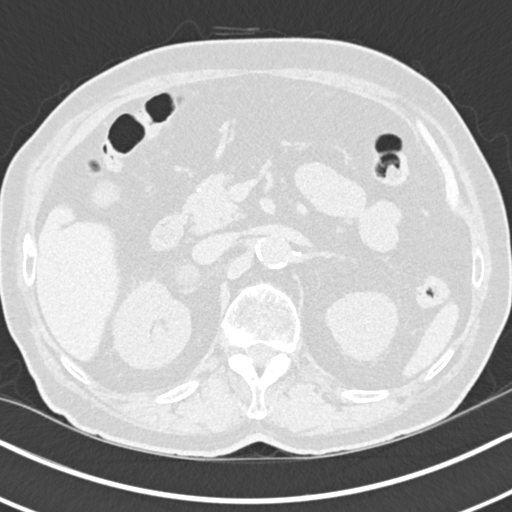
[im 25/162  lung]
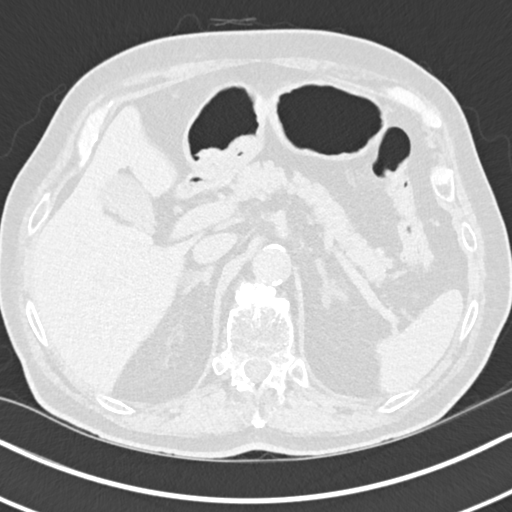
[im 38/162  lung]
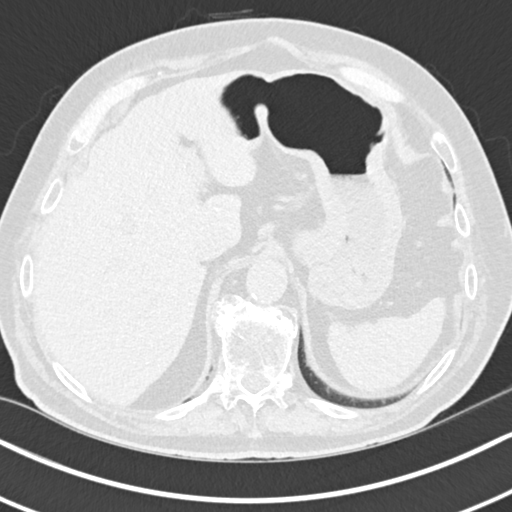
[im 50/162  lung]
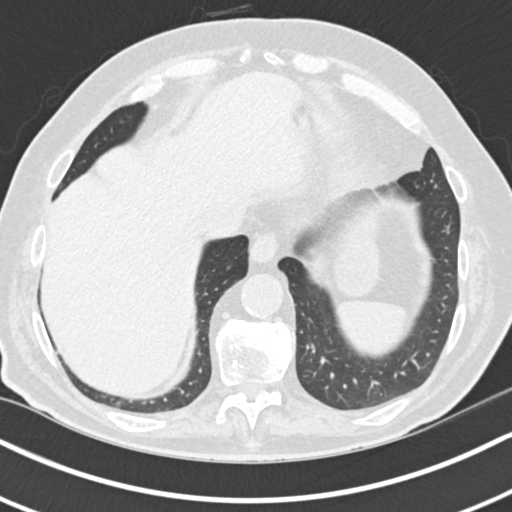
[im 62/162  mediastinal]
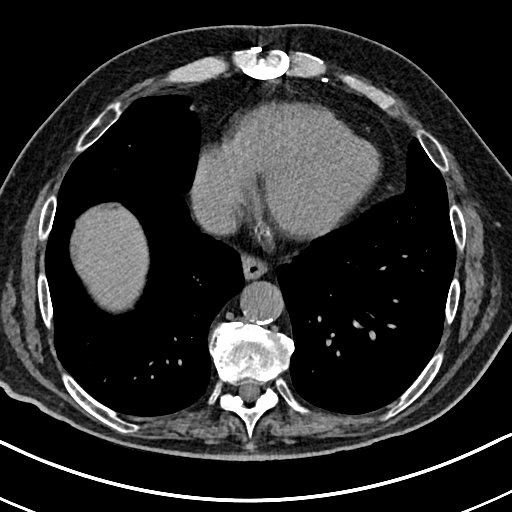
[im 62/162  lung]
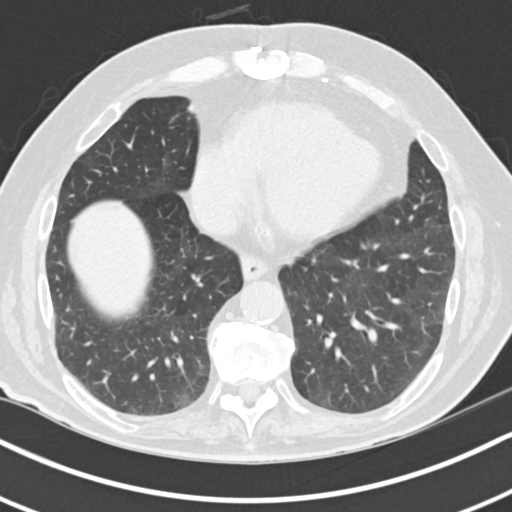
[im 75/162  lung]
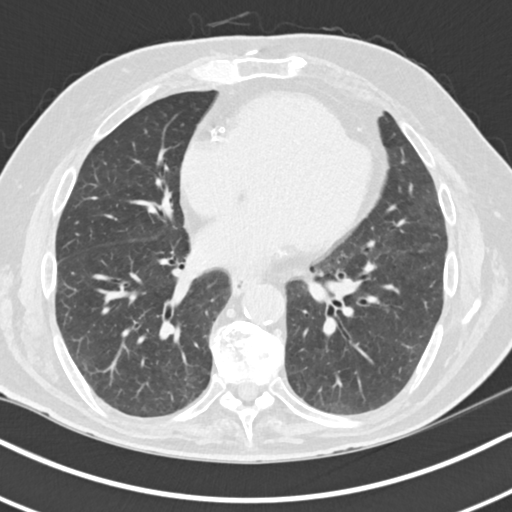
[im 87/162  lung]
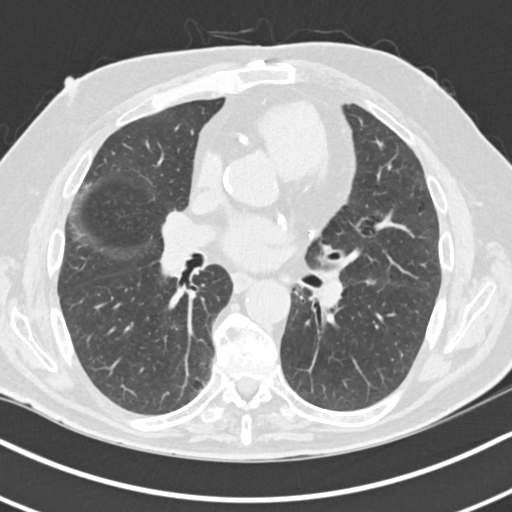
[im 100/162  lung]
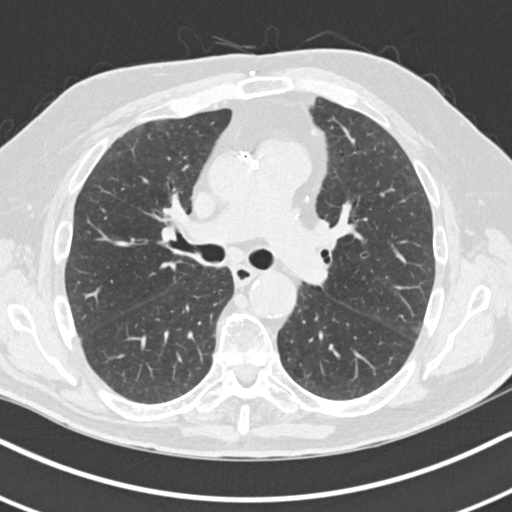
[im 112/162  mediastinal]
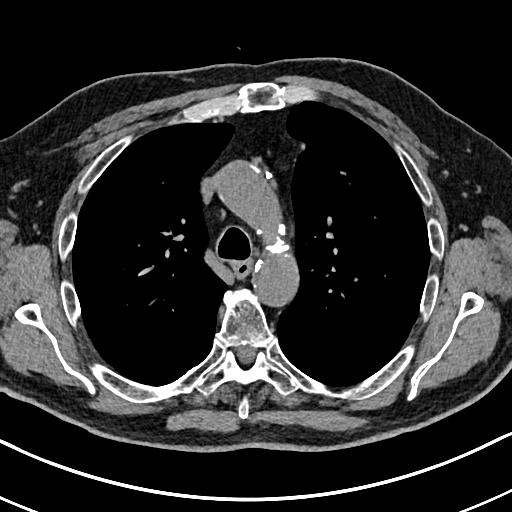
[im 112/162  lung]
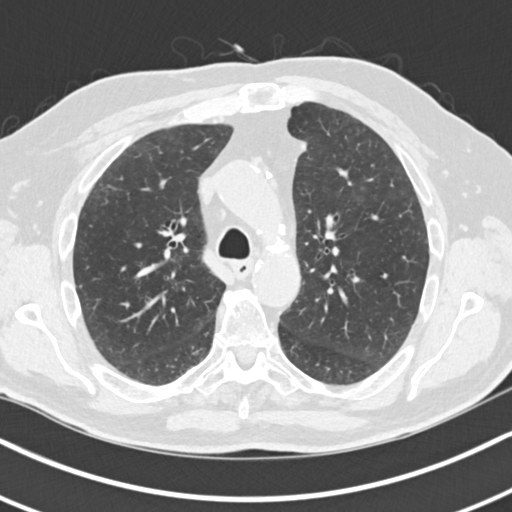
[im 124/162  lung]
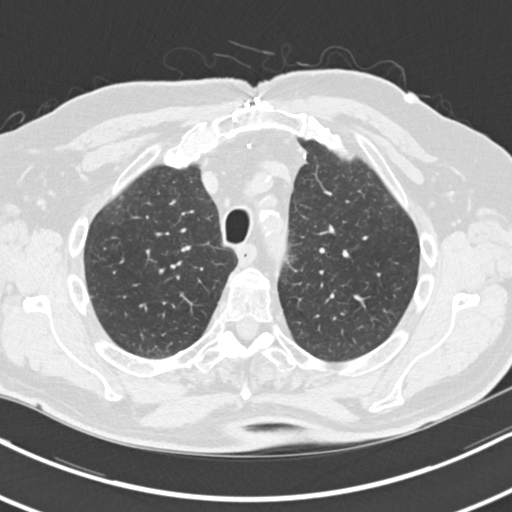
[im 137/162  lung]
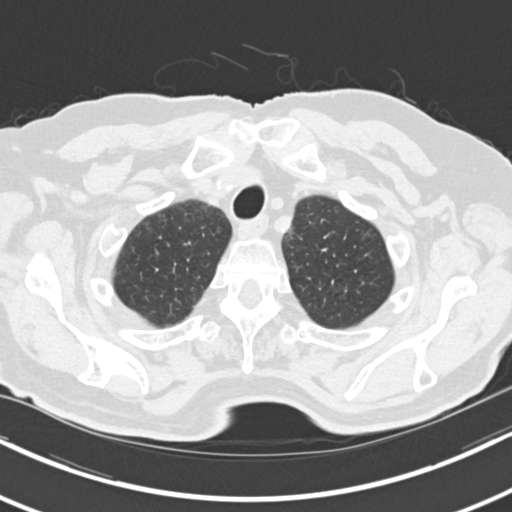
[im 149/162  lung]
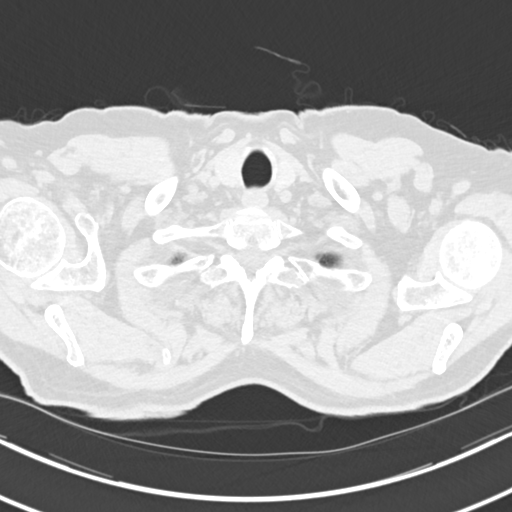

[Series 5: coronals chest 2.00 cor · coronal · 0.63mm/px · 3 of 152 slices shown]
[im 31/152  lung]
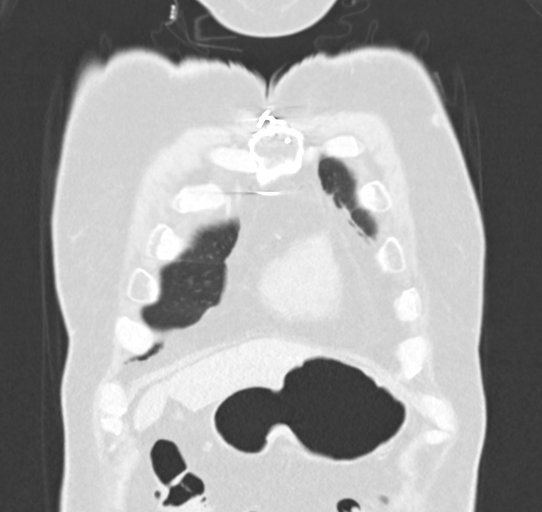
[im 61/152  lung]
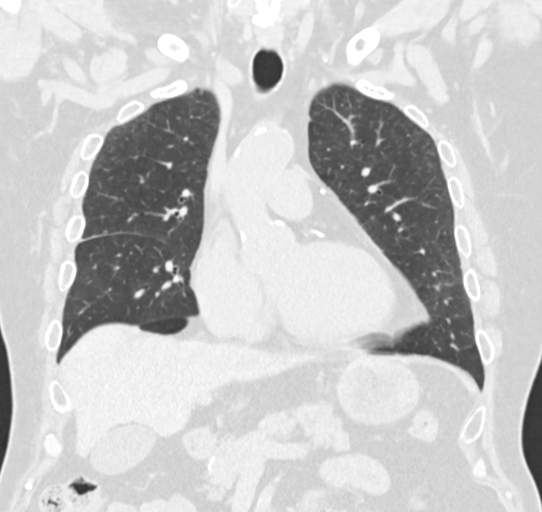
[im 91/152  lung]
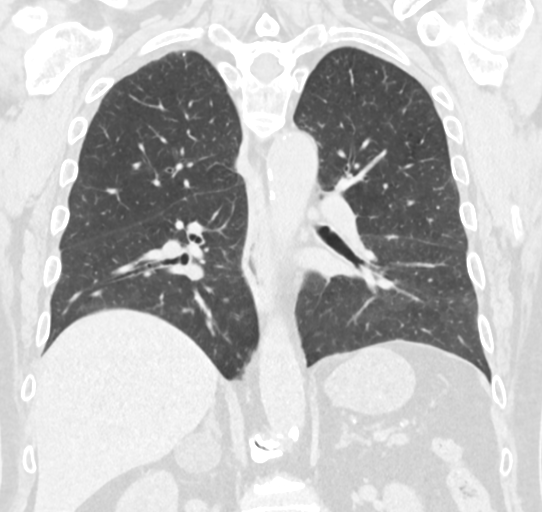

[15 of 36 positions shown; findings below may reference images not displayed]

FINDINGS: Cardiovascular: No acute findings. Aortic and coronary
atherosclerotic calcification noted. Prior CABG demonstrated.

Mediastinum/Nodes: No masses or pathologically enlarged lymph nodes
identified on this unenhanced exam.

Lungs/Pleura: Mild centrilobular emphysema noted. No suspicious
nodules or masses identified. No evidence of infiltrate or pleural
effusion.

Upper Abdomen: Stable 3.2 cm low-attenuation right adrenal mass,
consistent with benign adenoma.

Musculoskeletal:  No suspicious bone lesions.
IMPRESSION: No active disease within the thorax.

Mild centrilobular emphysema.

Stable benign right adrenal adenoma.

Aortic Atherosclerosis (GWTSS-1ZE.E) and Emphysema (GWTSS-J0S.3).

## 2021-11-14 IMAGING — CR DG CHEST 2V
3 series · 3 of 3 positions shown · non-contrast
Comparison: 11/14/2019.  Chest CT, 03/02/2020.

CLINICAL DATA: Pt reports 2 days of worsening sob. Hx of COPD.
Denies fever.Pt says he has phlegm down in his throat and air way
that he cannot clear out. Pt has a weak cough and denies cp. Pts
wife said he had a nebulizer treatment at home yesterday but it gave
no relief to his SOB.Hx. COPD, heart stents

EXAM:
CHEST - 2 VIEW

[chest lat (1 of 2)]
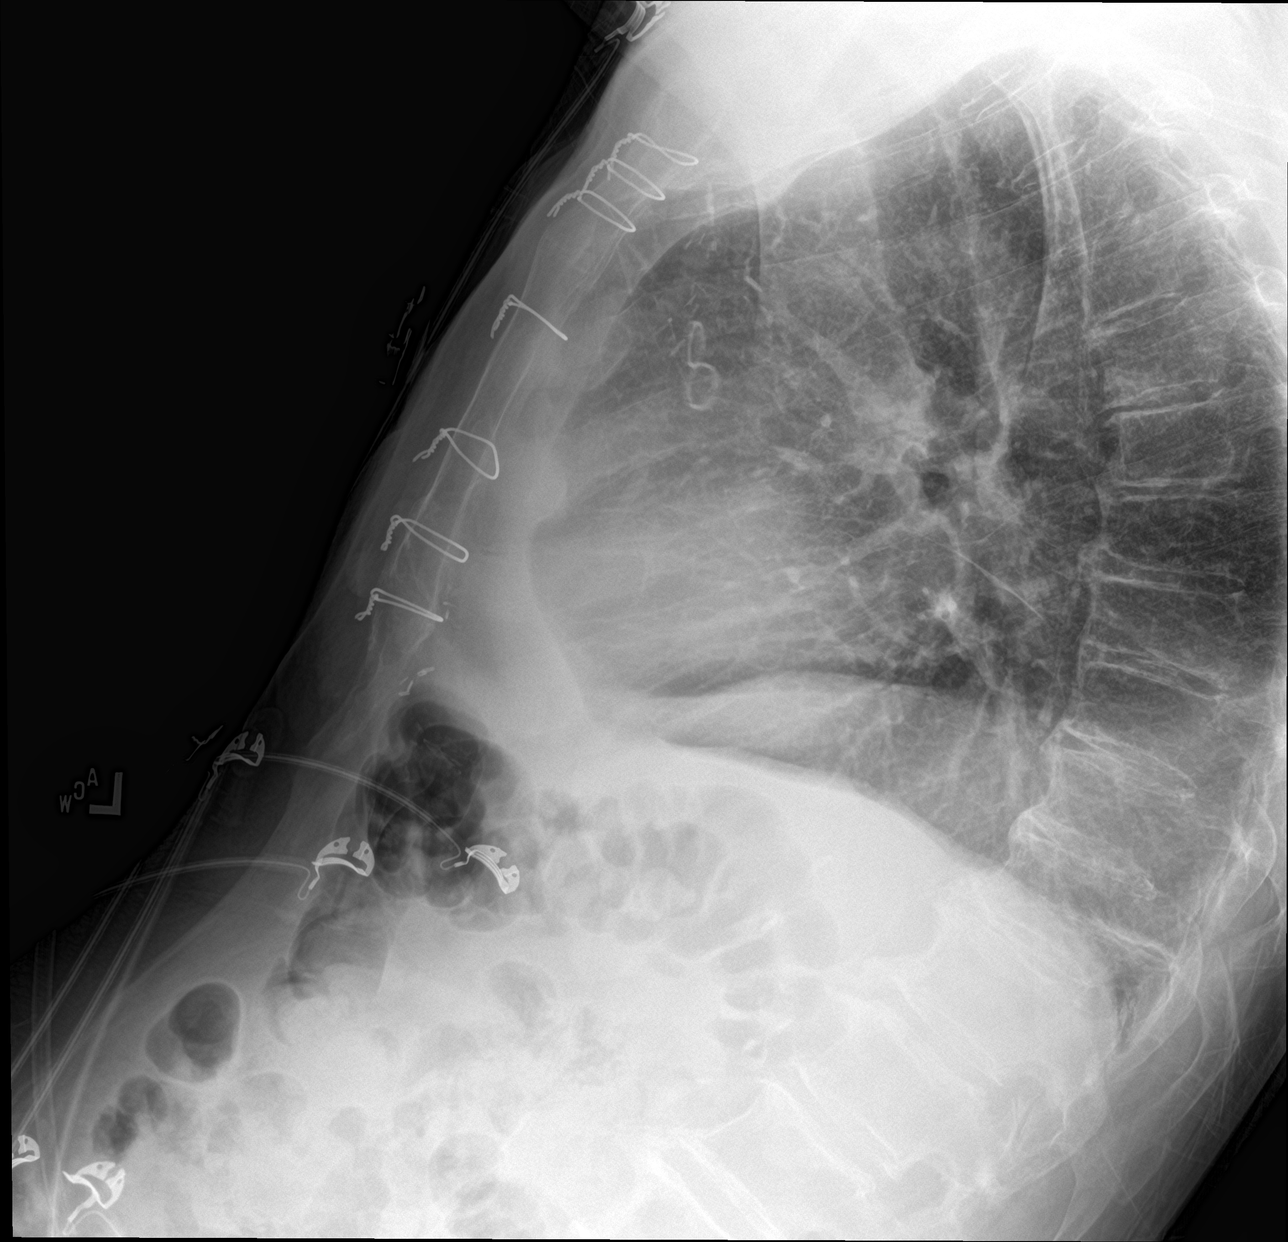

[chest lat (2 of 2)]
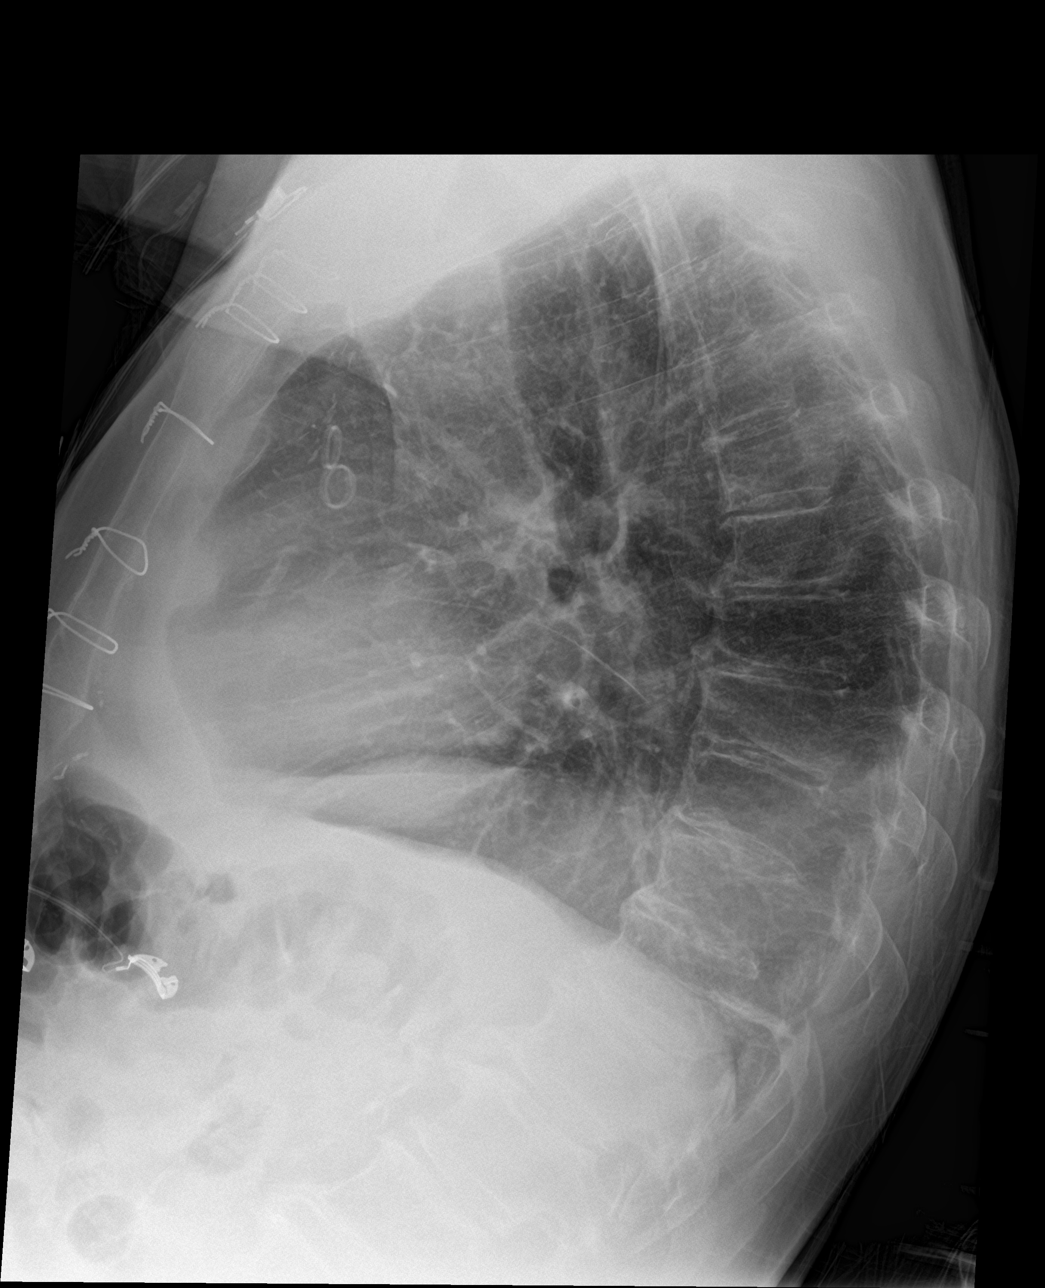

[chest ap]
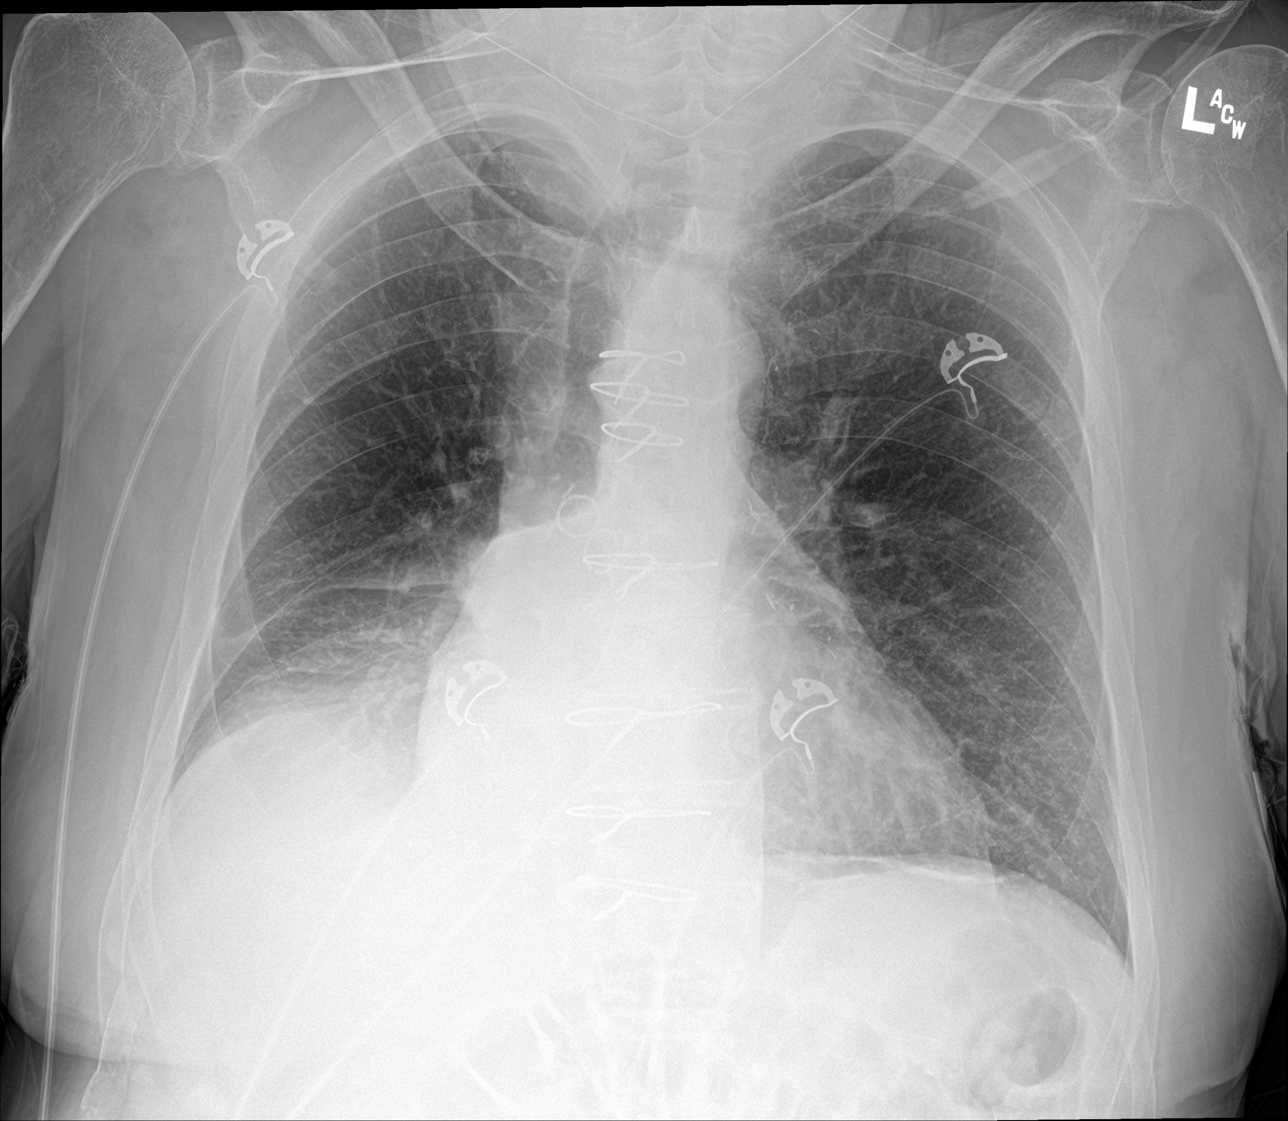

[3 of 3 positions shown; findings below may reference images not displayed]

FINDINGS: Stable changes from prior CABG surgery. Cardiac silhouette is normal
in size. No mediastinal or hilar masses. No evidence of adenopathy.

Opacity at the right lung base partly silhouettes the hemidiaphragm.
This is consistent with atelectasis or pneumonia. Lateral view
suggests a small effusion on the right. Linear opacity noted at the
posterior base of the left lower lobe consistent with atelectasis.
Remainder of the lungs is clear. No left pleural effusion. No
pneumothorax.

Skeletal structures are demineralized but grossly intact.
IMPRESSION: 1. Right lung base opacity consistent with either atelectasis or
pneumonia combination with a probable small effusion.
2. No other evidence of acute cardiopulmonary disease. No pulmonary
edema.
3. Stable changes from previous CABG surgery.

## 2021-11-19 IMAGING — DX DG CHEST 1V PORT
1 series · 1 of 1 positions shown · non-contrast
Comparison: Portable exam 9878 hours compared to 06/19/2020

CLINICAL DATA: Central line placement, Dobbhoff placement

EXAM:
PORTABLE CHEST 1 VIEW

[chest ap]
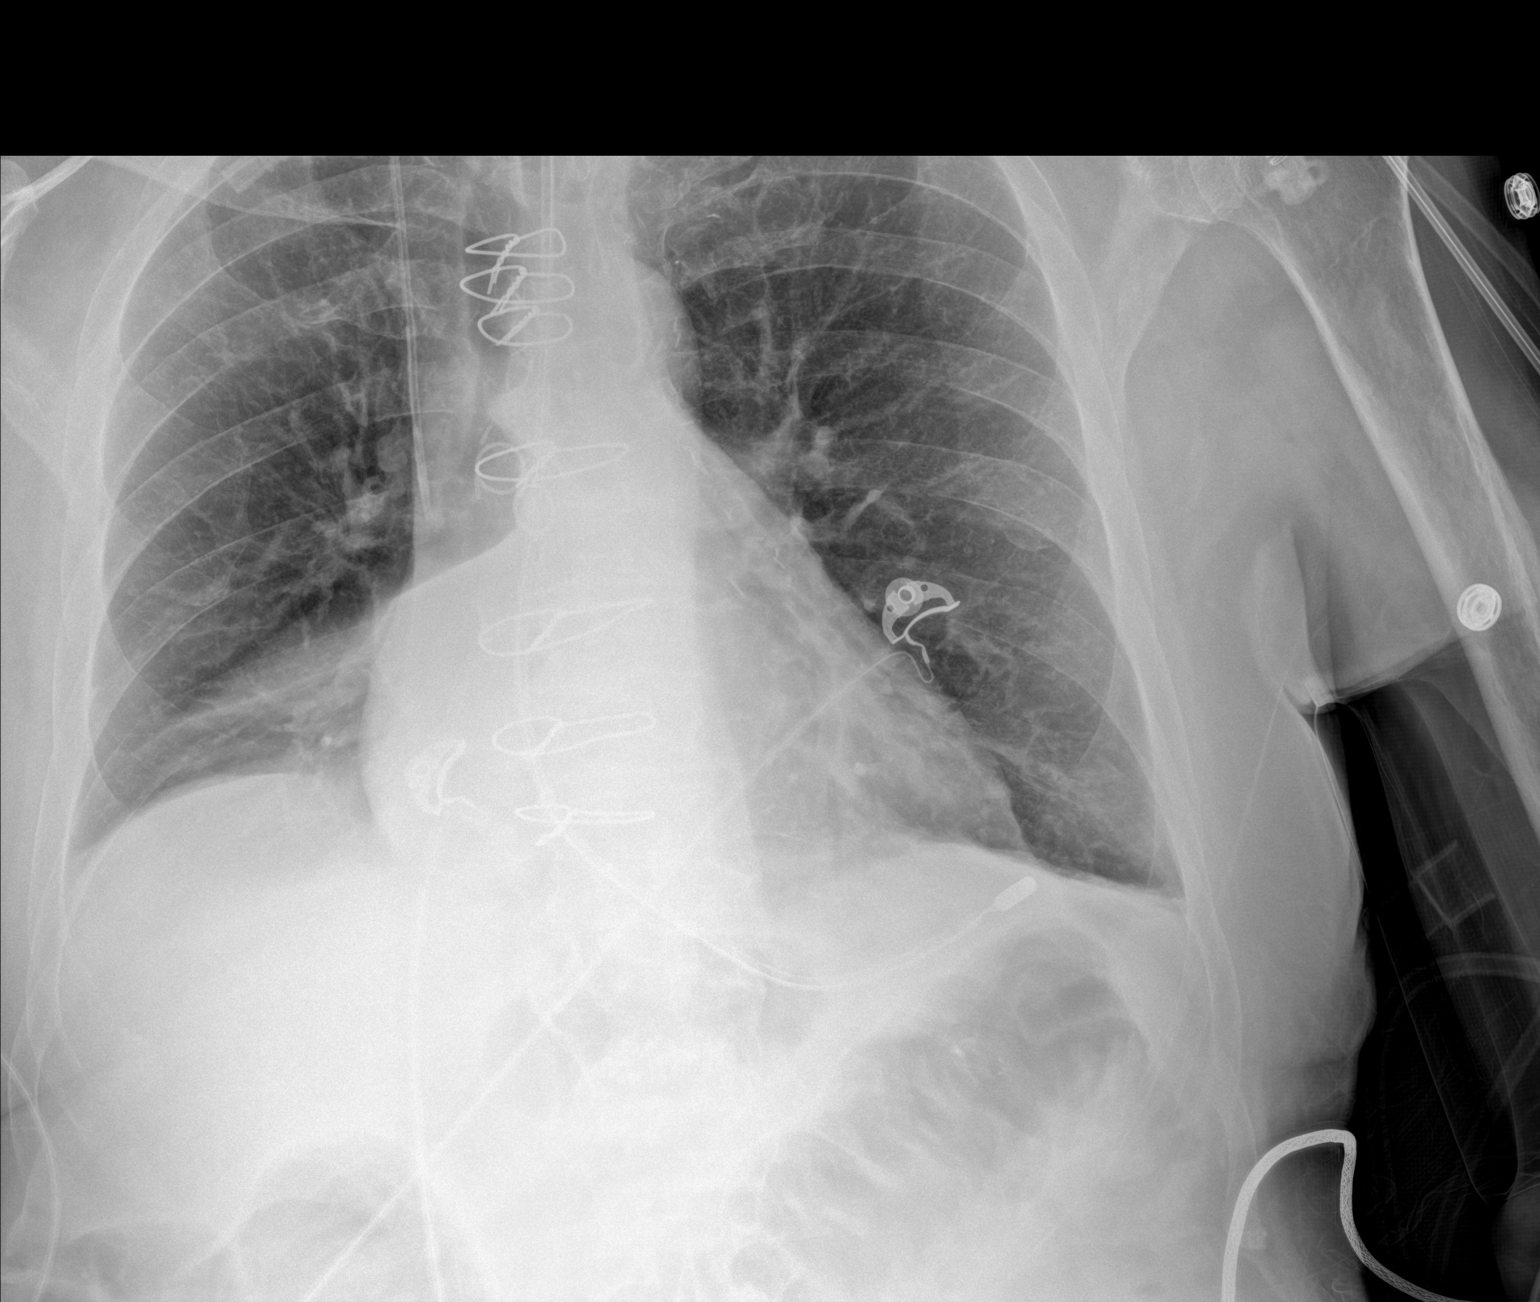

[1 of 1 positions shown; findings below may reference images not displayed]

FINDINGS: Tip of endotracheal tube projects 5.3 cm above carina.

Tip of feeding tube projects over gastric fundus.

RIGHT jugular central venous catheter with tip projecting over SVC.

Normal heart size post CABG.

Atherosclerotic calcification aorta.

Bibasilar atelectasis and probable small subpulmonic effusions.

No acute infiltrate or pneumothorax.

Osseous structures unremarkable.
IMPRESSION: No pneumothorax following central line placement.

Bibasilar atelectasis and probable small subpulmonic effusions.
